# Patient Record
Sex: Female | Born: 1940
Health system: Southern US, Community
[De-identification: ages and names within clinical notes are randomized; demographics above are authoritative.]

## PROBLEM LIST (undated history)

## (undated) DIAGNOSIS — M1990 Unspecified osteoarthritis, unspecified site: Secondary | ICD-10-CM

## (undated) DIAGNOSIS — E785 Hyperlipidemia, unspecified: Secondary | ICD-10-CM

## (undated) DIAGNOSIS — Q179 Congenital malformation of ear, unspecified: Secondary | ICD-10-CM

## (undated) DIAGNOSIS — I10 Essential (primary) hypertension: Secondary | ICD-10-CM

## (undated) DIAGNOSIS — F0390 Unspecified dementia without behavioral disturbance: Secondary | ICD-10-CM

## (undated) HISTORY — DX: Hyperlipidemia, unspecified: E78.5

## (undated) HISTORY — PX: CYSTOSCOPY WITH STENT PLACEMENT: SHX5790

## (undated) HISTORY — PX: CHOLECYSTECTOMY: SHX55

## (undated) HISTORY — PX: APPENDECTOMY: SHX54

---

## 2005-04-20 LAB — HM PAP SMEAR: HM Pap smear: NORMAL

## 2005-05-13 ENCOUNTER — Ambulatory Visit: Payer: Self-pay | Admitting: Family Medicine

## 2005-05-19 ENCOUNTER — Ambulatory Visit: Payer: Self-pay | Admitting: Family Medicine

## 2005-12-29 ENCOUNTER — Ambulatory Visit: Payer: Self-pay | Admitting: General Surgery

## 2006-11-26 ENCOUNTER — Ambulatory Visit: Payer: Self-pay | Admitting: Family Medicine

## 2007-12-23 ENCOUNTER — Ambulatory Visit: Payer: Self-pay | Admitting: Family Medicine

## 2008-12-27 ENCOUNTER — Ambulatory Visit: Payer: Self-pay | Admitting: Family Medicine

## 2010-08-19 ENCOUNTER — Ambulatory Visit: Payer: Self-pay | Admitting: Family Medicine

## 2010-09-09 ENCOUNTER — Ambulatory Visit: Payer: Self-pay | Admitting: Family Medicine

## 2011-03-11 ENCOUNTER — Ambulatory Visit: Payer: Self-pay | Admitting: Family Medicine

## 2012-05-05 ENCOUNTER — Ambulatory Visit: Payer: Self-pay | Admitting: Family Medicine

## 2012-07-02 ENCOUNTER — Ambulatory Visit: Payer: Self-pay | Admitting: Family Medicine

## 2013-07-04 ENCOUNTER — Ambulatory Visit: Payer: Self-pay | Admitting: Family Medicine

## 2013-07-11 LAB — HM DEXA SCAN

## 2013-07-13 ENCOUNTER — Ambulatory Visit: Payer: Self-pay | Admitting: Family Medicine

## 2014-02-23 ENCOUNTER — Ambulatory Visit: Payer: Self-pay | Admitting: Family Medicine

## 2014-08-04 LAB — LIPID PANEL
CHOLESTEROL: 163 mg/dL (ref 0–200)
HDL: 59 mg/dL (ref 35–70)
LDL CALC: 81 mg/dL
LDl/HDL Ratio: 1.4
Triglycerides: 116 mg/dL (ref 40–160)

## 2014-08-04 LAB — HEPATIC FUNCTION PANEL
ALK PHOS: 42 U/L (ref 25–125)
ALT: 13 U/L (ref 7–35)
AST: 12 U/L — AB (ref 13–35)
BILIRUBIN, TOTAL: 0.2 mg/dL

## 2014-08-04 LAB — CBC AND DIFFERENTIAL
HCT: 38 % (ref 36–46)
Hemoglobin: 12.8 g/dL (ref 12.0–16.0)
Neutrophils Absolute: 63 /uL
PLATELETS: 194 10*3/uL (ref 150–399)
WBC: 7.7 10*3/mL

## 2014-08-04 LAB — BASIC METABOLIC PANEL
BUN: 36 mg/dL — AB (ref 4–21)
Creatinine: 1.6 mg/dL — AB (ref <=1.1)
Glucose: 123 mg/dL
POTASSIUM: 4.2 mmol/L (ref 3.4–5.3)
Sodium: 140 mmol/L (ref 137–147)

## 2014-08-04 LAB — TSH: TSH: 1.7 u[IU]/mL (ref <=5.90)

## 2014-08-14 ENCOUNTER — Ambulatory Visit: Payer: Self-pay | Admitting: Family Medicine

## 2014-10-04 ENCOUNTER — Ambulatory Visit: Payer: Self-pay | Admitting: Nephrology

## 2014-11-07 DIAGNOSIS — R634 Abnormal weight loss: Secondary | ICD-10-CM | POA: Insufficient documentation

## 2014-11-07 DIAGNOSIS — Z800 Family history of malignant neoplasm of digestive organs: Secondary | ICD-10-CM | POA: Diagnosis not present

## 2014-11-07 DIAGNOSIS — R1030 Lower abdominal pain, unspecified: Secondary | ICD-10-CM | POA: Diagnosis not present

## 2014-11-16 DIAGNOSIS — I10 Essential (primary) hypertension: Secondary | ICD-10-CM | POA: Diagnosis not present

## 2014-11-16 DIAGNOSIS — N183 Chronic kidney disease, stage 3 (moderate): Secondary | ICD-10-CM | POA: Diagnosis not present

## 2014-11-16 DIAGNOSIS — M1990 Unspecified osteoarthritis, unspecified site: Secondary | ICD-10-CM | POA: Diagnosis not present

## 2014-11-16 DIAGNOSIS — I714 Abdominal aortic aneurysm, without rupture: Secondary | ICD-10-CM | POA: Diagnosis not present

## 2014-11-16 DIAGNOSIS — I701 Atherosclerosis of renal artery: Secondary | ICD-10-CM | POA: Diagnosis not present

## 2014-11-21 ENCOUNTER — Ambulatory Visit: Payer: Self-pay | Admitting: Vascular Surgery

## 2014-11-21 DIAGNOSIS — F17200 Nicotine dependence, unspecified, uncomplicated: Secondary | ICD-10-CM | POA: Diagnosis not present

## 2014-11-21 DIAGNOSIS — I129 Hypertensive chronic kidney disease with stage 1 through stage 4 chronic kidney disease, or unspecified chronic kidney disease: Secondary | ICD-10-CM | POA: Diagnosis not present

## 2014-11-21 DIAGNOSIS — N289 Disorder of kidney and ureter, unspecified: Secondary | ICD-10-CM | POA: Diagnosis not present

## 2014-11-21 DIAGNOSIS — I701 Atherosclerosis of renal artery: Secondary | ICD-10-CM | POA: Diagnosis not present

## 2014-11-21 DIAGNOSIS — N183 Chronic kidney disease, stage 3 (moderate): Secondary | ICD-10-CM | POA: Diagnosis not present

## 2014-11-21 DIAGNOSIS — E780 Pure hypercholesterolemia: Secondary | ICD-10-CM | POA: Diagnosis not present

## 2014-11-21 DIAGNOSIS — I10 Essential (primary) hypertension: Secondary | ICD-10-CM | POA: Diagnosis not present

## 2014-11-21 LAB — BASIC METABOLIC PANEL
ANION GAP: 7 (ref 7–16)
BUN: 29 mg/dL — AB (ref 7–18)
CREATININE: 1.48 mg/dL — AB (ref 0.60–1.30)
Calcium, Total: 8.9 mg/dL (ref 8.5–10.1)
Chloride: 110 mmol/L — ABNORMAL HIGH (ref 98–107)
Co2: 24 mmol/L (ref 21–32)
EGFR (African American): 44 — ABNORMAL LOW
EGFR (Non-African Amer.): 37 — ABNORMAL LOW
Glucose: 91 mg/dL (ref 65–99)
Osmolality: 287 (ref 275–301)
POTASSIUM: 3.9 mmol/L (ref 3.5–5.1)
SODIUM: 141 mmol/L (ref 136–145)

## 2014-11-27 DIAGNOSIS — R399 Unspecified symptoms and signs involving the genitourinary system: Secondary | ICD-10-CM | POA: Diagnosis not present

## 2014-11-27 DIAGNOSIS — Z23 Encounter for immunization: Secondary | ICD-10-CM | POA: Diagnosis not present

## 2014-11-27 DIAGNOSIS — F17200 Nicotine dependence, unspecified, uncomplicated: Secondary | ICD-10-CM | POA: Diagnosis not present

## 2014-11-27 DIAGNOSIS — N390 Urinary tract infection, site not specified: Secondary | ICD-10-CM | POA: Diagnosis not present

## 2014-11-27 DIAGNOSIS — I10 Essential (primary) hypertension: Secondary | ICD-10-CM | POA: Diagnosis not present

## 2014-12-01 DIAGNOSIS — N183 Chronic kidney disease, stage 3 (moderate): Secondary | ICD-10-CM | POA: Diagnosis not present

## 2014-12-01 DIAGNOSIS — I10 Essential (primary) hypertension: Secondary | ICD-10-CM | POA: Diagnosis not present

## 2014-12-25 DIAGNOSIS — I701 Atherosclerosis of renal artery: Secondary | ICD-10-CM | POA: Diagnosis not present

## 2014-12-25 DIAGNOSIS — I150 Renovascular hypertension: Secondary | ICD-10-CM | POA: Diagnosis not present

## 2014-12-25 DIAGNOSIS — I714 Abdominal aortic aneurysm, without rupture: Secondary | ICD-10-CM | POA: Diagnosis not present

## 2014-12-25 DIAGNOSIS — E785 Hyperlipidemia, unspecified: Secondary | ICD-10-CM | POA: Diagnosis not present

## 2015-01-01 DIAGNOSIS — F329 Major depressive disorder, single episode, unspecified: Secondary | ICD-10-CM | POA: Diagnosis not present

## 2015-01-01 DIAGNOSIS — M810 Age-related osteoporosis without current pathological fracture: Secondary | ICD-10-CM | POA: Diagnosis not present

## 2015-01-01 DIAGNOSIS — F411 Generalized anxiety disorder: Secondary | ICD-10-CM | POA: Diagnosis not present

## 2015-01-01 DIAGNOSIS — E780 Pure hypercholesterolemia: Secondary | ICD-10-CM | POA: Diagnosis not present

## 2015-01-01 DIAGNOSIS — I10 Essential (primary) hypertension: Secondary | ICD-10-CM | POA: Diagnosis not present

## 2015-02-02 DIAGNOSIS — I10 Essential (primary) hypertension: Secondary | ICD-10-CM | POA: Diagnosis not present

## 2015-02-02 DIAGNOSIS — N183 Chronic kidney disease, stage 3 (moderate): Secondary | ICD-10-CM | POA: Diagnosis not present

## 2015-02-25 NOTE — Op Note (Signed)
PATIENT NAME:  Sarah [name], Sarah [name] MR#:  [id number] DATE OF BIRTH:  1941/05/25  DATE OF PROCEDURE:  11/21/2014  PREOPERATIVE DIAGNOSES: 1.  Renal insufficiency.  2.  Hypertension.  3.  Renal artery stenosis.   POSTOPERATIVE DIAGNOSES:   1.  Renal insufficiency.  2.  Hypertension.  3.  Renal artery stenosis.   PROCEDURES PERFORMED:   1.  Abdominal aortogram.  2.  Right selective renal artery angiography.  3.  Percutaneous transluminal angioplasty and stent placement, right renal artery using a 4 x 15 Herculink stent post dilated to 5 mm.   SURGEON:  Katha Cabal, MD   SEDATION: Versed plus fentanyl. Continuous ECG, pulse oximetry and cardiopulmonary monitoring is performed throughout the entire procedure by the interventional radiology nurse, total sedation time is in 1 hour.   ACCESS: A 6 French sheath, right common femoral artery.   FLUOROSCOPY TIME: Was 10.5 minutes.   CONTRAST USED:  Isovue 45 mL.   INDICATIONS: Sarah [name] is a 73 year old woman who has experienced an episode of acute renal insufficiency. She has subsequently recovered the majority of her renal function, but has very mild to modest impairment. She also has chronic hypertension. Work-up of her episode demonstrated right renal artery stenosis. Risks and benefits have been reviewed for renal artery angiography and intervention. All questions answered. The patient has agreed to proceed.   DESCRIPTION OF PROCEDURE: The patient is taken to special procedures and placed in the supine position and after adequate sedation is achieved, both groins are prepped and draped in sterile fashion. Appropriate timeout is called.   Then 1% lidocaine is infiltrated in the soft tissue. Ultrasound is placed in a sterile sleeve. Common femoral artery is identified. It is echolucent and pulsatile indicating patency. Image is recorded for the permanent record. Under real-time visualization, access is obtained with a micropuncture needle,  microwire followed by micro sheath, J-wire followed by a 5 French sheath and 5 French pigtail catheter.   The pigtail catheter is positioned at the level of T12, and AP projection of the aorta is obtained. Bilateral renal arteries are noted, appears to be moderate to perhaps severe stenosis of the left renal artery; right renal artery is poorly visualized at this angle, and subsequently, a VS1 catheter is utilized to select the origin, which is actually somewhat dilated. Once this has been engaged a steep LAO projection is obtained, which splays out the renal artery quite nicely demonstrating there is a string sign approximately 1.5 to 1 cm distal to the origin. Beyond that stenosis the renal artery appears quite normal.   Subsequently the Glidewire and VS1 catheter are used to select the renal artery. The VS1 catheter is seated within the renal artery and subsequently a more formal renal artery angiography is obtained with several mL of contrast. A Magic torque wire is then inserted and the VS1 catheter is removed. Subsequently, a Thiells guiding sheath is advanced up and over the wire. A straight glide catheter is then advanced over the wire and the Magic torque wire exchanged for an 0.014 Spartacore. A 4 x 15 Herculink stent is then selected and advanced over the wire, across the lesion, 2 small puffs of contrast were used to position the stent and then stent was deployed without difficulty.   Follow-up imaging demonstrated excellent positioning of the stent, but somewhat under dilated  and therefore a 5 x 2 balloon was advanced over the wire, across the stent and a second inflation was made to 8 atmospheres  for approximately 30 seconds.   Follow-up imaging now demonstrates again the stent is in very good position; it was now more appropriately expanded. There is rapid flow of contrast filling the right kidney now. Nephrogram appears normal.   Guiding catheter wire is removed. Magic torque wire is advanced  through the sheath. Oblique view of the right groin is obtained. A StarClose device is deployed. There are no immediate complications.   INTERPRETATION: Initial views of the abdominal aorta demonstrate aneurysmal dilatation; there are diffuse calcifications and atherosclerotic changes but no hemodynamically significant stenoses. Left renal artery demonstrates approximately 50% to 60% stenosis, may be more based on the appearance, very calcified. The right renal artery demonstrates moderate aneurysmal dilatation, if you will, at the origin; however, within several mm, there is a string sign and subsequently normal renal artery distal to this.   After selecting the renal artery, stent is advanced across the lesion dilated. Follow-up imaging suggested that it is undersized and therefore it is post dilated to 5 mm. Follow-up imaging now demonstrates a well-positioned stent with good apposition to the wall.   SUMMARY: Successful angioplasty and stent placement, right renal artery.    ____________________________ Katha Cabal, MD ggs:nt D: 11/21/2014 17:01:28 ET T: 11/21/2014 17:43:39 ET JOB#: [id number]  cc: Katha Cabal, MD, <Dictator> Murlean Iba, MD Richard L. Rosanna Randy, [place] MD ELECTRONICALLY SIGNED 11/29/2014 17:43

## 2015-02-26 DIAGNOSIS — R413 Other amnesia: Secondary | ICD-10-CM | POA: Diagnosis not present

## 2015-02-26 DIAGNOSIS — E559 Vitamin D deficiency, unspecified: Secondary | ICD-10-CM | POA: Diagnosis not present

## 2015-02-26 DIAGNOSIS — M1990 Unspecified osteoarthritis, unspecified site: Secondary | ICD-10-CM | POA: Diagnosis not present

## 2015-02-26 DIAGNOSIS — F329 Major depressive disorder, single episode, unspecified: Secondary | ICD-10-CM | POA: Diagnosis not present

## 2015-02-26 DIAGNOSIS — F411 Generalized anxiety disorder: Secondary | ICD-10-CM | POA: Diagnosis not present

## 2015-03-05 DIAGNOSIS — E559 Vitamin D deficiency, unspecified: Secondary | ICD-10-CM | POA: Insufficient documentation

## 2015-03-05 DIAGNOSIS — M1990 Unspecified osteoarthritis, unspecified site: Secondary | ICD-10-CM | POA: Insufficient documentation

## 2015-03-05 DIAGNOSIS — F411 Generalized anxiety disorder: Secondary | ICD-10-CM | POA: Insufficient documentation

## 2015-03-05 DIAGNOSIS — E785 Hyperlipidemia, unspecified: Secondary | ICD-10-CM | POA: Insufficient documentation

## 2015-03-05 DIAGNOSIS — R0989 Other specified symptoms and signs involving the circulatory and respiratory systems: Secondary | ICD-10-CM | POA: Insufficient documentation

## 2015-03-05 DIAGNOSIS — F17200 Nicotine dependence, unspecified, uncomplicated: Secondary | ICD-10-CM | POA: Insufficient documentation

## 2015-03-05 DIAGNOSIS — J309 Allergic rhinitis, unspecified: Secondary | ICD-10-CM | POA: Insufficient documentation

## 2015-03-05 DIAGNOSIS — M419 Scoliosis, unspecified: Secondary | ICD-10-CM | POA: Insufficient documentation

## 2015-03-05 DIAGNOSIS — F329 Major depressive disorder, single episode, unspecified: Secondary | ICD-10-CM | POA: Insufficient documentation

## 2015-03-05 DIAGNOSIS — R413 Other amnesia: Secondary | ICD-10-CM | POA: Insufficient documentation

## 2015-03-05 DIAGNOSIS — M810 Age-related osteoporosis without current pathological fracture: Secondary | ICD-10-CM | POA: Insufficient documentation

## 2015-03-05 DIAGNOSIS — I150 Renovascular hypertension: Secondary | ICD-10-CM | POA: Insufficient documentation

## 2015-03-05 DIAGNOSIS — K219 Gastro-esophageal reflux disease without esophagitis: Secondary | ICD-10-CM | POA: Insufficient documentation

## 2015-03-05 DIAGNOSIS — R634 Abnormal weight loss: Secondary | ICD-10-CM | POA: Insufficient documentation

## 2015-04-12 ENCOUNTER — Other Ambulatory Visit: Payer: Self-pay | Admitting: Family Medicine

## 2015-05-02 ENCOUNTER — Encounter: Payer: Self-pay | Admitting: Family Medicine

## 2015-05-02 ENCOUNTER — Ambulatory Visit (INDEPENDENT_AMBULATORY_CARE_PROVIDER_SITE_OTHER): Payer: Medicare Other | Admitting: Family Medicine

## 2015-05-02 VITALS — BP 160/78 | HR 80 | Temp 97.7°F | Resp 16 | Ht 58.25 in | Wt 88.4 lb

## 2015-05-02 DIAGNOSIS — R634 Abnormal weight loss: Secondary | ICD-10-CM | POA: Diagnosis not present

## 2015-05-02 DIAGNOSIS — F329 Major depressive disorder, single episode, unspecified: Secondary | ICD-10-CM

## 2015-05-02 DIAGNOSIS — F419 Anxiety disorder, unspecified: Secondary | ICD-10-CM

## 2015-05-02 DIAGNOSIS — F32A Depression, unspecified: Secondary | ICD-10-CM

## 2015-05-02 MED ORDER — MIRTAZAPINE 30 MG PO TABS: 30.0000 mg | ORAL_TABLET | Freq: Every day | ORAL | Status: DC

## 2015-05-02 MED ORDER — LORAZEPAM 1 MG PO TABS: ORAL_TABLET | ORAL | Status: DC

## 2015-05-02 NOTE — Progress Notes (Signed)
Patient ID: Sarah [name], female   DOB: 1941-03-20, 74 y.o.   MRN: [id number].       Patient: Sarah [name] Female    DOB: 03-18-1941   74 y.o.   MRN: [id number] Visit Date: 05/02/2015  Today's Provider: Wilhemena Durie, MD   Chief Complaint  Patient presents with  . Depression    follow-up MDD  . Weight Loss    follow-up Unintensional weight loss   Subjective:     Office Visit  02/26/15 MDD try wellbutrin, did not tolerate citalopram. May need to add Trazodone  Unintentional weight loss. GAD - advised to try cut down on use of Lorazepam.     HPI She does not feel any better on Wellbutrin and actually feels worse trying to cut down on lorazepam. He is probably been on this for 30 years. She feels as though she eats okay. Appetite is fine per patient.   Previous Medications   ALENDRONATE (FOSAMAX) 10 MG TABLET    Take by mouth.   AMLODIPINE (NORVASC) 10 MG TABLET    Take by mouth.   BUPROPION (WELLBUTRIN XL) 150 MG 24 HR TABLET    Take by mouth.   CALCIUM CARBONATE-VITAMIN D PO    Take by mouth.   CHOLECALCIFEROL (VITAMIN D3) 1000 UNITS CAPS    Take by mouth.   CHOLECALCIFEROL 1000 UNITS TABLET    Take by mouth.   CLOPIDOGREL (PLAVIX) 75 MG TABLET    Take by mouth.   CYANOCOBALAMIN 100 MCG TABLET    Take by mouth.   LISINOPRIL (PRINIVIL,ZESTRIL) 40 MG TABLET    Take by mouth.   LORAZEPAM (ATIVAN) 1 MG TABLET    Take by mouth.   LOVASTATIN (MEVACOR) 40 MG TABLET    Take by mouth.   MECLIZINE (ANTIVERT) 25 MG TABLET    Take by mouth.   MECLIZINE (ANTIVERT) 25 MG TABLET    Take by mouth.   MELOXICAM (MOBIC) 7.5 MG TABLET    TAKE ONE TABLET BY MOUTH ONCE DAILY AS NEEDED   NON FORMULARY    Take by mouth.   OMEPRAZOLE (PRILOSEC) 20 MG CAPSULE    Take by mouth.   PANTOPRAZOLE (PROTONIX) 40 MG TABLET    Take by mouth.   VITAMIN E 100 UNIT CAPSULE    Take by mouth.   VITAMIN E 100 UNITS TABS    Take by mouth.    Review of Systems  Constitutional: Negative.     HENT: Negative.   Eyes: Positive for visual disturbance.       To see close"  Respiratory: Positive for cough.   Cardiovascular: Negative.   Gastrointestinal: Negative.   Endocrine: Positive for heat intolerance.  Genitourinary: Negative.   Musculoskeletal:       Left shoulder, arthritis  Skin: Negative.   Allergic/Immunologic: Negative.   Neurological: Positive for dizziness.  Hematological: Bruises/bleeds easily.  Psychiatric/Behavioral: The patient is nervous/anxious.     History  Substance Use Topics  . Smoking status: Current Every Day Smoker -- 0.50 packs/day    Types: Cigarettes  . Smokeless tobacco: Not on file  . Alcohol Use: No   Objective:   BP 160/78 mmHg  Pulse 80  Temp(Src) 97.7 F (36.5 C) (Oral)  Resp 16  Ht 4' 10.25" (1.48 m)  Wt 88 lb 6.4 oz (40.098 kg)  BMI 18.31 kg/m2  Physical Exam  Constitutional: She is oriented to person, place, and time. She appears well-developed.  Cachectic appearing Latino female  in no acute distress.  HENT:  Head: Normocephalic and atraumatic.  Right Ear: External ear normal.  Left Ear: External ear normal.  Nose: Nose normal.  Mouth/Throat: Oropharynx is clear and moist.  Eyes: Conjunctivae and EOM are normal. Pupils are equal, round, and reactive to light.  Neck: Normal range of motion. Neck supple.  Cardiovascular: Normal rate, regular rhythm, normal heart sounds and intact distal pulses.   Pulmonary/Chest: Effort normal and breath sounds normal.  Abdominal: Soft. Bowel sounds are normal.  Neurological: She is alert and oriented to person, place, and time. She has normal reflexes.  Skin: Skin is warm and dry.  Psychiatric: She has a normal mood and affect. Her behavior is normal. Judgment normal.        Assessment & Plan:     Follow up:   1. Depression Refill provided. - LORazepam (ATIVAN) 1 MG tablet; Take 1/2 tablet twice daily  Dispense: 30 tablet; Refill: 5 Switch to mirtazapine 30 mg daily area stop  Wellbutrin. 2. Weight loss, unintentional Start Mirtazapine. 3 lbs weight loss since may.  - mirtazapine (REMERON) 30 MG tablet; Take 1 tablet (30 mg total) by mouth at bedtime.  Dispense: 30 tablet; Refill: 5  3. Acute anxiety Stable. - LORazepam (ATIVAN) 1 MG tablet; Take 1/2 tablet twice daily  Dispense: 30 tablet; Refill: 5 Obtain MMSE on next visit as I have difficulty signing if she has chronic anxiety or some memory loss. 4. Tobacco abuse Patient is strongly encouraged to discontinue this.      I have done the exam and reviewed the above chart and it is accurate to the best of my knowledge.   Richard Cranford Mon, MD  [facility]

## 2015-05-29 ENCOUNTER — Encounter: Payer: Self-pay | Admitting: Family Medicine

## 2015-05-29 ENCOUNTER — Ambulatory Visit (INDEPENDENT_AMBULATORY_CARE_PROVIDER_SITE_OTHER): Payer: Medicare Other | Admitting: Family Medicine

## 2015-05-29 VITALS — BP 110/62 | HR 62 | Temp 97.5°F | Resp 16 | Wt 87.0 lb

## 2015-05-29 DIAGNOSIS — R413 Other amnesia: Secondary | ICD-10-CM

## 2015-05-29 DIAGNOSIS — F0390 Unspecified dementia without behavioral disturbance: Secondary | ICD-10-CM | POA: Diagnosis not present

## 2015-05-29 DIAGNOSIS — F329 Major depressive disorder, single episode, unspecified: Secondary | ICD-10-CM | POA: Insufficient documentation

## 2015-05-29 DIAGNOSIS — F411 Generalized anxiety disorder: Secondary | ICD-10-CM | POA: Diagnosis not present

## 2015-05-29 DIAGNOSIS — E785 Hyperlipidemia, unspecified: Secondary | ICD-10-CM

## 2015-05-29 DIAGNOSIS — F32A Depression, unspecified: Secondary | ICD-10-CM

## 2015-05-29 DIAGNOSIS — K219 Gastro-esophageal reflux disease without esophagitis: Secondary | ICD-10-CM

## 2015-05-29 DIAGNOSIS — Z720 Tobacco use: Secondary | ICD-10-CM | POA: Diagnosis not present

## 2015-05-29 DIAGNOSIS — F17200 Nicotine dependence, unspecified, uncomplicated: Secondary | ICD-10-CM

## 2015-05-29 DIAGNOSIS — I150 Renovascular hypertension: Secondary | ICD-10-CM | POA: Diagnosis not present

## 2015-05-29 MED ORDER — DONEPEZIL HCL 5 MG PO TABS: 5.0000 mg | ORAL_TABLET | Freq: Every day | ORAL | Status: DC

## 2015-05-29 NOTE — Progress Notes (Signed)
Patient ID: Sarah [name], female   DOB: 1941/08/23, 74 y.o.   MRN: [id number]    Subjective:  HPI Pt is here to follow up for depression and memory loss. LOV d/c Wellbutrin and started Mirtazapine. [place] son reports that her memory is still about the same even taking the Mirtazapine. She is taking it every night and not having any side effects that she knows of. She reports that she is sleeping better at night.   Cognitive Testing - 6-CIT  Correct? Score   What year is it? yes 0 0 or 4  What month is it? yes 0 0 or 3  Memorize:    Sarah [name],  40,  [place],      What time is it? (within 1 hour) yes 0 0 or 3  Count backwards from 20 no 2 0, 2, or 4  Name the months of the year no 2 0, 2, or 4  Repeat name & address above no 10 0, 2, 4, 6, 8, or 10       TOTAL SCORE  14/28   Interpretation:  Abnormal- cognitive impairment  Normal (0-7) Abnormal (8-28)      Prior to Admission medications   Medication Sig Start Date End Date Taking? Authorizing Provider  alendronate (FOSAMAX) 10 MG tablet Take by mouth. 01/01/15  Yes Historical Provider, MD  amLODipine (NORVASC) 10 MG tablet Take by mouth. 08/01/14  Yes Historical Provider, MD  CALCIUM CARBONATE-VITAMIN D PO Take by mouth.   Yes Historical Provider, MD  Cholecalciferol (VITAMIN D3) 1000 UNITS CAPS Take by mouth.   Yes Historical Provider, MD  clopidogrel (PLAVIX) 75 MG tablet Take by mouth.   Yes Historical Provider, MD  cyanocobalamin 100 MCG tablet Take by mouth.   Yes Historical Provider, MD  lisinopril (PRINIVIL,ZESTRIL) 40 MG tablet Take by mouth. 08/01/14  Yes Historical Provider, MD  LORazepam (ATIVAN) 1 MG tablet Take 1/2 tablet twice daily 05/02/15  Yes Khris Jansson Maceo Pro., MD  lovastatin (MEVACOR) 40 MG tablet Take by mouth. 08/01/14  Yes Historical Provider, MD  meclizine (ANTIVERT) 25 MG tablet Take by mouth. 03/05/14  Yes Historical Provider, MD  meloxicam (MOBIC) 7.5 MG tablet TAKE ONE TABLET BY MOUTH ONCE  DAILY AS NEEDED 04/12/15  Yes Venus Gilles Maceo Pro., MD  mirtazapine (REMERON) 30 MG tablet Take 1 tablet (30 mg total) by mouth at bedtime. 05/02/15  Yes Rayshard Schirtzinger Maceo Pro., MD  NON FORMULARY Take by mouth.   Yes Historical Provider, MD  omeprazole (PRILOSEC) 20 MG capsule Take by mouth.   Yes Historical Provider, MD  pantoprazole (PROTONIX) 40 MG tablet Take by mouth. 01/01/15  Yes Historical Provider, MD  vitamin E 100 UNIT capsule Take by mouth.   Yes Historical Provider, MD  Vitamin E 100 UNITS TABS Take by mouth.    Historical Provider, MD    Patient Active Problem List   Diagnosis Date Noted  . Depression 05/29/2015  . Allergic rhinitis 03/05/2015  . Weak pulse 03/05/2015  . Smokes tobacco daily 03/05/2015  . Anxiety, generalized 03/05/2015  . Acid reflux 03/05/2015  . HLD (hyperlipidemia) 03/05/2015  . Affective disorder, major 03/05/2015  . Bad memory 03/05/2015  . Arthritis, degenerative 03/05/2015  . OP (osteoporosis) 03/05/2015  . Hypertension, renovascular 03/05/2015  . Scoliosis 03/05/2015  . Avitaminosis D 03/05/2015  . Weight loss 03/05/2015  . Family history of colon cancer 11/07/2014    History reviewed. No pertinent past medical history.  History   Social History  . Marital Status: Widowed    Spouse Name: N/A  . Number of Children: N/A  . Years of Education: N/A   Occupational History  . Not on file.   Social History Main Topics  . Smoking status: Current Every Day Smoker -- 0.50 packs/day    Types: Cigarettes  . Smokeless tobacco: Not on file  . Alcohol Use: No  . Drug Use: No  . Sexual Activity: Not on file   Other Topics Concern  . Not on file   Social History Narrative    Allergies  Allergen Reactions  . Penicillins Hives    Review of Systems  Constitutional: Negative.   HENT: Negative.   Eyes: Negative.   Respiratory: Negative.   Cardiovascular: Negative.   Gastrointestinal: Negative.   Genitourinary: Negative.     Musculoskeletal: Negative.   Skin: Negative.   Neurological:       Memory loss  Endo/Heme/Allergies: Negative.   Psychiatric/Behavioral: Positive for depression. The patient is nervous/anxious.     Immunization History  Administered Date(s) Administered  . Pneumococcal Conjugate-13 08/01/2014  . Pneumococcal Polysaccharide-23 03/01/2013  . Td 02/10/1996   Objective:  BP 110/62 mmHg  Pulse 62  Temp(Src) 97.5 F (36.4 C) (Oral)  Resp 16  Wt 87 lb (39.463 kg)  Physical Exam  Constitutional: She is oriented to person, place, and time and well-developed, well-nourished, and in no distress.  HENT:  Head: Normocephalic and atraumatic.  Right Ear: External ear normal.  Left Ear: External ear normal.  Nose: Nose normal.  Eyes: Conjunctivae and EOM are normal. Pupils are equal, round, and reactive to light.  Neck: Normal range of motion. Neck supple.  Cardiovascular: Normal rate, regular rhythm, normal heart sounds and intact distal pulses.   Pulmonary/Chest: Effort normal and breath sounds normal.  Neurological: She is alert and oriented to person, place, and time. She has normal reflexes. Gait normal. GCS score is 15.  Skin: Skin is warm and dry.  Psychiatric: Mood, affect and judgment normal.    Lab Results  Component Value Date   WBC 7.7 08/04/2014   HGB 12.8 08/04/2014   HCT 38 08/04/2014   PLT 194 08/04/2014   GLUCOSE 91 11/21/2014   CHOL 163 08/04/2014   TRIG 116 08/04/2014   HDL 59 08/04/2014   LDLCALC 81 08/04/2014   TSH 1.70 08/04/2014    CMP     Component Value Date/Time   NA 141 11/21/2014 1234   NA 140 08/04/2014   K 3.9 11/21/2014 1234   K 4.2 08/04/2014   CL 110* 11/21/2014 1234   CO2 24 11/21/2014 1234   GLUCOSE 91 11/21/2014 1234   BUN 29* 11/21/2014 1234   BUN 36* 08/04/2014   CREATININE 1.48* 11/21/2014 1234   CREATININE 1.6* 08/04/2014   CALCIUM 8.9 11/21/2014 1234   AST 12* 08/04/2014   ALT 13 08/04/2014   ALKPHOS 42 08/04/2014     Assessment and Plan :  1. Anxiety, generalized  - TSH  2. Depression   3. Dementia, without behavioral disturbance Mild cognitive impairment/early Alzheimer's disease. Sarah [name] is with patient today and he is a Equities trader in college. I tried to give him full understanding of natural progression of this disease. I think she is still safe for the time being but she needs to be monitored.  On the next visit may require patient to go to do to have driving evaluated. He only drives about a quarter mile now to go  to [place]. I said no driving beyond that.  - donepezil (ARICEPT) 5 MG tablet; Take 1 tablet (5 mg total) by mouth at bedtime.  Dispense: 30 tablet; Refill: 12 - Vitamin B12  4. Hypertension, renovascular  - CBC with Differential/Platelet - TSH  5. Gastroesophageal reflux disease, esophagitis presence not specified   6. Smokes tobacco daily strongly advised to quit smoking.  7. HLD (hyperlipidemia)  - COMPLETE METABOLIC PANEL WITH GFR - Lipid Panel With LDL/HDL Ratio  8. Memory loss  - Vitamin B12   Patient was seen and examined by Dr. Miguel Aschoff, and noted scribed by Webb Laws, [place] MD [facility] 05/29/2015 2:07 PM

## 2015-05-30 DIAGNOSIS — F411 Generalized anxiety disorder: Secondary | ICD-10-CM | POA: Diagnosis not present

## 2015-05-30 DIAGNOSIS — F0390 Unspecified dementia without behavioral disturbance: Secondary | ICD-10-CM | POA: Diagnosis not present

## 2015-05-30 DIAGNOSIS — E785 Hyperlipidemia, unspecified: Secondary | ICD-10-CM | POA: Diagnosis not present

## 2015-05-30 DIAGNOSIS — I150 Renovascular hypertension: Secondary | ICD-10-CM | POA: Diagnosis not present

## 2015-05-30 DIAGNOSIS — R413 Other amnesia: Secondary | ICD-10-CM | POA: Diagnosis not present

## 2015-05-31 LAB — CBC WITH DIFFERENTIAL/PLATELET
BASOS ABS: 0.1 10*3/uL (ref 0.0–0.2)
Basos: 1 %
EOS (ABSOLUTE): 0.4 10*3/uL (ref 0.0–0.4)
Eos: 3 %
HEMATOCRIT: 39.3 % (ref 34.0–46.6)
HEMOGLOBIN: 12.4 g/dL (ref 11.1–15.9)
Immature Grans (Abs): 0 10*3/uL (ref 0.0–0.1)
Immature Granulocytes: 0 %
Lymphocytes Absolute: 1.8 10*3/uL (ref 0.7–3.1)
Lymphs: 14 %
MCH: 29.8 pg (ref 26.6–33.0)
MCHC: 31.6 g/dL (ref 31.5–35.7)
MCV: 95 fL (ref 79–97)
Monocytes Absolute: 1 10*3/uL — ABNORMAL HIGH (ref 0.1–0.9)
Monocytes: 7 %
Neutrophils Absolute: 10.2 10*3/uL — ABNORMAL HIGH (ref 1.4–7.0)
Neutrophils: 75 %
PLATELETS: 221 10*3/uL (ref 150–379)
RBC: 4.16 x10E6/uL (ref 3.77–5.28)
RDW: 14.1 % (ref 12.3–15.4)
WBC: 13.4 10*3/uL — AB (ref 3.4–10.8)

## 2015-05-31 LAB — COMPREHENSIVE METABOLIC PANEL
A/G RATIO: 1.5 (ref 1.1–2.5)
ALT: 6 IU/L (ref 0–32)
AST: 8 IU/L (ref 0–40)
Albumin: 3.9 g/dL (ref 3.5–4.8)
Alkaline Phosphatase: 55 IU/L (ref 39–117)
BUN/Creatinine Ratio: 20 (ref 11–26)
BUN: 40 mg/dL — AB (ref 8–27)
Bilirubin Total: 0.2 mg/dL (ref 0.0–1.2)
CHLORIDE: 108 mmol/L (ref 97–108)
CO2: 16 mmol/L — AB (ref 18–29)
Calcium: 9 mg/dL (ref 8.7–10.3)
Creatinine, Ser: 2.05 mg/dL — ABNORMAL HIGH (ref 0.57–1.00)
GFR calc Af Amer: 27 mL/min/{1.73_m2} — ABNORMAL LOW (ref >59)
GFR, EST NON AFRICAN AMERICAN: 23 mL/min/{1.73_m2} — AB (ref >59)
Globulin, Total: 2.6 g/dL (ref 1.5–4.5)
Glucose: 98 mg/dL (ref 65–99)
POTASSIUM: 4.2 mmol/L (ref 3.5–5.2)
Sodium: 144 mmol/L (ref 134–144)
Total Protein: 6.5 g/dL (ref 6.0–8.5)

## 2015-05-31 LAB — LIPID PANEL WITH LDL/HDL RATIO
CHOLESTEROL TOTAL: 165 mg/dL (ref 100–199)
HDL: 51 mg/dL (ref >39)
LDL Calculated: 91 mg/dL (ref 0–99)
LDl/HDL Ratio: 1.8 ratio units (ref 0.0–3.2)
TRIGLYCERIDES: 115 mg/dL (ref 0–149)
VLDL Cholesterol Cal: 23 mg/dL (ref 5–40)

## 2015-05-31 LAB — TSH: TSH: 1.24 u[IU]/mL (ref 0.450–4.500)

## 2015-05-31 LAB — VITAMIN B12: Vitamin B-12: >2000 pg/mL — ABNORMAL HIGH (ref 211–946)

## 2015-06-25 DIAGNOSIS — N179 Acute kidney failure, unspecified: Secondary | ICD-10-CM | POA: Diagnosis not present

## 2015-06-25 DIAGNOSIS — I10 Essential (primary) hypertension: Secondary | ICD-10-CM | POA: Diagnosis not present

## 2015-06-25 DIAGNOSIS — I701 Atherosclerosis of renal artery: Secondary | ICD-10-CM | POA: Diagnosis not present

## 2015-06-25 DIAGNOSIS — N183 Chronic kidney disease, stage 3 (moderate): Secondary | ICD-10-CM | POA: Diagnosis not present

## 2015-06-28 DIAGNOSIS — I150 Renovascular hypertension: Secondary | ICD-10-CM | POA: Diagnosis not present

## 2015-06-28 DIAGNOSIS — I701 Atherosclerosis of renal artery: Secondary | ICD-10-CM | POA: Diagnosis not present

## 2015-06-28 DIAGNOSIS — I714 Abdominal aortic aneurysm, without rupture: Secondary | ICD-10-CM | POA: Diagnosis not present

## 2015-06-28 DIAGNOSIS — I10 Essential (primary) hypertension: Secondary | ICD-10-CM | POA: Diagnosis not present

## 2015-07-30 ENCOUNTER — Ambulatory Visit (INDEPENDENT_AMBULATORY_CARE_PROVIDER_SITE_OTHER): Payer: Medicare Other | Admitting: Family Medicine

## 2015-07-30 VITALS — BP 168/66 | HR 72 | Temp 97.8°F | Resp 14 | Wt 88.0 lb

## 2015-07-30 DIAGNOSIS — F0390 Unspecified dementia without behavioral disturbance: Secondary | ICD-10-CM | POA: Diagnosis not present

## 2015-07-30 DIAGNOSIS — F32A Depression, unspecified: Secondary | ICD-10-CM

## 2015-07-30 DIAGNOSIS — Z720 Tobacco use: Secondary | ICD-10-CM | POA: Diagnosis not present

## 2015-07-30 DIAGNOSIS — F17200 Nicotine dependence, unspecified, uncomplicated: Secondary | ICD-10-CM

## 2015-07-30 DIAGNOSIS — F329 Major depressive disorder, single episode, unspecified: Secondary | ICD-10-CM

## 2015-07-30 DIAGNOSIS — F411 Generalized anxiety disorder: Secondary | ICD-10-CM | POA: Diagnosis not present

## 2015-07-30 NOTE — Progress Notes (Signed)
Patient ID: Sarah [name], female   DOB: [birth year], 74 y.o.   MRN: [id number]    Subjective:  HPI  MCI follow up: Patient was last seen on August 8th and 6CIT was done which was 14/28 and today score is 12/28. She was put on Aricept. She states her memory is a little better. She seem to tolerate medication ok. Her weight is table at 88 lbs. She gained 1 lb since last visit.  Cognitive Testing - 6-CIT  Correct? Score   What year is it? yes 0 0 or 4  What month is it? yes 0 0 or 3  Memorize:    [name],  40,  [place],      What time is it? (within 1 hour) yes 0 0 or 3  Count backwards from 20 yes 0 0, 2, or 4  Name the months of the year no 2 0, 2, or 4  Repeat name & address above no 10 0, 2, 4, 6, 8, or 10       TOTAL SCORE  12/28   Interpretation:  Abnormal- 12/28  Normal (0-7) Abnormal (8-28)    Prior to Admission medications   Medication Sig Start Date End Date Taking? Authorizing Provider  amLODipine (NORVASC) 10 MG tablet Take by mouth. 08/01/14  Yes Historical Provider, MD  CALCIUM CARBONATE-VITAMIN D PO Take by mouth.   Yes Historical Provider, MD  Cholecalciferol (VITAMIN D3) 1000 UNITS CAPS Take by mouth.   Yes Historical Provider, MD  cyanocobalamin 100 MCG tablet Take by mouth.   Yes Historical Provider, MD  donepezil (ARICEPT) 5 MG tablet Take 1 tablet (5 mg total) by mouth at bedtime. 05/29/15  Yes Neils Siracusa Maceo Pro., MD  lisinopril (PRINIVIL,ZESTRIL) 40 MG tablet Take by mouth. 08/01/14  Yes Historical Provider, MD  LORazepam (ATIVAN) 1 MG tablet Take 1/2 tablet twice daily 05/02/15  Yes Mabeline Varas Maceo Pro., MD  lovastatin (MEVACOR) 40 MG tablet Take by mouth. 08/01/14  Yes Historical Provider, MD  meloxicam (MOBIC) 7.5 MG tablet TAKE ONE TABLET BY MOUTH ONCE DAILY AS NEEDED 04/12/15  Yes Orion Vandervort Maceo Pro., MD  mirtazapine (REMERON) 30 MG tablet Take 1 tablet (30 mg total) by mouth at bedtime. 05/02/15  Yes Kymia Simi Maceo Pro., MD    pantoprazole (PROTONIX) 40 MG tablet Take by mouth. 01/01/15  Yes Historical Provider, MD  Vitamin E 100 UNITS TABS Take by mouth.   Yes Historical Provider, MD    Patient Active Problem List   Diagnosis Date Noted  . Depression 05/29/2015  . Dementia 05/29/2015  . Allergic rhinitis 03/05/2015  . Weak pulse 03/05/2015  . Smokes tobacco daily 03/05/2015  . Anxiety, generalized 03/05/2015  . Acid reflux 03/05/2015  . HLD (hyperlipidemia) 03/05/2015  . Affective disorder, major ([place]) 03/05/2015  . Bad memory 03/05/2015  . Arthritis, degenerative 03/05/2015  . OP (osteoporosis) 03/05/2015  . Hypertension, renovascular 03/05/2015  . Scoliosis 03/05/2015  . Avitaminosis D 03/05/2015  . Weight loss 03/05/2015  . Family history of colon cancer 11/07/2014    No past medical history on file.  Social History   Social History  . Marital Status: Widowed    Spouse Name: N/A  . Number of Children: N/A  . Years of Education: N/A   Occupational History  . Not on file.   Social History Main Topics  . Smoking status: Current Every Day Smoker -- 0.50 packs/day    Types: Cigarettes  .  Smokeless tobacco: Not on file  . Alcohol Use: No  . Drug Use: No  . Sexual Activity: Not on file   Other Topics Concern  . Not on file   Social History Narrative    Allergies  Allergen Reactions  . Penicillins Hives    Review of Systems  Constitutional: Negative.   HENT: Negative.   Eyes: Negative.   Respiratory: Negative.   Cardiovascular: Negative.   Gastrointestinal: Negative.   Musculoskeletal: Positive for joint pain.  Skin: Negative.   Neurological: Negative.   Endo/Heme/Allergies: Negative.   Psychiatric/Behavioral: Positive for depression. The patient is nervous/anxious and has insomnia.     Immunization History  Administered Date(s) Administered  . Pneumococcal Conjugate-13 08/01/2014  . Pneumococcal Polysaccharide-23 03/01/2013  . Td 02/10/1996   Objective:  BP 168/66  mmHg  Pulse 72  Temp(Src) 97.8 F (36.6 C)  Resp 14  Wt 88 lb (39.917 kg)  Physical Exam  Constitutional: She is oriented to person, place, and time and well-developed, well-nourished, and in no distress.  HENT:  Head: Normocephalic and atraumatic.  Right Ear: External ear normal.  Left Ear: External ear normal.  Nose: Nose normal.  Eyes: Conjunctivae are normal.  Neck: Neck supple.  Cardiovascular: Normal rate, regular rhythm and normal heart sounds.   Pulmonary/Chest: Effort normal and breath sounds normal.  Abdominal: Soft.  Neurological: She is alert and oriented to person, place, and time.  Skin: Skin is warm and dry.  Psychiatric: Mood, memory, affect and judgment normal.    Lab Results  Component Value Date   WBC 13.4* 05/30/2015   HGB 12.8 08/04/2014   HCT 39.3 05/30/2015   PLT 194 08/04/2014   GLUCOSE 98 05/30/2015   CHOL 165 05/30/2015   TRIG 115 05/30/2015   HDL 51 05/30/2015   LDLCALC 91 05/30/2015   TSH 1.240 05/30/2015    CMP     Component Value Date/Time   NA 144 05/30/2015 1042   NA 141 11/21/2014 1234   K 4.2 05/30/2015 1042   K 3.9 11/21/2014 1234   CL 108 05/30/2015 1042   CL 110* 11/21/2014 1234   CO2 16* 05/30/2015 1042   CO2 24 11/21/2014 1234   GLUCOSE 98 05/30/2015 1042   GLUCOSE 91 11/21/2014 1234   BUN 40* 05/30/2015 1042   BUN 29* 11/21/2014 1234   CREATININE 2.05* 05/30/2015 1042   CREATININE 1.48* 11/21/2014 1234   CREATININE 1.6* 08/04/2014   CALCIUM 9.0 05/30/2015 1042   CALCIUM 8.9 11/21/2014 1234   PROT 6.5 05/30/2015 1042   AST 8 05/30/2015 1042   ALT 6 05/30/2015 1042   ALKPHOS 55 05/30/2015 1042   BILITOT 0.2 05/30/2015 1042   GFRNONAA 23* 05/30/2015 1042   GFRNONAA 37* 11/21/2014 1234   GFRAA 27* 05/30/2015 1042   GFRAA 44* 11/21/2014 1234    Assessment and Plan :  1. Anxiety, generalized Stable.  2. Depression   3. Dementia, without behavioral disturbance Stable.  4. Smokes tobacco daily  5.  Malnutrition/cachexia of chronic disease/weight loss. Stable and improved I have done the exam and reviewed the above chart and it is accurate to the best of my knowledge.  Miguel Aschoff MD [facility] 07/30/2015 11:49 AM

## 2015-08-03 ENCOUNTER — Encounter: Payer: Self-pay | Admitting: Family Medicine

## 2015-08-06 ENCOUNTER — Ambulatory Visit: Payer: Medicare Other | Admitting: Family Medicine

## 2015-09-24 DIAGNOSIS — N183 Chronic kidney disease, stage 3 (moderate): Secondary | ICD-10-CM | POA: Diagnosis not present

## 2015-09-24 DIAGNOSIS — I10 Essential (primary) hypertension: Secondary | ICD-10-CM | POA: Diagnosis not present

## 2015-09-24 DIAGNOSIS — I701 Atherosclerosis of renal artery: Secondary | ICD-10-CM | POA: Diagnosis not present

## 2015-10-02 ENCOUNTER — Ambulatory Visit: Payer: Medicare Other | Admitting: Family Medicine

## 2015-10-09 ENCOUNTER — Ambulatory Visit (INDEPENDENT_AMBULATORY_CARE_PROVIDER_SITE_OTHER): Payer: Medicare Other | Admitting: Family Medicine

## 2015-10-09 VITALS — BP 160/52 | HR 80 | Temp 97.7°F | Resp 16 | Wt 90.0 lb

## 2015-10-09 DIAGNOSIS — F0390 Unspecified dementia without behavioral disturbance: Secondary | ICD-10-CM | POA: Diagnosis not present

## 2015-10-09 DIAGNOSIS — E785 Hyperlipidemia, unspecified: Secondary | ICD-10-CM

## 2015-10-09 DIAGNOSIS — R634 Abnormal weight loss: Secondary | ICD-10-CM

## 2015-10-09 DIAGNOSIS — I10 Essential (primary) hypertension: Secondary | ICD-10-CM

## 2015-10-09 DIAGNOSIS — F329 Major depressive disorder, single episode, unspecified: Secondary | ICD-10-CM | POA: Diagnosis not present

## 2015-10-09 DIAGNOSIS — F419 Anxiety disorder, unspecified: Secondary | ICD-10-CM

## 2015-10-09 DIAGNOSIS — F32A Depression, unspecified: Secondary | ICD-10-CM

## 2015-10-09 MED ORDER — AMLODIPINE BESYLATE 10 MG PO TABS: 10.0000 mg | ORAL_TABLET | Freq: Every day | ORAL | Status: DC

## 2015-10-09 MED ORDER — LISINOPRIL 40 MG PO TABS: 40.0000 mg | ORAL_TABLET | Freq: Every day | ORAL | Status: DC

## 2015-10-09 MED ORDER — LOVASTATIN 40 MG PO TABS: 40.0000 mg | ORAL_TABLET | Freq: Every day | ORAL | Status: DC

## 2015-10-09 MED ORDER — LORAZEPAM 1 MG PO TABS
ORAL_TABLET | ORAL | Status: DC
Start: 2015-10-09 — End: 2016-05-16

## 2015-10-09 NOTE — Progress Notes (Signed)
Patient ID: Sarah [name], female   DOB: [birth year], 74 y.o.   MRN: [id number]   Daisey Caloca  MRN: [id number] DOB: 1941/01/09  Subjective:  HPI   1. Depression Patient is a 74 year old female who presents for follow up of her depression.  The patient states she feels ok, she did not really answer my questions appropriately. She doesn't remember if she has taken anything for depression.  When she is asked questions about anything she will start drifting off onto another topic and will not actually answer the questions asked.  The patient did say that she had been through a lot with her husband and she thinks that she is doing some better now.    2. Weight loss, unintentional The patient is also here to have her weight checked.   The patient states she has been eating but doesn't know why she keeps losing weight.  She was last seen in October and weight was 88, today it is 90.   3. Dementia, without behavioral disturbance The patient is also here to follow up on dementia.  She states she has not been on the Aricept and she did not have it with her other medications.  She states that she was taken off of it on by the doctor.      Patient Active Problem List   Diagnosis Date Noted  . Depression 05/29/2015  . Dementia 05/29/2015  . Allergic rhinitis 03/05/2015  . Weak pulse 03/05/2015  . Smokes tobacco daily 03/05/2015  . Anxiety, generalized 03/05/2015  . Acid reflux 03/05/2015  . HLD (hyperlipidemia) 03/05/2015  . Affective disorder, major ([place]) 03/05/2015  . Bad memory 03/05/2015  . Arthritis, degenerative 03/05/2015  . OP (osteoporosis) 03/05/2015  . Hypertension, renovascular 03/05/2015  . Scoliosis 03/05/2015  . Avitaminosis D 03/05/2015  . Weight loss 03/05/2015  . Family history of colon cancer 11/07/2014    No past medical history on file.  Social History   Social History  . Marital Status: Widowed    Spouse Name: N/A  . Number of Children: N/A  .  Years of Education: N/A   Occupational History  . Not on file.   Social History Main Topics  . Smoking status: Current Every Day Smoker -- 0.50 packs/day    Types: Cigarettes  . Smokeless tobacco: Not on file  . Alcohol Use: No  . Drug Use: No  . Sexual Activity: Not on file   Other Topics Concern  . Not on file   Social History Narrative    Outpatient Prescriptions Prior to Visit  Medication Sig Dispense Refill  . amLODipine (NORVASC) 10 MG tablet Take by mouth.    Marland Kitchen CALCIUM CARBONATE-VITAMIN D PO Take by mouth.    . Cholecalciferol (VITAMIN D3) 1000 UNITS CAPS Take by mouth.    . cyanocobalamin 100 MCG tablet Take by mouth.    Marland Kitchen lisinopril (PRINIVIL,ZESTRIL) 40 MG tablet Take by mouth.    Marland Kitchen LORazepam (ATIVAN) 1 MG tablet Take 1/2 tablet twice daily 30 tablet 5  . lovastatin (MEVACOR) 40 MG tablet Take by mouth.    . mirtazapine (REMERON) 30 MG tablet Take 1 tablet (30 mg total) by mouth at bedtime. 30 tablet 5  . Vitamin E 100 UNITS TABS Take by mouth.    . donepezil (ARICEPT) 5 MG tablet Take 1 tablet (5 mg total) by mouth at bedtime. 30 tablet 12  . meloxicam (MOBIC) 7.5 MG tablet TAKE ONE TABLET BY MOUTH ONCE DAILY  AS NEEDED 90 tablet 3  . pantoprazole (PROTONIX) 40 MG tablet Take by mouth.     No facility-administered medications prior to visit.   Outpatient Encounter Prescriptions as of 10/09/2015  Medication Sig Note  . amLODipine (NORVASC) 10 MG tablet Take by mouth. 03/05/2015: Received from: [facility]  . CALCIUM CARBONATE-VITAMIN D PO Take by mouth. 03/05/2015: Received from: [facility]  . Cholecalciferol (VITAMIN D3) 1000 UNITS CAPS Take by mouth. 05/02/2015: Received from: [facility]  . cyanocobalamin 100 MCG tablet Take by mouth. 03/05/2015: Received from: [facility]  . lisinopril (PRINIVIL,ZESTRIL) 40 MG tablet Take by mouth. 03/05/2015: Received from: [facility]  .  LORazepam (ATIVAN) 1 MG tablet Take 1/2 tablet twice daily   . lovastatin (MEVACOR) 40 MG tablet Take by mouth. 03/05/2015: Received from: [facility]  . mirtazapine (REMERON) 30 MG tablet Take 1 tablet (30 mg total) by mouth at bedtime.   . Vitamin E 100 UNITS TABS Take by mouth. 03/05/2015: Received from: [facility]  . [DISCONTINUED] donepezil (ARICEPT) 5 MG tablet Take 1 tablet (5 mg total) by mouth at bedtime.   . [DISCONTINUED] meloxicam (MOBIC) 7.5 MG tablet TAKE ONE TABLET BY MOUTH ONCE DAILY AS NEEDED   . [DISCONTINUED] pantoprazole (PROTONIX) 40 MG tablet Take by mouth. 03/05/2015: replace Omeprazole Received from: [facility]   No facility-administered encounter medications on file as of 10/09/2015.   Allergies  Allergen Reactions  . Penicillins Hives    Review of Systems  Constitutional: Negative for fever, chills and malaise/fatigue.  Eyes: Negative.   Respiratory: Negative for cough, shortness of breath and wheezing.   Cardiovascular: Negative for chest pain, palpitations and orthopnea.  Gastrointestinal: Negative.   Neurological: Negative for weakness and headaches.  Endo/Heme/Allergies: Negative.   Psychiatric/Behavioral: Negative.    Objective:  BP 160/52 mmHg  Pulse 80  Temp(Src) 97.7 F (36.5 C) (Oral)  Resp 16  Wt 90 lb (40.824 kg)  Physical Exam  Constitutional: She is well-developed, well-nourished, and in no distress.  HENT:  Head: Normocephalic and atraumatic.  Right Ear: External ear normal.  Left Ear: External ear normal.  Nose: Nose normal.  Eyes: Conjunctivae are normal.  Neck: Neck supple.  Cardiovascular: Normal rate, regular rhythm and normal heart sounds.   Pulmonary/Chest: Effort normal and breath sounds normal.  Abdominal: Soft.  Neurological: She is alert.  Skin: Skin is warm and dry.  Psychiatric: Mood and affect normal.    Assessment and Plan :  1. Depression  - LORazepam (ATIVAN)  1 MG tablet; Take 1/2 tablet twice daily  Dispense: 30 tablet; Refill: 5  2. Weight loss, unintentional   3. Dementia, without behavioral disturbance MMSE worse at 17/30 today.  Pt advised not to drive any more. May need to contact DMV if she does not comply.  4. Acute anxiety  - LORazepam (ATIVAN) 1 MG tablet; Take 1/2 tablet twice daily  Dispense: 30 tablet; Refill: 5  5. Hyperlipemia  - lovastatin (MEVACOR) 40 MG tablet; Take 1 tablet (40 mg total) by mouth at bedtime.  Dispense: 90 tablet; Refill: 3  6. Essential hypertension  - lisinopril (PRINIVIL,ZESTRIL) 40 MG tablet; Take 1 tablet (40 mg total) by mouth daily.  Dispense: 90 tablet; Refill: 3 - amLODipine (NORVASC) 10 MG tablet; Take 1 tablet (10 mg total) by mouth daily.  Dispense: 90 tablet; Refill: 3 I have done the exam and reviewed the above chart and it is accurate to  the best of my knowledge.    Miguel Aschoff MD [facility] 10/09/2015 3:31 PM

## 2015-12-06 ENCOUNTER — Encounter: Payer: Self-pay | Admitting: Family Medicine

## 2016-01-22 DIAGNOSIS — I10 Essential (primary) hypertension: Secondary | ICD-10-CM | POA: Diagnosis not present

## 2016-01-22 DIAGNOSIS — I701 Atherosclerosis of renal artery: Secondary | ICD-10-CM | POA: Diagnosis not present

## 2016-01-22 DIAGNOSIS — N183 Chronic kidney disease, stage 3 (moderate): Secondary | ICD-10-CM | POA: Diagnosis not present

## 2016-01-24 DIAGNOSIS — I10 Essential (primary) hypertension: Secondary | ICD-10-CM | POA: Diagnosis not present

## 2016-01-24 DIAGNOSIS — I150 Renovascular hypertension: Secondary | ICD-10-CM | POA: Diagnosis not present

## 2016-01-24 DIAGNOSIS — I701 Atherosclerosis of renal artery: Secondary | ICD-10-CM | POA: Diagnosis not present

## 2016-01-28 ENCOUNTER — Ambulatory Visit: Payer: Medicare Other | Admitting: Family Medicine

## 2016-01-29 ENCOUNTER — Encounter: Payer: Self-pay | Admitting: Family Medicine

## 2016-01-29 ENCOUNTER — Ambulatory Visit (INDEPENDENT_AMBULATORY_CARE_PROVIDER_SITE_OTHER): Payer: Medicare Other | Admitting: Family Medicine

## 2016-01-29 VITALS — BP 148/56 | HR 64 | Temp 97.6°F | Resp 14 | Wt 90.0 lb

## 2016-01-29 DIAGNOSIS — F32A Depression, unspecified: Secondary | ICD-10-CM

## 2016-01-29 DIAGNOSIS — R634 Abnormal weight loss: Secondary | ICD-10-CM | POA: Diagnosis not present

## 2016-01-29 DIAGNOSIS — F0390 Unspecified dementia without behavioral disturbance: Secondary | ICD-10-CM | POA: Diagnosis not present

## 2016-01-29 DIAGNOSIS — Z720 Tobacco use: Secondary | ICD-10-CM

## 2016-01-29 DIAGNOSIS — T148 Other injury of unspecified body region: Secondary | ICD-10-CM

## 2016-01-29 DIAGNOSIS — F329 Major depressive disorder, single episode, unspecified: Secondary | ICD-10-CM

## 2016-01-29 DIAGNOSIS — F419 Anxiety disorder, unspecified: Secondary | ICD-10-CM

## 2016-01-29 DIAGNOSIS — F17200 Nicotine dependence, unspecified, uncomplicated: Secondary | ICD-10-CM

## 2016-01-29 DIAGNOSIS — T148XXA Other injury of unspecified body region, initial encounter: Secondary | ICD-10-CM

## 2016-01-29 NOTE — Progress Notes (Signed)
Patient ID: Sarah [name], female   DOB: 03-17-1941, 75 y.o.   MRN: [id number]    Subjective:  HPI  Patient is here for follow up.  Depression: Patient states she has some days she feels depressed and anxious but feels like she is ok.  Weight loss unintentionally: Patient has not lost any weight since last visit.  Had labs checked on her last visit in December and kidney function worsened per levels and she was suppose to discuss this with kidney doctor-she states they were suppose to send [place] office notes and i don't see anything in our records from them and patient does not remember his name.  Prior to Admission medications   Medication Sig Start Date End Date Taking? Authorizing Provider  amLODipine (NORVASC) 10 MG tablet Take 1 tablet (10 mg total) by mouth daily. 10/09/15  Yes Richard Maceo Pro., MD  CALCIUM CARBONATE-VITAMIN D PO Take by mouth.   Yes Historical Provider, MD  Cholecalciferol (VITAMIN D3) 1000 UNITS CAPS Take by mouth.   Yes Historical Provider, MD  cyanocobalamin 100 MCG tablet Take by mouth.   Yes Historical Provider, MD  lisinopril (PRINIVIL,ZESTRIL) 40 MG tablet Take 1 tablet (40 mg total) by mouth daily. 10/09/15  Yes Richard Maceo Pro., MD  LORazepam (ATIVAN) 1 MG tablet Take 1/2 tablet twice daily 10/09/15  Yes Richard Maceo Pro., MD  lovastatin (MEVACOR) 40 MG tablet Take 1 tablet (40 mg total) by mouth at bedtime. 10/09/15  Yes Richard Maceo Pro., MD  mirtazapine (REMERON) 30 MG tablet Take 1 tablet (30 mg total) by mouth at bedtime. 05/02/15  Yes Richard Maceo Pro., MD  Vitamin E 100 UNITS TABS Take by mouth.   Yes Historical Provider, MD    Patient Active Problem List   Diagnosis Date Noted  . Depression 05/29/2015  . Dementia 05/29/2015  . Allergic rhinitis 03/05/2015  . Weak pulse 03/05/2015  . Smokes tobacco daily 03/05/2015  . Anxiety, generalized 03/05/2015  . Acid reflux 03/05/2015  . HLD (hyperlipidemia) 03/05/2015  .  Affective disorder, major ([place]) 03/05/2015  . Bad memory 03/05/2015  . Arthritis, degenerative 03/05/2015  . OP (osteoporosis) 03/05/2015  . Hypertension, renovascular 03/05/2015  . Scoliosis 03/05/2015  . Avitaminosis D 03/05/2015  . Weight loss 03/05/2015  . Family history of colon cancer 11/07/2014    No past medical history on file.  Social History   Social History  . Marital Status: Widowed    Spouse Name: N/A  . Number of Children: N/A  . Years of Education: N/A   Occupational History  . Not on file.   Social History Main Topics  . Smoking status: Current Every Day Smoker -- 0.50 packs/day    Types: Cigarettes  . Smokeless tobacco: Never Used  . Alcohol Use: No  . Drug Use: No  . Sexual Activity: Not on file   Other Topics Concern  . Not on file   Social History Narrative    Allergies  Allergen Reactions  . Penicillins Hives    Review of Systems  Constitutional: Negative.   Respiratory: Negative.   Cardiovascular: Negative.   Musculoskeletal: Positive for joint pain (pain in the knees at times). Negative for myalgias, falls and neck pain.  Psychiatric/Behavioral: Positive for depression. The patient is nervous/anxious.     Immunization History  Administered Date(s) Administered  . Pneumococcal Conjugate-13 08/01/2014  . Pneumococcal Polysaccharide-23 03/01/2013  . Td 02/10/1996   Objective:  BP 148/56 mmHg  Pulse  64  Temp(Src) 97.6 F (36.4 C)  Resp 14  Wt 90 lb (40.824 kg)  Physical Exam  Constitutional: She is oriented to person, place, and time and well-developed, well-nourished, and in no distress.  Cachectic female in no acute distress.  HENT:  Head: Normocephalic.  Right Ear: External ear normal.  Left Ear: External ear normal.  Nose: Nose normal.  Eyes: Conjunctivae are normal.  Neck: Neck supple.  Cardiovascular: Normal rate, regular rhythm, normal heart sounds and intact distal pulses.   No murmur heard. Pulmonary/Chest:  Effort normal and breath sounds normal.  Mild expiratory rhonchi  Abdominal: Soft.  Musculoskeletal: She exhibits no edema or tenderness.  Neurological: She is alert and oriented to person, place, and time. Gait normal.  Skin: Skin is warm and dry.  Psychiatric: Mood and affect normal.    Lab Results  Component Value Date   WBC 13.4* 05/30/2015   HGB 12.8 08/04/2014   HCT 39.3 05/30/2015   PLT 221 05/30/2015   GLUCOSE 98 05/30/2015   CHOL 165 05/30/2015   TRIG 115 05/30/2015   HDL 51 05/30/2015   LDLCALC 91 05/30/2015   TSH 1.240 05/30/2015    CMP     Component Value Date/Time   NA 144 05/30/2015 1042   NA 141 11/21/2014 1234   K 4.2 05/30/2015 1042   K 3.9 11/21/2014 1234   CL 108 05/30/2015 1042   CL 110* 11/21/2014 1234   CO2 16* 05/30/2015 1042   CO2 24 11/21/2014 1234   GLUCOSE 98 05/30/2015 1042   GLUCOSE 91 11/21/2014 1234   BUN 40* 05/30/2015 1042   BUN 29* 11/21/2014 1234   CREATININE 2.05* 05/30/2015 1042   CREATININE 1.48* 11/21/2014 1234   CREATININE 1.6* 08/04/2014   CALCIUM 9.0 05/30/2015 1042   CALCIUM 8.9 11/21/2014 1234   PROT 6.5 05/30/2015 1042   ALBUMIN 3.9 05/30/2015 1042   AST 8 05/30/2015 1042   ALT 6 05/30/2015 1042   ALKPHOS 55 05/30/2015 1042   BILITOT 0.2 05/30/2015 1042   GFRNONAA 23* 05/30/2015 1042   GFRNONAA 37* 11/21/2014 1234   GFRAA 27* 05/30/2015 1042   GFRAA 44* 11/21/2014 1234    Assessment and Plan :  1. Depression Stable.  2. Weight loss, unintentional Weight is the same today from December 2016.  3. Dementia, without behavioral disturbance Patient not driving and I made it clear that she should not drive.hopefully family members will come in on future visits. 4. Acute anxiety/chronic Discussed cutting back on use of Lorazepam. More than 50% of this visit spent in discussing/counseling on these issues. 5. Smokes tobacco daily Pt advised to quit and she will try. Spirometry done today stable.  6.  Malnutrition Advised the patient actually eat whatever she wants. 7. Skin abrasion or bug bite possibly Mid back and lower left back area. Advised patient to keep an eye on the spots and we may need to try a cream for this.  I have done the exam and reviewed the above chart and it is accurate to the best of my knowledge.  Miguel Aschoff MD [facility] 01/29/2016 2:02 PM

## 2016-02-07 ENCOUNTER — Ambulatory Visit: Payer: Medicare Other | Admitting: Family Medicine

## 2016-03-08 ENCOUNTER — Other Ambulatory Visit: Payer: Self-pay | Admitting: Family Medicine

## 2016-04-30 ENCOUNTER — Ambulatory Visit (INDEPENDENT_AMBULATORY_CARE_PROVIDER_SITE_OTHER): Payer: Medicare Other | Admitting: Family Medicine

## 2016-04-30 ENCOUNTER — Encounter: Payer: Self-pay | Admitting: Family Medicine

## 2016-04-30 VITALS — BP 144/62 | HR 80 | Temp 98.1°F | Resp 16 | Wt 88.0 lb

## 2016-04-30 DIAGNOSIS — Z720 Tobacco use: Secondary | ICD-10-CM | POA: Diagnosis not present

## 2016-04-30 DIAGNOSIS — I150 Renovascular hypertension: Secondary | ICD-10-CM | POA: Diagnosis not present

## 2016-04-30 DIAGNOSIS — F17200 Nicotine dependence, unspecified, uncomplicated: Secondary | ICD-10-CM

## 2016-04-30 DIAGNOSIS — F0390 Unspecified dementia without behavioral disturbance: Secondary | ICD-10-CM | POA: Diagnosis not present

## 2016-04-30 NOTE — Progress Notes (Signed)
Patient: Enslie Sahota Female    DOB: 15-Sep-1941   75 y.o.   MRN: [id number] Visit Date: 04/30/2016  Today's Provider: Wilhemena Durie, MD   Chief Complaint  Patient presents with  . Hypertension  . Hyperlipidemia   Subjective:    HPI  Hypertension, follow-up:  BP Readings from Last 3 Encounters:  04/30/16 144/62  01/29/16 148/56  10/09/15 160/52    She was last seen for hypertension 3 months ago.  BP at that visit was 148/56. Management since that visit includes no changes. She reports good compliance with treatment. She is not having side effects.  She is not exercising. She is adherent to low salt diet.   Outside blood pressures are checked occasionally. Patient denies chest pain, fatigue, irregular heart beat, palpitations and tachypnea.   Cardiovascular risk factors include smoking/ tobacco exposure.     Weight trend: stable Wt Readings from Last 3 Encounters:  04/30/16 88 lb (39.917 kg)  01/29/16 90 lb (40.824 kg)  10/09/15 90 lb (40.824 kg)    Current diet: well balanced       Allergies  Allergen Reactions  . Penicillins Hives   Current Meds  Medication Sig  . alendronate (FOSAMAX) 10 MG tablet TAKE ONE TABLET BY MOUTH ONCE DAILY  . amLODipine (NORVASC) 10 MG tablet Take 1 tablet (10 mg total) by mouth daily.  Marland Kitchen CALCIUM CARBONATE-VITAMIN D PO Take by mouth.  . Cholecalciferol (VITAMIN D3) 1000 UNITS CAPS Take by mouth.  . cyanocobalamin 100 MCG tablet Take by mouth.  Marland Kitchen lisinopril (PRINIVIL,ZESTRIL) 40 MG tablet Take 1 tablet (40 mg total) by mouth daily.  Marland Kitchen LORazepam (ATIVAN) 1 MG tablet Take 1/2 tablet twice daily  . lovastatin (MEVACOR) 40 MG tablet Take 1 tablet (40 mg total) by mouth at bedtime.  . mirtazapine (REMERON) 30 MG tablet Take 1 tablet (30 mg total) by mouth at bedtime.  . Vitamin E 100 UNITS TABS Take by mouth.    Review of Systems  Constitutional: Negative.   Eyes: Negative.   Respiratory: Negative.     Cardiovascular: Negative.   Gastrointestinal: Negative.   Musculoskeletal: Negative.     Social History  Substance Use Topics  . Smoking status: Current Every Day Smoker -- 0.50 packs/day for 60 years    Types: Cigarettes  . Smokeless tobacco: Never Used  . Alcohol Use: No   Objective:   BP 144/62 mmHg  Pulse 80  Temp(Src) 98.1 F (36.7 C)  Resp 16  Wt 88 lb (39.917 kg)  Physical Exam  Constitutional: She appears well-developed and well-nourished.  HENT:  Head: Normocephalic and atraumatic.  Right Ear: External ear normal.  Left Ear: External ear normal.  Nose: Nose normal.  Eyes: Conjunctivae are normal.  Neck: Neck supple.  Cardiovascular: Normal rate, regular rhythm and normal heart sounds.   Pulmonary/Chest: Effort normal and breath sounds normal.  Abdominal: Soft.  Neurological: She is alert. No cranial nerve deficit. She exhibits normal muscle tone. Coordination normal.  Skin: Skin is warm and dry.  Psychiatric: She has a normal mood and affect. Her behavior is normal. Judgment and thought content normal.        Assessment & Plan:     Smokes tobacco daily - Plan: CT CHEST LUNG CA SCREEN LOW DOSE W/O CM, TSH  Dementia, without behavioral disturbance  Hypertension, renovascular - Plan: CBC with Differential/Platelet, Comprehensive metabolic panel Dementia is progressive., It is now moderate. [name] is with patient. I  have instructed him to take the keys so that she should no longer drive. Weight loss Probably secondary to COPD I have done the exam and reviewed the above chart and it is accurate to the best of my knowledge. More than 50% of this visit is spent in counseling.      Richard Cranford Mon, MD  [facility]

## 2016-05-01 ENCOUNTER — Telehealth: Payer: Self-pay | Admitting: Family Medicine

## 2016-05-01 ENCOUNTER — Telehealth: Payer: Self-pay | Admitting: *Deleted

## 2016-05-01 LAB — CBC WITH DIFFERENTIAL/PLATELET
BASOS ABS: 0.1 10*3/uL (ref 0.0–0.2)
Basos: 1 %
EOS (ABSOLUTE): 0.1 10*3/uL (ref 0.0–0.4)
EOS: 1 %
HEMOGLOBIN: 12.4 g/dL (ref 11.1–15.9)
Hematocrit: 37.5 % (ref 34.0–46.6)
IMMATURE GRANS (ABS): 0 10*3/uL (ref 0.0–0.1)
IMMATURE GRANULOCYTES: 0 %
LYMPHS: 23 %
Lymphocytes Absolute: 2.1 10*3/uL (ref 0.7–3.1)
MCH: 30.9 pg (ref 26.6–33.0)
MCHC: 33.1 g/dL (ref 31.5–35.7)
MCV: 94 fL (ref 79–97)
MONOCYTES: 7 %
Monocytes Absolute: 0.6 10*3/uL (ref 0.1–0.9)
NEUTROS PCT: 68 %
Neutrophils Absolute: 6.3 10*3/uL (ref 1.4–7.0)
Platelets: 226 10*3/uL (ref 150–379)
RBC: 4.01 x10E6/uL (ref 3.77–5.28)
RDW: 13.6 % (ref 12.3–15.4)
WBC: 9.3 10*3/uL (ref 3.4–10.8)

## 2016-05-01 LAB — COMPREHENSIVE METABOLIC PANEL
A/G RATIO: 1.7 (ref 1.2–2.2)
ALBUMIN: 4.2 g/dL (ref 3.5–4.8)
ALK PHOS: 43 IU/L (ref 39–117)
ALT: 7 IU/L (ref 0–32)
AST: 13 IU/L (ref 0–40)
BUN/Creatinine Ratio: 20 (ref 12–28)
BUN: 37 mg/dL — AB (ref 8–27)
CALCIUM: 9.4 mg/dL (ref 8.7–10.3)
CO2: 17 mmol/L — ABNORMAL LOW (ref 18–29)
CREATININE: 1.82 mg/dL — AB (ref 0.57–1.00)
Chloride: 102 mmol/L (ref 96–106)
GFR calc Af Amer: 31 mL/min/{1.73_m2} — ABNORMAL LOW (ref >59)
GFR calc non Af Amer: 27 mL/min/{1.73_m2} — ABNORMAL LOW (ref >59)
GLOBULIN, TOTAL: 2.5 g/dL (ref 1.5–4.5)
Glucose: 114 mg/dL — ABNORMAL HIGH (ref 65–99)
Potassium: 4.7 mmol/L (ref 3.5–5.2)
Sodium: 140 mmol/L (ref 134–144)
TOTAL PROTEIN: 6.7 g/dL (ref 6.0–8.5)

## 2016-05-01 LAB — TSH: TSH: 0.531 u[IU]/mL (ref 0.450–4.500)

## 2016-05-01 NOTE — Telephone Encounter (Signed)
Received referral for initial lung cancer screening scan. Contacted patient and obtained smoking history, and attempted to schedule for shared decision making visit with Ct scan immediately afterward. However, patient request me call in a few weeks as she would like to see when grandson is in town to assist with transportation.

## 2016-05-15 ENCOUNTER — Other Ambulatory Visit: Payer: Self-pay | Admitting: Family Medicine

## 2016-05-15 DIAGNOSIS — R634 Abnormal weight loss: Secondary | ICD-10-CM

## 2016-05-15 DIAGNOSIS — I10 Essential (primary) hypertension: Secondary | ICD-10-CM

## 2016-05-15 DIAGNOSIS — F329 Major depressive disorder, single episode, unspecified: Secondary | ICD-10-CM

## 2016-05-15 DIAGNOSIS — F32A Depression, unspecified: Secondary | ICD-10-CM

## 2016-05-15 DIAGNOSIS — E785 Hyperlipidemia, unspecified: Secondary | ICD-10-CM

## 2016-05-15 DIAGNOSIS — F419 Anxiety disorder, unspecified: Secondary | ICD-10-CM

## 2016-05-15 NOTE — Telephone Encounter (Signed)
Please review. Thanks!  

## 2016-05-15 NOTE — Telephone Encounter (Signed)
Pt stated that she needs refills for all the medications that Dr. Rosanna Randy writes for her sent to [place] for 90 day supplies b/c she is living to go out of the country to visit family and will be gone for 3 months.   alendronate (FOSAMAX) 10 MG tablet amLODipine (NORVASC) 10 MG tablet lisinopril (PRINIVIL,ZESTRIL) 40 MG tablet LORazepam (ATIVAN) 1 MG tablet lovastatin (MEVACOR) 40 MG tablet mirtazapine (REMERON) 30 MG tablet  Please advise. Thank TNP

## 2016-05-16 MED ORDER — AMLODIPINE BESYLATE 10 MG PO TABS: 10.0000 mg | ORAL_TABLET | Freq: Every day | ORAL | Status: DC

## 2016-05-16 MED ORDER — LORAZEPAM 1 MG PO TABS: ORAL_TABLET | ORAL | Status: DC

## 2016-05-16 MED ORDER — ALENDRONATE SODIUM 10 MG PO TABS: 10.0000 mg | ORAL_TABLET | Freq: Every day | ORAL | Status: DC

## 2016-05-16 MED ORDER — LISINOPRIL 40 MG PO TABS: 40.0000 mg | ORAL_TABLET | Freq: Every day | ORAL | Status: DC

## 2016-05-16 MED ORDER — LOVASTATIN 40 MG PO TABS: 40.0000 mg | ORAL_TABLET | Freq: Every day | ORAL | Status: DC

## 2016-05-16 MED ORDER — MIRTAZAPINE 30 MG PO TABS: 30.0000 mg | ORAL_TABLET | Freq: Every day | ORAL | Status: DC

## 2016-05-16 NOTE — Telephone Encounter (Signed)
Done-aa 

## 2016-05-16 NOTE — Telephone Encounter (Signed)
Ok--1 rf on these.

## 2016-05-23 DIAGNOSIS — I10 Essential (primary) hypertension: Secondary | ICD-10-CM | POA: Diagnosis not present

## 2016-05-23 DIAGNOSIS — N183 Chronic kidney disease, stage 3 (moderate): Secondary | ICD-10-CM | POA: Diagnosis not present

## 2016-05-23 DIAGNOSIS — I701 Atherosclerosis of renal artery: Secondary | ICD-10-CM | POA: Diagnosis not present

## 2016-06-10 ENCOUNTER — Telehealth: Payer: Self-pay | Admitting: *Deleted

## 2016-06-10 NOTE — Telephone Encounter (Signed)
Received referral for initial lung cancer screening scan. Contacted patient and obtained smoking history,(current, 30 pack year) as well as answering questions related to screening process. Patient denies signs of lung cancer such as weight loss or hemoptysis. Patient denies comorbidity that would prevent curative treatment if lung cancer were found. Patient is tentatively scheduled for shared decision making visit and CT scan on 06/17/16 at 1:30pm, pending insurance approval from business office.

## 2016-06-11 NOTE — Telephone Encounter (Signed)
error 

## 2016-06-13 ENCOUNTER — Encounter: Payer: Self-pay | Admitting: *Deleted

## 2016-06-13 ENCOUNTER — Other Ambulatory Visit: Payer: Self-pay | Admitting: *Deleted

## 2016-06-13 DIAGNOSIS — Z87891 Personal history of nicotine dependence: Secondary | ICD-10-CM

## 2016-06-17 ENCOUNTER — Inpatient Hospital Stay: Payer: Medicare Other | Attending: Oncology | Admitting: Oncology

## 2016-06-17 ENCOUNTER — Ambulatory Visit
Admission: RE | Admit: 2016-06-17 | Discharge: 2016-06-17 | Disposition: A | Discharge status: Home / Self Care | Payer: Medicare Other | Source: Ambulatory Visit | Attending: Oncology | Admitting: Oncology

## 2016-06-17 ENCOUNTER — Encounter (INDEPENDENT_AMBULATORY_CARE_PROVIDER_SITE_OTHER): Payer: Self-pay

## 2016-06-17 DIAGNOSIS — Z122 Encounter for screening for malignant neoplasm of respiratory organs: Secondary | ICD-10-CM

## 2016-06-17 DIAGNOSIS — Z87891 Personal history of nicotine dependence: Secondary | ICD-10-CM

## 2016-06-17 DIAGNOSIS — I2510 Atherosclerotic heart disease of native coronary artery without angina pectoris: Secondary | ICD-10-CM | POA: Diagnosis not present

## 2016-06-17 DIAGNOSIS — I700 Atherosclerosis of aorta: Secondary | ICD-10-CM | POA: Insufficient documentation

## 2016-06-17 DIAGNOSIS — J432 Centrilobular emphysema: Secondary | ICD-10-CM | POA: Insufficient documentation

## 2016-06-23 ENCOUNTER — Telehealth: Payer: Self-pay | Admitting: *Deleted

## 2016-06-23 DIAGNOSIS — Z87891 Personal history of nicotine dependence: Secondary | ICD-10-CM | POA: Insufficient documentation

## 2016-06-23 NOTE — Progress Notes (Signed)
In accordance with CMS guidelines, patient has met eligibility criteria including age, absence of signs or symptoms of lung cancer.  Social History  Substance Use Topics  . Smoking status: Current Every Day Smoker    Packs/day: 0.75    Years: 40.00    Types: Cigarettes  . Smokeless tobacco: Never Used  . Alcohol use No     A shared decision-making session was conducted prior to the performance of CT scan. This includes one or more decision aids, includes benefits and harms of screening, follow-up diagnostic testing, over-diagnosis, false positive rate, and total radiation exposure.  Counseling on the importance of adherence to annual lung cancer LDCT screening, impact of co-morbidities, and ability or willingness to undergo diagnosis and treatment is imperative for compliance of the program.  Counseling on the importance of continued smoking cessation for former smokers; the importance of smoking cessation for current smokers, and information about tobacco cessation interventions have been given to patient including [place] and [employer] quit [place] programs.  Written order for lung cancer screening with LDCT has been given to the patient and any and all questions have been answered to the best of my abilities.   Yearly follow up will be coordinated by Burgess Estelle, Thoracic Navigator.

## 2016-06-23 NOTE — Telephone Encounter (Signed)
Attempted to notify patient of LDCT lung cancer screening results with recommendation for 3 month follow up imaging. Also will be notified of incidental finding noted below. This recommendation comes from consensus of Thoracic Oncology Conference review and discussion. Patient's PCP has been notified of this plan and this document will be forwarded to him as well.   IMPRESSION: 1. Lung-RADS Category 4A S, suspicious. Two similar-appearing subpleural nodules in the left lower lobe, largest 8.9 mm in volume derived mean diameter. Follow up low-dose chest CT without contrast in 3 months (please use the following order, "CT CHEST LCS NODULE FOLLOW-UP W/O CM") is recommended. Alternatively, PET may be considered when there is a solid component 22m or larger. 2. The "S" modifier above refers to potentially clinically significant non lung cancer related findings. Specifically, left main and 3 vessel coronary atherosclerosis. 3. Additional findings include aortic atherosclerosis and mild-to-moderate centrilobular emphysema with mild diffuse bronchial wall thickening, suggesting COPD.

## 2016-06-23 NOTE — Telephone Encounter (Signed)
Patient and grandson notified of contents of last note. They are in agreement with 3 month followup imaging. They are going out of the country but will return 09/10/16. They will contact me at that time for scheduling of scan.

## 2016-07-02 ENCOUNTER — Other Ambulatory Visit: Payer: Self-pay | Admitting: Family Medicine

## 2016-07-02 DIAGNOSIS — F329 Major depressive disorder, single episode, unspecified: Secondary | ICD-10-CM

## 2016-07-02 DIAGNOSIS — F32A Depression, unspecified: Secondary | ICD-10-CM

## 2016-07-02 DIAGNOSIS — R634 Abnormal weight loss: Secondary | ICD-10-CM

## 2016-07-02 DIAGNOSIS — I10 Essential (primary) hypertension: Secondary | ICD-10-CM

## 2016-07-02 DIAGNOSIS — E785 Hyperlipidemia, unspecified: Secondary | ICD-10-CM

## 2016-07-02 DIAGNOSIS — F419 Anxiety disorder, unspecified: Secondary | ICD-10-CM

## 2016-07-02 NOTE — Telephone Encounter (Signed)
Please review-aa 

## 2016-07-02 NOTE — Telephone Encounter (Signed)
Pt is states she is going to go to [place] 3 months.  Pt is requesting a 90 day supply on her refills so she can take her medication with her.  [place]  BT#[phone]/[id number]  LORazepam (ATIVAN) 1 MG tablet  amLODipine (NORVASC) 10 MG tabl  lovastatin (MEVACOR) 40 MG tablet  mirtazapine (REMERON) 30 MG tablet  alendronate (FOSAMAX) 10 MG tablet  lisinopril (PRINIVIL,ZESTRIL) 40 MG tablet

## 2016-07-03 MED ORDER — LORAZEPAM 1 MG PO TABS: ORAL_TABLET | ORAL | 1 refills | Status: DC

## 2016-07-03 MED ORDER — MIRTAZAPINE 30 MG PO TABS: 30.0000 mg | ORAL_TABLET | Freq: Every day | ORAL | 1 refills | Status: DC

## 2016-07-03 MED ORDER — ALENDRONATE SODIUM 10 MG PO TABS: 10.0000 mg | ORAL_TABLET | Freq: Every day | ORAL | 1 refills | Status: DC

## 2016-07-03 MED ORDER — AMLODIPINE BESYLATE 10 MG PO TABS: 10.0000 mg | ORAL_TABLET | Freq: Every day | ORAL | 1 refills | Status: DC

## 2016-07-03 MED ORDER — LISINOPRIL 40 MG PO TABS: 40.0000 mg | ORAL_TABLET | Freq: Every day | ORAL | 1 refills | Status: DC

## 2016-07-03 MED ORDER — LOVASTATIN 40 MG PO TABS: 40.0000 mg | ORAL_TABLET | Freq: Every day | ORAL | 1 refills | Status: DC

## 2016-07-03 NOTE — Telephone Encounter (Signed)
Okay to do?

## 2016-07-04 ENCOUNTER — Other Ambulatory Visit: Payer: Self-pay

## 2016-07-04 MED ORDER — ALENDRONATE SODIUM 10 MG PO TABS: 10.0000 mg | ORAL_TABLET | ORAL | 1 refills | Status: DC

## 2016-07-08 NOTE — Telephone Encounter (Signed)
This was already done-aa

## 2016-08-20 ENCOUNTER — Telehealth: Payer: Self-pay | Admitting: Family Medicine

## 2016-08-20 NOTE — Telephone Encounter (Signed)
LM for pt to sched AWV w/[facility] in Nov; j.[email or web address]

## 2016-09-02 ENCOUNTER — Telehealth: Payer: Self-pay | Admitting: *Deleted

## 2016-09-02 NOTE — Telephone Encounter (Signed)
Left voicemail for patient notifyng them that it is time to schedule lung cancer screening follow up CT scan. Instructed patient to call back to verify information prior to the scan being scheduled. Noted patient had reported that she would be out of the country until 09/10/16.

## 2016-09-22 ENCOUNTER — Ambulatory Visit: Payer: Medicare Other | Admitting: Family Medicine

## 2016-09-23 ENCOUNTER — Telehealth: Payer: Self-pay | Admitting: *Deleted

## 2016-09-23 NOTE — Telephone Encounter (Signed)
Left voicemail for patient notifyng them that it is time to schedule follow up imaging lung cancer screening CT scan. Instructed patient to call back to verify information prior to the scan being scheduled.

## 2016-09-24 ENCOUNTER — Telehealth: Payer: Self-pay | Admitting: *Deleted

## 2016-09-24 NOTE — Telephone Encounter (Signed)
Notified patient that lung cancer screening CT scan follow up imaging is due. Confirmed that patient is within the age range of 55-77, and asymptomatic, (no signs or symptoms of lung cancer). Patient denies illness that would prevent curative treatment for lung cancer if found. The patient is a current smoker, with a 30 pack year history. The shared decision making visit was done 06/17/16. Patient is agreeable for CT scan being scheduled. Information sent to business office.

## 2016-09-25 ENCOUNTER — Ambulatory Visit (INDEPENDENT_AMBULATORY_CARE_PROVIDER_SITE_OTHER): Payer: Medicare Other | Admitting: Family Medicine

## 2016-09-25 VITALS — BP 130/48 | HR 72 | Temp 97.6°F | Resp 16 | Wt 95.0 lb

## 2016-09-25 DIAGNOSIS — M810 Age-related osteoporosis without current pathological fracture: Secondary | ICD-10-CM

## 2016-09-25 DIAGNOSIS — I10 Essential (primary) hypertension: Secondary | ICD-10-CM | POA: Diagnosis not present

## 2016-09-25 DIAGNOSIS — M1990 Unspecified osteoarthritis, unspecified site: Secondary | ICD-10-CM | POA: Diagnosis not present

## 2016-09-25 DIAGNOSIS — M25562 Pain in left knee: Secondary | ICD-10-CM | POA: Diagnosis not present

## 2016-09-25 DIAGNOSIS — R413 Other amnesia: Secondary | ICD-10-CM

## 2016-09-25 MED ORDER — DONEPEZIL HCL 5 MG PO TABS: 5.0000 mg | ORAL_TABLET | Freq: Every day | ORAL | 3 refills | Status: DC

## 2016-09-25 MED ORDER — ALENDRONATE SODIUM 70 MG PO TABS: 70.0000 mg | ORAL_TABLET | ORAL | 11 refills | Status: DC

## 2016-09-25 NOTE — Progress Notes (Signed)
Sarah [name]  MRN: [id number] DOB: 13-Oct-1941  Subjective:  HPI   Memory-Patient's grandson found her bottle of Donepezil and realized that the patient had not actually been taking the medication since it was prescribed.   Arthritis-Patient was on Meloxicam and wanted to know if she can take this for arthritic pain.  Weight loss-Patient had been being seen for weight loss.  She has gained 7 pounds since last visit. She and her grandson had spent 2 months in [place] with family and they were doing a lot of cooking.  Osteoporosis-Patient has been on Fosamax.  She had been taking 1 pill daily and on the last refill the pharmacy told her it should be once weekly.    Left knee-patient has been having trouble with her knee; pain and "going out".  Would like to discuss the need for evaluation.   Patient Active Problem List   Diagnosis Date Noted  . Personal history of tobacco use, presenting hazards to health 06/23/2016  . Depression 05/29/2015  . Dementia 05/29/2015  . Allergic rhinitis 03/05/2015  . Weak pulse 03/05/2015  . Smokes tobacco daily 03/05/2015  . Anxiety, generalized 03/05/2015  . Acid reflux 03/05/2015  . HLD (hyperlipidemia) 03/05/2015  . Affective disorder, major 03/05/2015  . Bad memory 03/05/2015  . Arthritis, degenerative 03/05/2015  . OP (osteoporosis) 03/05/2015  . Hypertension, renovascular 03/05/2015  . Scoliosis 03/05/2015  . Avitaminosis D 03/05/2015  . Weight loss 03/05/2015  . Family history of colon cancer 11/07/2014    No past medical history on file.  Social History   Social History  . Marital status: Widowed    Spouse name: N/A  . Number of children: N/A  . Years of education: N/A   Occupational History  . Not on file.   Social History Main Topics  . Smoking status: Current Every Day Smoker    Packs/day: 0.75    Years: 40.00    Types: Cigarettes  . Smokeless tobacco: Never Used  . Alcohol use No  . Drug use: No  .  Sexual activity: Not on file   Other Topics Concern  . Not on file   Social History Narrative  . No narrative on file    Outpatient Encounter Prescriptions as of 09/25/2016  Medication Sig Note  . alendronate (FOSAMAX) 10 MG tablet Take 1 tablet (10 mg total) by mouth once a week. Take with a full glass of water on an empty stomach.   Marland Kitchen amLODipine (NORVASC) 10 MG tablet Take 1 tablet (10 mg total) by mouth daily.   Marland Kitchen CALCIUM CARBONATE-VITAMIN D PO Take by mouth. 03/05/2015: Received from: [facility]  . Cholecalciferol (VITAMIN D3) 1000 UNITS CAPS Take by mouth. 05/02/2015: Received from: [facility]  . cyanocobalamin 100 MCG tablet Take by mouth. 03/05/2015: Received from: [facility]  . lisinopril (PRINIVIL,ZESTRIL) 40 MG tablet Take 1 tablet (40 mg total) by mouth daily.   Marland Kitchen LORazepam (ATIVAN) 1 MG tablet Take 1/2 tablet twice daily   . lovastatin (MEVACOR) 40 MG tablet Take 1 tablet (40 mg total) by mouth at bedtime.   . mirtazapine (REMERON) 30 MG tablet Take 1 tablet (30 mg total) by mouth at bedtime.   . Vitamin E 100 UNITS TABS Take by mouth. 03/05/2015: Received from: [facility]   No facility-administered encounter medications on file as of 09/25/2016.     Allergies  Allergen Reactions  . Penicillins Hives    Review of Systems  Constitutional: Negative for fever, malaise/fatigue and weight loss (Patient has giained 7 pounds since last viswit).  Respiratory: Negative for cough, shortness of breath and wheezing.   Cardiovascular: Negative for chest pain, palpitations, orthopnea, claudication, leg swelling and PND.  Neurological: Negative.  Negative for weakness.  Endo/Heme/Allergies: Negative.   Psychiatric/Behavioral: Negative.     Objective:  BP (!) 130/48 (BP Location: Right Arm, Patient Position: Sitting, Cuff Size: Normal)   Pulse 72   Temp 97.6 F (36.4 C) (Oral)   Resp 16   Wt 95 lb (43.1 kg)    BMI 18.55 kg/m   Physical Exam  Constitutional: She is oriented to person, place, and time and well-developed, well-nourished, and in no distress.  HENT:  Head: Normocephalic and atraumatic.  Right Ear: External ear normal.  Left Ear: External ear normal.  Nose: Nose normal.  Mouth/Throat: Oropharynx is clear and moist.  Eyes: Conjunctivae are normal. No scleral icterus.  Neck: No thyromegaly present.  Cardiovascular: Normal rate, regular rhythm and normal heart sounds.   Pulmonary/Chest: Effort normal and breath sounds normal.  Abdominal: Soft.  Neurological: She is alert and oriented to person, place, and time.  Skin: Skin is warm and dry.  Psychiatric: Mood, memory, affect and judgment normal.    Assessment and Plan :  Alzheimers Disease--presently mild but progressive Restarted Donepezil  at 5 mg daily. 10 mg upset her stomach Grand son just graduated from college and is now living in caring for patient. He is a good young man. Tobacco abuse Patient still encouraged to stop. Hypertension Hypertensive nephropathy Osteoarthritis Spent a good deal of time having the discussed with grandson the issues as patient bases. NSAID helps her arthritis pain but it is not safe with her nephropathy. We'll use Tylenol for now. Try to avoid narcotics. Refer to orthopedics regarding her arthritis at this time. The knee pain is starting to cause her to be less active Osteoporosis Try Fosamax 70 mg weekly now the patient's grandson is here to give her medication safely. Unintentional weight loss Patient actually gained 7 pounds on a recent extended trip with family to [place]. I think her weight loss was due to her dementia living alone, and not eating. We'll monitor this. More than 50% of this visit is spent discussing the issues with patient and her grandson Mild COPD Abnormal screening chest CT due to smoking Follow-up CT scheduled 10/07/2016. Patient may in fact have lung cancer. May need  PET scan and/or oncology referral after low-dose CT scan  I have done the exam and reviewed the chart and it is accurate to the best of my knowledge. Development worker, community has been used and  any errors in dictation or transcription are unintentional. Miguel Aschoff M.D. [facility]

## 2016-09-25 NOTE — Patient Instructions (Signed)
Try Tylenol for pain.

## 2016-10-01 ENCOUNTER — Other Ambulatory Visit: Payer: Self-pay | Admitting: *Deleted

## 2016-10-01 DIAGNOSIS — R9389 Abnormal findings on diagnostic imaging of other specified body structures: Secondary | ICD-10-CM

## 2016-10-07 ENCOUNTER — Ambulatory Visit
Admission: RE | Admit: 2016-10-07 | Discharge: 2016-10-07 | Disposition: A | Discharge status: Home / Self Care | Payer: Medicare Other | Source: Ambulatory Visit | Attending: Oncology | Admitting: Oncology

## 2016-10-07 DIAGNOSIS — R938 Abnormal findings on diagnostic imaging of other specified body structures: Secondary | ICD-10-CM | POA: Diagnosis present

## 2016-10-07 DIAGNOSIS — R918 Other nonspecific abnormal finding of lung field: Secondary | ICD-10-CM | POA: Insufficient documentation

## 2016-10-07 DIAGNOSIS — R9389 Abnormal findings on diagnostic imaging of other specified body structures: Secondary | ICD-10-CM

## 2016-10-09 ENCOUNTER — Telehealth: Payer: Self-pay | Admitting: *Deleted

## 2016-10-09 NOTE — Telephone Encounter (Signed)
Left voicemail in attempt to notify patient of LDCT lung cancer screening results with recommendation for 6 month follow up imaging. Also upon return call, will be notified of incidental finding noted below. This note will be forwarded to patient's PCP via Epic.   IMPRESSION: 7.0 mm branching nodular opacity in the left lower lobe, new, likely infectious/inflammatory given the appearance/configuration. Overall, this is probably best characterized as a Lung Rads 3 lesion.  Two subpleural nodules in the left lower lobe measuring up to 8.8 mm, unchanged. Additional scattered bilateral pulmonary nodules measuring up to 5.4 mm.  Lung-Rads category 3, probably benign findings. Short-term follow-up in 6 months is recommended with repeat low-dose chest CT without contrast (please use the following order, "CT CHEST LCS NODULE FOLLOW-UP W/O CM"). Prenatal such that it within the 4

## 2016-10-13 ENCOUNTER — Ambulatory Visit: Payer: Medicare Other

## 2016-11-07 DIAGNOSIS — N184 Chronic kidney disease, stage 4 (severe): Secondary | ICD-10-CM | POA: Diagnosis not present

## 2016-11-07 DIAGNOSIS — I129 Hypertensive chronic kidney disease with stage 1 through stage 4 chronic kidney disease, or unspecified chronic kidney disease: Secondary | ICD-10-CM | POA: Diagnosis not present

## 2016-11-07 DIAGNOSIS — I10 Essential (primary) hypertension: Secondary | ICD-10-CM | POA: Diagnosis not present

## 2016-11-25 ENCOUNTER — Encounter: Payer: Self-pay | Admitting: Family Medicine

## 2016-11-25 ENCOUNTER — Ambulatory Visit (INDEPENDENT_AMBULATORY_CARE_PROVIDER_SITE_OTHER): Payer: Medicare Other | Admitting: Family Medicine

## 2016-11-25 VITALS — BP 116/56 | HR 64 | Temp 97.6°F | Resp 16 | Wt 95.0 lb

## 2016-11-25 DIAGNOSIS — K219 Gastro-esophageal reflux disease without esophagitis: Secondary | ICD-10-CM

## 2016-11-25 DIAGNOSIS — R197 Diarrhea, unspecified: Secondary | ICD-10-CM

## 2016-11-25 DIAGNOSIS — F0390 Unspecified dementia without behavioral disturbance: Secondary | ICD-10-CM | POA: Diagnosis not present

## 2016-11-25 DIAGNOSIS — F419 Anxiety disorder, unspecified: Secondary | ICD-10-CM

## 2016-11-25 MED ORDER — PANTOPRAZOLE SODIUM 40 MG PO TBEC: 40.0000 mg | DELAYED_RELEASE_TABLET | Freq: Every day | ORAL | 3 refills | Status: DC

## 2016-11-25 MED ORDER — PSYLLIUM 28.3 % PO POWD: ORAL | Status: DC

## 2016-11-25 MED ORDER — LORAZEPAM 1 MG PO TABS: ORAL_TABLET | ORAL | 5 refills | Status: DC

## 2016-11-25 NOTE — Patient Instructions (Signed)
Use OTC Metamucil. Mix with 8 oz of water as directed daily.

## 2016-11-25 NOTE — Progress Notes (Signed)
Patient: Sarah [name] Female    DOB: [birth year]   76 y.o.   MRN: [id number] Visit Date: 11/25/2016  Today's Provider: Wilhemena Durie, MD   Chief Complaint  Patient presents with  . Diarrhea   Subjective:    Diarrhea   The current episode started more than 1 month ago. Episode frequency: grandson reports pt is going 10-12 times per day. The problem has been gradually worsening. Associated symptoms include increased flatus. Pertinent negatives include no abdominal pain, bloating, fever, vomiting or weight loss. She has tried bismuth subsalicylate for the symptoms. The treatment provided mild relief.  Pt denies hematochezia or melena.  Pt needs refills on lorazepam and pantoprazole.    Allergies  Allergen Reactions  . Penicillins Hives     Current Outpatient Prescriptions:  .  alendronate (FOSAMAX) 70 MG tablet, Take 1 tablet (70 mg total) by mouth once a week. Take every Friday. Take with a full glass of water on an empty stomach., Disp: 4 tablet, Rfl: 11 .  amLODipine (NORVASC) 10 MG tablet, Take 1 tablet (10 mg total) by mouth daily., Disp: 90 tablet, Rfl: 1 .  CALCIUM CARBONATE-VITAMIN D PO, Take by mouth., Disp: , Rfl:  .  Cholecalciferol (VITAMIN D3) 1000 UNITS CAPS, Take by mouth., Disp: , Rfl:  .  cyanocobalamin 100 MCG tablet, Take by mouth., Disp: , Rfl:  .  donepezil (ARICEPT) 5 MG tablet, Take 1 tablet (5 mg total) by mouth at bedtime., Disp: 90 tablet, Rfl: 3 .  lisinopril (PRINIVIL,ZESTRIL) 40 MG tablet, Take 1 tablet (40 mg total) by mouth daily., Disp: 90 tablet, Rfl: 1 .  LORazepam (ATIVAN) 1 MG tablet, Take 1/2 tablet twice daily, Disp: 90 tablet, Rfl: 1 .  lovastatin (MEVACOR) 40 MG tablet, Take 1 tablet (40 mg total) by mouth at bedtime., Disp: 90 tablet, Rfl: 1 .  mirtazapine (REMERON) 30 MG tablet, Take 1 tablet (30 mg total) by mouth at bedtime., Disp: 90 tablet, Rfl: 1 .  Vitamin E 100 UNITS TABS, Take by mouth., Disp: , Rfl:   Review of  Systems  Constitutional: Negative for fever, unexpected weight change and weight loss.  HENT: Negative.   Eyes: Positive for photophobia and visual disturbance.  Respiratory: Negative.   Cardiovascular: Negative.   Gastrointestinal: Positive for diarrhea and flatus. Negative for abdominal distention, abdominal pain, bloating, blood in stool, nausea and vomiting.  Endocrine: Negative.   Allergic/Immunologic: Negative.   Hematological: Negative.     Social History  Substance Use Topics  . Smoking status: Current Every Day Smoker    Packs/day: 0.75    Years: 40.00    Types: Cigarettes  . Smokeless tobacco: Never Used  . Alcohol use No   Objective:   BP (!) 116/56 (BP Location: Left Arm, Patient Position: Sitting, Cuff Size: Normal)   Pulse 64   Temp 97.6 F (36.4 C) (Oral)   Resp 16   Wt 95 lb (43.1 kg)   BMI 18.55 kg/m   Physical Exam  Constitutional: She is oriented to person, place, and time. She appears well-developed and well-nourished.  HENT:  Head: Normocephalic and atraumatic.  Right Ear: External ear normal.  Left Ear: External ear normal.  Nose: Nose normal.  Eyes: Conjunctivae are normal.  Neck: Normal range of motion. No thyromegaly present.  Cardiovascular: Normal rate, regular rhythm and normal heart sounds.   Pulmonary/Chest: Effort normal and breath sounds normal. No respiratory distress.  Abdominal: Soft. She exhibits no  distension. There is no tenderness.  Neurological: She is alert and oriented to person, place, and time.  Skin: Skin is warm and dry.  Psychiatric: She has a normal mood and affect. Her behavior is normal.        Assessment & Plan:     1. Diarrhea, unspecified type New problem. Will check labs and start Metamucil qd. Refer to GI for further evaluation. - Ambulatory referral to Gastroenterology - CBC with Differential/Platelet - Comprehensive metabolic panel - TSH - Psyllium (METAMUCIL) 28.3 % POWD; Mix with 8 ounces of water as  directed daily  2. Gastroesophageal reflux disease, esophagitis presence not specified Refills provided. - pantoprazole (PROTONIX) 40 MG tablet; Take 1 tablet (40 mg total) by mouth daily.  Dispense: 30 tablet; Refill: 3  3. Acute anxiety Stable. Refills provided. - LORazepam (ATIVAN) 1 MG tablet; Take 1/2 tablet twice daily  Dispense: 30 tablet; Refill: 5  4. Dementia without behavioral disturbance, unspecified dementia type Stable. FU in March as scheduled.    Patient seen and examined by Miguel Aschoff, MD, and note scribed by Renaldo Fiddler, CMA. I have done the exam and reviewed the above chart and it is accurate to the best of my knowledge. Development worker, community has been used in this note in any air is in the dictation or transcription are unintentional.  Wilhemena Durie, MD  [place]

## 2016-11-26 LAB — CBC WITH DIFFERENTIAL/PLATELET
Basophils Absolute: 0.1 10*3/uL (ref 0.0–0.2)
Basos: 1 %
EOS (ABSOLUTE): 0.4 10*3/uL (ref 0.0–0.4)
EOS: 3 %
HEMATOCRIT: 36.4 % (ref 34.0–46.6)
Hemoglobin: 11.5 g/dL (ref 11.1–15.9)
Immature Grans (Abs): 0 10*3/uL (ref 0.0–0.1)
Immature Granulocytes: 0 %
LYMPHS ABS: 2.4 10*3/uL (ref 0.7–3.1)
Lymphs: 24 %
MCH: 29.6 pg (ref 26.6–33.0)
MCHC: 31.6 g/dL (ref 31.5–35.7)
MCV: 94 fL (ref 79–97)
MONOS ABS: 0.5 10*3/uL (ref 0.1–0.9)
Monocytes: 5 %
Neutrophils Absolute: 7 10*3/uL (ref 1.4–7.0)
Neutrophils: 67 %
Platelets: 244 10*3/uL (ref 150–379)
RBC: 3.89 x10E6/uL (ref 3.77–5.28)
RDW: 14.3 % (ref 12.3–15.4)
WBC: 10.4 10*3/uL (ref 3.4–10.8)

## 2016-11-26 LAB — COMPREHENSIVE METABOLIC PANEL
A/G RATIO: 1.6 (ref 1.2–2.2)
ALBUMIN: 4.1 g/dL (ref 3.5–4.8)
ALT: 9 IU/L (ref 0–32)
AST: 10 IU/L (ref 0–40)
Alkaline Phosphatase: 55 IU/L (ref 39–117)
BUN / CREAT RATIO: 19 (ref 12–28)
BUN: 35 mg/dL — ABNORMAL HIGH (ref 8–27)
Bilirubin Total: 0.2 mg/dL (ref 0.0–1.2)
CALCIUM: 9.2 mg/dL (ref 8.7–10.3)
CO2: 18 mmol/L (ref 18–29)
Chloride: 109 mmol/L — ABNORMAL HIGH (ref 96–106)
Creatinine, Ser: 1.86 mg/dL — ABNORMAL HIGH (ref 0.57–1.00)
GFR calc Af Amer: 30 mL/min/{1.73_m2} — ABNORMAL LOW (ref >59)
GFR, EST NON AFRICAN AMERICAN: 26 mL/min/{1.73_m2} — AB (ref >59)
GLOBULIN, TOTAL: 2.5 g/dL (ref 1.5–4.5)
Glucose: 97 mg/dL (ref 65–99)
POTASSIUM: 5.3 mmol/L — AB (ref 3.5–5.2)
SODIUM: 142 mmol/L (ref 134–144)
Total Protein: 6.6 g/dL (ref 6.0–8.5)

## 2016-11-26 LAB — TSH: TSH: 0.738 u[IU]/mL (ref 0.450–4.500)

## 2016-12-04 ENCOUNTER — Telehealth: Payer: Self-pay | Admitting: Family Medicine

## 2016-12-04 NOTE — Telephone Encounter (Signed)
Called Pt to schedule AWV with [facility] - knb °

## 2016-12-16 ENCOUNTER — Ambulatory Visit (INDEPENDENT_AMBULATORY_CARE_PROVIDER_SITE_OTHER): Payer: Medicare Other

## 2016-12-16 VITALS — BP 132/56 | HR 56 | Temp 97.6°F | Ht 60.0 in | Wt 95.8 lb

## 2016-12-16 DIAGNOSIS — Z0000 Encounter for general adult medical examination without abnormal findings: Secondary | ICD-10-CM | POA: Diagnosis not present

## 2016-12-16 NOTE — Progress Notes (Signed)
Subjective:   Sarah [name] is a 76 y.o. female who presents for Medicare Annual (Subsequent) preventive examination.  Review of Systems:  N/A  Cardiac Risk Factors include: advanced age (>28mn, >>60women);dyslipidemia;hypertension     Objective:     Vitals: BP (!) 132/56 (BP Location: Right Arm)   Pulse (!) 56   Temp 97.6 F (36.4 C) (Oral)   Ht 5' (1.524 m)   Wt 95 lb 12.8 oz (43.5 kg)   BMI 18.71 kg/m   Body mass index is 18.71 kg/m.   Tobacco History  Smoking Status  . Current Every Day Smoker  . Packs/day: 0.50  . Years: 40.00  . Types: Cigarettes  Smokeless Tobacco  . Never Used     Ready to quit: No Counseling given: No   Past Medical History:  Diagnosis Date  . Hyperlipidemia    Past Surgical History:  Procedure Laterality Date  . BREAST BIOPSY    . CESAREAN SECTION    . CHOLECYSTECTOMY     Family History  Problem Relation Age of Onset  . Colon cancer Father   . Pancreatic cancer Sister   . GER disease Son   . Alzheimer's disease Sister    History  Sexual Activity  . Sexual activity: Not on file    Outpatient Encounter Prescriptions as of 12/16/2016  Medication Sig  . acetaminophen (TYLENOL) 500 MG tablet Take 500 mg by mouth every 6 (six) hours as needed.  .Marland Kitchenalendronate (FOSAMAX) 70 MG tablet Take 1 tablet (70 mg total) by mouth once a week. Take every Friday. Take with a full glass of water on an empty stomach.  .Marland KitchenamLODipine (NORVASC) 10 MG tablet Take 1 tablet (10 mg total) by mouth daily.  .Marland Kitchenaspirin EC 81 MG tablet Take 81 mg by mouth daily.  .Marland KitchenCALCIUM CARBONATE-VITAMIN D PO Take by mouth.  . Cholecalciferol (VITAMIN D3) 1000 UNITS CAPS Take by mouth.  . cyanocobalamin 100 MCG tablet Take by mouth.  . donepezil (ARICEPT) 5 MG tablet Take 1 tablet (5 mg total) by mouth at bedtime.  .Marland Kitchenlisinopril (PRINIVIL,ZESTRIL) 40 MG tablet Take 1 tablet (40 mg total) by mouth daily.  .Marland KitchenLORazepam (ATIVAN) 1 MG tablet Take 1/2 tablet twice  daily  . lovastatin (MEVACOR) 40 MG tablet Take 1 tablet (40 mg total) by mouth at bedtime.  . mirtazapine (REMERON) 30 MG tablet Take 1 tablet (30 mg total) by mouth at bedtime.  . pantoprazole (PROTONIX) 40 MG tablet Take 1 tablet (40 mg total) by mouth daily.  . Psyllium (METAMUCIL) 28.3 % POWD Mix with 8 ounces of water as directed daily (Patient taking differently: Mix with 8 ounces of water as directed daily)  . Vitamin E 100 UNITS TABS Take by mouth.   No facility-administered encounter medications on file as of 12/16/2016.     Activities of Daily Living In your present state of health, do you have any difficulty performing the following activities: 12/16/2016  Hearing? N  Vision? Y  Difficulty concentrating or making decisions? N  Walking or climbing stairs? Y  Dressing or bathing? N  Doing errands, shopping? Y  Preparing Food and eating ? N  Using the Toilet? N  In the past six months, have you accidently leaked urine? N  Do you have problems with loss of bowel control? N  Managing your Medications? N  Managing your Finances? N  Housekeeping or managing your Housekeeping? N  Some recent data might be hidden  Patient Care Team: Jerrol Banana., MD as PCP - General (Family Medicine)    Assessment:     Exercise Activities and Dietary recommendations Current Exercise Habits: The patient does not participate in regular exercise at present, Exercise limited by: None identified  Goals    None     Fall Risk Fall Risk  12/16/2016 05/02/2015  Falls in the past year? No No   Depression Screen PHQ 2/9 Scores 12/16/2016 05/02/2015  PHQ - 2 Score 0 1  Exception Documentation - Medical reason     Cognitive Function        Immunization History  Administered Date(s) Administered  . Pneumococcal Conjugate-13 08/01/2014  . Pneumococcal Polysaccharide-23 03/01/2013  . Td 02/10/1996   Screening Tests Health Maintenance  Topic Date Due  . INFLUENZA VACCINE   05/27/2016  . TETANUS/TDAP  03/02/2023  . DEXA SCAN  Completed  . PNA vac Low Risk Adult  Completed      Plan:  I have personally reviewed and addressed the Medicare Annual Wellness questionnaire and have noted the following in the patient's chart:  A. Medical and social history B. Use of alcohol, tobacco or illicit drugs  C. Current medications and supplements D. Functional ability and status E.  Nutritional status F.  Physical activity G. Advance directives H. List of other physicians I.  Hospitalizations, surgeries, and ER visits in previous 12 months J.  [place] such as hearing and vision if needed, cognitive and depression L. Referrals and appointments - none  In addition, I have reviewed and discussed with patient certain preventive protocols, quality metrics, and best practice recommendations. A written personalized care plan for preventive services as well as general preventive health recommendations were provided to patient.  See attached scanned questionnaire for additional information.   Signed,  Fabio Neighbors, LPN Nurse Health Advisor   [place] Recommendations: None. I have reviewed the health advisors note, was  available for consultation and I agree with documentation and plan. Miguel Aschoff MD [facility]

## 2016-12-16 NOTE — Patient Instructions (Signed)
Health Maintenance, Female Introduction Adopting a healthy lifestyle and getting preventive care can go a long way to promote health and wellness. Talk with your health care provider about what schedule of regular examinations is right for you. This is a good chance for you to check in with your provider about disease prevention and staying healthy. In between checkups, there are plenty of things you can do on your own. Experts have done a lot of research about which lifestyle changes and preventive measures are most likely to keep you healthy. Ask your health care provider for more information. Weight and diet Eat a healthy diet  Be sure to include plenty of vegetables, fruits, low-fat dairy products, and lean protein.  Do not eat a lot of foods high in solid fats, added sugars, or salt.  Get regular exercise. This is one of the most important things you can do for your health.  Most adults should exercise for at least 150 minutes each week. The exercise should increase your heart rate and make you sweat (moderate-intensity exercise).  Most adults should also do strengthening exercises at least twice a week. This is in addition to the moderate-intensity exercise. Maintain a healthy weight  Body mass index (BMI) is a measurement that can be used to identify possible weight problems. It estimates body fat based on height and weight. Your health care provider can help determine your BMI and help you achieve or maintain a healthy weight.  For females 4 years of age and older:  A BMI below 18.5 is considered underweight.  A BMI of 18.5 to 24.9 is normal.  A BMI of 25 to 29.9 is considered overweight.  A BMI of 30 and above is considered obese. Watch levels of cholesterol and blood lipids  You should start having your blood tested for lipids and cholesterol at 78 years of age, then have this test every 5 years.  You may need to have your cholesterol levels checked more often  if:  Your lipid or cholesterol levels are high.  You are older than 78 years of age.  You are at high risk for heart disease. Cancer screening Lung Cancer  Lung cancer screening is recommended for adults 12-31 years old who are at high risk for lung cancer because of a history of smoking.  A yearly low-dose CT scan of the lungs is recommended for people who:  Currently smoke.  Have quit within the past 15 years.  Have at least a 30-pack-year history of smoking. A pack year is smoking an average of one pack of cigarettes a day for 1 year.  Yearly screening should continue until it has been 15 years since you quit.  Yearly screening should stop if you develop a health problem that would prevent you from having lung cancer treatment. Breast Cancer  Practice breast self-awareness. This means understanding how your breasts normally appear and feel.  It also means doing regular breast self-exams. Let your health care provider know about any changes, no matter how small.  If you are in your 20s or 30s, you should have a clinical breast exam (CBE) by a health care provider every 1-3 years as part of a regular health exam.  If you are 74 or older, have a CBE every year. Also consider having a breast X-ray (mammogram) every year.  If you have a family history of breast cancer, talk to your health care provider about genetic screening.  If you are at high risk for breast cancer,  talk to your health care provider about having an MRI and a mammogram every year.  Breast cancer gene (BRCA) assessment is recommended for women who have family members with BRCA-related cancers. BRCA-related cancers include:  Breast.  Ovarian.  Tubal.  Peritoneal cancers.  Results of the assessment will determine the need for genetic counseling and BRCA1 and BRCA2 testing. Colorectal Cancer  This type of cancer can be detected and often prevented.  Routine colorectal cancer screening usually begins  at 78 years of age and continues through 78 years of age.  Your health care provider may recommend screening at an earlier age if you have risk factors for colon cancer.  Your health care provider may also recommend using home test kits to check for hidden blood in the stool.  A small camera at the end of a tube can be used to examine your colon directly (sigmoidoscopy or colonoscopy). This is done to check for the earliest forms of colorectal cancer.  Routine screening usually begins at age 50.  Direct examination of the colon should be repeated every 5-10 years through 78 years of age. However, you may need to be screened more often if early forms of precancerous polyps or small growths are found. Skin Cancer  Check your skin from head to toe regularly.  Tell your health care provider about any new moles or changes in moles, especially if there is a change in a mole's shape or color.  Also tell your health care provider if you have a mole that is larger than the size of a pencil eraser.  Always use sunscreen. Apply sunscreen liberally and repeatedly throughout the day.  Protect yourself by wearing long sleeves, pants, a wide-brimmed hat, and sunglasses whenever you are outside. Heart disease, diabetes, and high blood pressure  High blood pressure causes heart disease and increases the risk of stroke. High blood pressure is more likely to develop in:  People who have blood pressure in the high end of the normal range (130-139/85-89 mm Hg).  People who are overweight or obese.  People who are African American.  If you are 18-39 years of age, have your blood pressure checked every 3-5 years. If you are 40 years of age or older, have your blood pressure checked every year. You should have your blood pressure measured twice-once when you are at a hospital or clinic, and once when you are not at a hospital or clinic. Record the average of the two measurements. To check your blood pressure  when you are not at a hospital or clinic, you can use:  An automated blood pressure machine at a pharmacy.  A home blood pressure monitor.  If you are between 55 years and 79 years old, ask your health care provider if you should take aspirin to prevent strokes.  Have regular diabetes screenings. This involves taking a blood sample to check your fasting blood sugar level.  If you are at a normal weight and have a low risk for diabetes, have this test once every three years after 78 years of age.  If you are overweight and have a high risk for diabetes, consider being tested at a younger age or more often. Preventing infection Hepatitis B  If you have a higher risk for hepatitis B, you should be screened for this virus. You are considered at high risk for hepatitis B if:  You were born in a country where hepatitis B is common. Ask your health care provider which countries are   considered high risk.  Your parents were born in a high-risk country, and you have not been immunized against hepatitis B (hepatitis B vaccine).  You have HIV or AIDS.  You use needles to inject street drugs.  You live with someone who has hepatitis B.  You have had sex with someone who has hepatitis B.  You get hemodialysis treatment.  You take certain medicines for conditions, including cancer, organ transplantation, and autoimmune conditions. Hepatitis C  Blood testing is recommended for:  Everyone born from 1945 through 1965.  Anyone with known risk factors for hepatitis C. Osteoporosis and menopause  Osteoporosis is a disease in which the bones lose minerals and strength with aging. This can result in serious bone fractures. Your risk for osteoporosis can be identified using a bone density scan.  If you are 65 years of age or older, or if you are at risk for osteoporosis and fractures, ask your health care provider if you should be screened.  Ask your health care provider whether you should take  a calcium or vitamin D supplement to lower your risk for osteoporosis.  Menopause may have certain physical symptoms and risks.  Hormone replacement therapy may reduce some of these symptoms and risks. Talk to your health care provider about whether hormone replacement therapy is right for you. Follow these instructions at home:  Schedule regular health, dental, and eye exams.  Stay current with your immunizations.  Do not use any tobacco products including cigarettes, chewing tobacco, or electronic cigarettes.  If you are pregnant, do not drink alcohol.  If you are breastfeeding, limit how much and how often you drink alcohol.  Limit alcohol intake to no more than 1 drink per day for nonpregnant women. One drink equals 12 ounces of beer, 5 ounces of wine, or 1 ounces of hard liquor.  Do not use street drugs.  Do not share needles.  Ask your health care provider for help if you need support or information about quitting drugs.  Tell your health care provider if you often feel depressed.  Tell your health care provider if you have ever been abused or do not feel safe at home. This information is not intended to replace advice given to you by your health care provider. Make sure you discuss any questions you have with your health care provider. Document Released: 04/28/2011 Document Revised: 03/20/2016 Document Reviewed: 07/17/2015  2017 Elsevier  

## 2016-12-22 ENCOUNTER — Other Ambulatory Visit
Admission: RE | Admit: 2016-12-22 | Discharge: 2016-12-22 | Disposition: A | Discharge status: Home / Self Care | Payer: Medicare Other | Source: Ambulatory Visit | Attending: Gastroenterology | Admitting: Gastroenterology

## 2016-12-22 ENCOUNTER — Encounter: Payer: Self-pay | Admitting: Gastroenterology

## 2016-12-22 ENCOUNTER — Ambulatory Visit (INDEPENDENT_AMBULATORY_CARE_PROVIDER_SITE_OTHER): Payer: Medicare Other | Admitting: Gastroenterology

## 2016-12-22 VITALS — BP 102/68 | HR 66 | Ht 59.0 in | Wt 94.0 lb

## 2016-12-22 DIAGNOSIS — R197 Diarrhea, unspecified: Secondary | ICD-10-CM

## 2016-12-22 NOTE — Progress Notes (Signed)
Gastroenterology Consultation  Referring Provider:     Jerrol Banana.,* Primary Care Physician:  Wilhemena Durie, MD Primary Gastroenterologist:  Dr. Jonathon Bellows  Reason for Consultation:     Diarrhea         HPI:   Sarah [name] is a 76 y.o. y/o female referred for consultation & management  by Dr. Wilhemena Durie, MD.    She has been referred for diarrhea.  She is here today with her grand son and he states her memory is not great. She was started on Aricept in 08/2016 . Thinks the diarrhea was after she started Aricept.   Diarrhea :  Onset: last 3-4 months upto 10 bowel movements a day    Number of bowel movements a day :   10-12 times a day  Color : brown   Consistency:  Per patient " her normal "  Present status: Was started on metamucil - and presently has 8-10 visits to the rest room- per patient she doesn't always have a bowel movement but more of a sensation    Shape of stool:  "normal for me"   Weight loss:  She had lost some weight presently - but gained  Prior colonoscopy:  > 5 years   Artificial sugars/sodas/chewing gum:  None    Bloating:  No   Gas:  No  Antibiotic use: no   She is on meloxicam , donepezil   TSH,LFT's normal. Creatinine 1.86. Hb 11.5 , mcv 94  The patient is a difficult historian, grand son says he has actually not witnessed any diarrhea as he hasnt seen her bowel movement. Patient repeats she is feeling better presently .    Past Medical History:  Diagnosis Date  . Hyperlipidemia     Past Surgical History:  Procedure Laterality Date  . BREAST BIOPSY    . CESAREAN SECTION    . CHOLECYSTECTOMY      Prior to Admission medications   Medication Sig Start Date End Date Taking? Authorizing Provider  acetaminophen (TYLENOL) 500 MG tablet Take 500 mg by mouth every 6 (six) hours as needed.   Yes Historical Provider, MD  alendronate (FOSAMAX) 70 MG tablet Take 1 tablet (70 mg total) by mouth once a week. Take every  Friday. Take with a full glass of water on an empty stomach. 09/25/16  Yes Richard Maceo Pro., MD  amLODipine (NORVASC) 10 MG tablet Take 1 tablet (10 mg total) by mouth daily. 07/03/16  Yes Richard Maceo Pro., MD  aspirin EC 81 MG tablet Take 81 mg by mouth daily.   Yes Historical Provider, MD  CALCIUM CARBONATE-VITAMIN D PO Take by mouth.   Yes Historical Provider, MD  Cholecalciferol (VITAMIN D3) 1000 UNITS CAPS Take by mouth.   Yes Historical Provider, MD  cyanocobalamin 100 MCG tablet Take by mouth.   Yes Historical Provider, MD  donepezil (ARICEPT) 5 MG tablet Take 1 tablet (5 mg total) by mouth at bedtime. 09/25/16  Yes Richard Maceo Pro., MD  lisinopril (PRINIVIL,ZESTRIL) 40 MG tablet Take 1 tablet (40 mg total) by mouth daily. 07/03/16  Yes Richard Maceo Pro., MD  LORazepam (ATIVAN) 1 MG tablet Take 1/2 tablet twice daily 11/25/16  Yes Richard Maceo Pro., MD  lovastatin (MEVACOR) 40 MG tablet Take 1 tablet (40 mg total) by mouth at bedtime. 07/03/16  Yes Richard Maceo Pro., MD  mirtazapine (REMERON) 30 MG tablet Take 1 tablet (30 mg total) by  mouth at bedtime. 07/03/16  Yes Richard Maceo Pro., MD  pantoprazole (PROTONIX) 40 MG tablet Take 1 tablet (40 mg total) by mouth daily. 11/25/16  Yes Richard Maceo Pro., MD  Psyllium (METAMUCIL) 28.3 % POWD Mix with 8 ounces of water as directed daily Patient taking differently: Mix with 8 ounces of water as directed daily 11/25/16  Yes Richard Maceo Pro., MD  Vitamin E 100 UNITS TABS Take by mouth.   Yes Historical Provider, MD    Family History  Problem Relation Age of Onset  . Colon cancer Father   . Pancreatic cancer Sister   . GER disease Son   . Alzheimer's disease Sister      Social History  Substance Use Topics  . Smoking status: Current Every Day Smoker    Packs/day: 0.50    Years: 40.00    Types: Cigarettes  . Smokeless tobacco: Never Used  . Alcohol use No    Allergies as of 12/22/2016 - Review Complete  12/22/2016  Allergen Reaction Noted  . Penicillins Hives 03/05/2015    Review of Systems:    All systems reviewed and negative except where noted in HPI.   Physical Exam:  BP 102/68   Pulse 66   Ht '4\' 11"'$  (1.499 m)   Wt 94 lb (42.6 kg)   BMI 18.99 kg/m  No LMP recorded. Patient is postmenopausal. Psych:  Alert and cooperative. Normal mood and affect. General:   Alert,  Well-developed, well-nourished, pleasant and cooperative in NAD Head:  Normocephalic and atraumatic. Eyes:  Sclera clear, no icterus.   Conjunctiva pink. Ears:  Normal auditory acuity. Nose:  No deformity, discharge, or lesions. Mouth:  No deformity or lesions,oropharynx pink & moist. Neck:  Supple; no masses or thyromegaly. Lungs:  Respirations even and unlabored.  Clear throughout to auscultation.   No wheezes, crackles, or rhonchi. No acute distress. Heart:  Regular rate and rhythm; no murmurs, clicks, rubs, or gallops. Abdomen:  Normal bowel sounds.  No bruits.  Soft, non-tender and non-distended without masses, hepatosplenomegaly or hernias noted.  No guarding or rebound tenderness.    Msk:  Symmetrical without gross deformities. Good, equal movement & strength bilaterally. Pulses:  Normal pulses noted. Extremities:  No clubbing or edema.  No cyanosis. Neurologic:  Alert and oriented x3;  grossly normal neurologically. Psych:  Alert and cooperative. Normal mood and affect.  Imaging Studies: No results found.  Assessment and Plan:   Sarah [name] is a 76 y.o. y/o female has been referred for diarrhea. She has a history of dementia and is on Aricept. Per grandson , the diarrhea began after commencing on Aricpet in 08/2016 which can cause diarrhea in upto 15 %. Presently on metamucil and she denies having diarrhea but grand son feels she continues to have diarrhea.   Plan   1. Stool studies  2. If stool studies are negative, see her back in 4 weeks. [place] son has been advised to inspect the bowel  movements and if wishes to take pictures for [place] to determine if she is truly having diarrhea. Next step could include colonoscopy but the patient is adamant she does not want it presently. It may also be related to Aricept. ?aricept drug holiday trial and if it resolves can restart Aricept with imodium.   Follow up in 4 weeks.   Dr Jonathon Bellows MD

## 2016-12-24 ENCOUNTER — Telehealth: Payer: Self-pay

## 2016-12-24 LAB — CELIAC DISEASE PANEL
ENDOMYSIAL ANTIBODY IGA: NEGATIVE
IGA: 132 mg/dL (ref 64–422)
TISSUE TRANSGLUTAMINASE AB, IGA: 3 U/mL (ref 0–3)

## 2016-12-24 NOTE — Telephone Encounter (Signed)
Advised pt celiac was normal, per Dr. Georgeann Oppenheim notes.

## 2016-12-24 NOTE — Telephone Encounter (Signed)
-----   Message from Jonathon Bellows, MD sent at 12/24/2016 11:15 AM EST ----- Normal celiac serology

## 2016-12-25 ENCOUNTER — Ambulatory Visit: Payer: Medicare Other | Admitting: Family Medicine

## 2017-01-12 ENCOUNTER — Encounter: Payer: Self-pay | Admitting: Family Medicine

## 2017-01-12 ENCOUNTER — Ambulatory Visit (INDEPENDENT_AMBULATORY_CARE_PROVIDER_SITE_OTHER): Payer: Medicare Other | Admitting: Family Medicine

## 2017-01-12 VITALS — BP 112/60 | HR 64 | Temp 97.6°F | Resp 16 | Wt 96.0 lb

## 2017-01-12 DIAGNOSIS — M810 Age-related osteoporosis without current pathological fracture: Secondary | ICD-10-CM | POA: Diagnosis not present

## 2017-01-12 DIAGNOSIS — E785 Hyperlipidemia, unspecified: Secondary | ICD-10-CM

## 2017-01-12 DIAGNOSIS — F0390 Unspecified dementia without behavioral disturbance: Secondary | ICD-10-CM

## 2017-01-12 DIAGNOSIS — I10 Essential (primary) hypertension: Secondary | ICD-10-CM

## 2017-01-12 DIAGNOSIS — K219 Gastro-esophageal reflux disease without esophagitis: Secondary | ICD-10-CM

## 2017-01-12 NOTE — Progress Notes (Signed)
Subjective:  HPI Pt is here for a 3 month follow up. She reports that she has been doing well. Her memory is about the same per her grandson. She is taking Donepezil. She was started on Fosamax for her Osteoporosis last OV and she reports that she is taking this weekly and seems to be doing well on it. She is taking Metamucil for the diarrhea. She was seen by GI for this and they did stool studies. She has a follow up for this with GI.   Grandson would like to know if she could possible come off her statin with the memory impairment. She has not had lipids done in a year.  Prior to Admission medications   Medication Sig Start Date End Date Taking? Authorizing Provider  acetaminophen (TYLENOL) 500 MG tablet Take 500 mg by mouth every 6 (six) hours as needed.    Historical Provider, MD  alendronate (FOSAMAX) 70 MG tablet Take 1 tablet (70 mg total) by mouth once a week. Take every Friday. Take with a full glass of water on an empty stomach. 09/25/16   Rupert Azzara Maceo Pro., MD  amLODipine (NORVASC) 10 MG tablet Take 1 tablet (10 mg total) by mouth daily. 07/03/16   Jerrol Banana., MD  aspirin EC 81 MG tablet Take 81 mg by mouth daily.    Historical Provider, MD  CALCIUM CARBONATE-VITAMIN D PO Take by mouth.    Historical Provider, MD  Cholecalciferol (VITAMIN D3) 1000 UNITS CAPS Take by mouth.    Historical Provider, MD  cyanocobalamin 100 MCG tablet Take by mouth.    Historical Provider, MD  donepezil (ARICEPT) 5 MG tablet Take 1 tablet (5 mg total) by mouth at bedtime. 09/25/16   Ravindra Baranek Maceo Pro., MD  lisinopril (PRINIVIL,ZESTRIL) 40 MG tablet Take 1 tablet (40 mg total) by mouth daily. 07/03/16   Analy Bassford Maceo Pro., MD  LORazepam (ATIVAN) 1 MG tablet Take 1/2 tablet twice daily 11/25/16   Jerrol Banana., MD  lovastatin (MEVACOR) 40 MG tablet Take 1 tablet (40 mg total) by mouth at bedtime. 07/03/16   Alison Kubicki Maceo Pro., MD  mirtazapine (REMERON) 30 MG tablet Take 1 tablet  (30 mg total) by mouth at bedtime. 07/03/16   Rolanda Campa Maceo Pro., MD  pantoprazole (PROTONIX) 40 MG tablet Take 1 tablet (40 mg total) by mouth daily. 11/25/16   Kelleen Stolze Maceo Pro., MD  Psyllium (METAMUCIL) 28.3 % POWD Mix with 8 ounces of water as directed daily Patient taking differently: Mix with 8 ounces of water as directed daily 11/25/16   Jerrol Banana., MD  Vitamin E 100 UNITS TABS Take by mouth.    Historical Provider, MD    Patient Active Problem List   Diagnosis Date Noted  . Personal history of tobacco use, presenting hazards to health 06/23/2016  . Depression 05/29/2015  . Dementia 05/29/2015  . Allergic rhinitis 03/05/2015  . Weak pulse 03/05/2015  . Smokes tobacco daily 03/05/2015  . Anxiety, generalized 03/05/2015  . Acid reflux 03/05/2015  . HLD (hyperlipidemia) 03/05/2015  . Affective disorder, major 03/05/2015  . Bad memory 03/05/2015  . Arthritis, degenerative 03/05/2015  . OP (osteoporosis) 03/05/2015  . Hypertension, renovascular 03/05/2015  . Scoliosis 03/05/2015  . Avitaminosis D 03/05/2015  . Weight loss 03/05/2015  . Family history of colon cancer 11/07/2014    Past Medical History:  Diagnosis Date  . Hyperlipidemia     Social History  Social History  . Marital status: Widowed    Spouse name: N/A  . Number of children: N/A  . Years of education: N/A   Occupational History  . Not on file.   Social History Main Topics  . Smoking status: Current Every Day Smoker    Packs/day: 0.50    Years: 40.00    Types: Cigarettes  . Smokeless tobacco: Never Used  . Alcohol use No  . Drug use: No  . Sexual activity: Not on file   Other Topics Concern  . Not on file   Social History Narrative  . No narrative on file    Allergies  Allergen Reactions  . Penicillins Hives    Review of Systems  Constitutional: Negative.   HENT: Negative.   Eyes: Negative.   Respiratory: Negative.   Cardiovascular: Negative.   Gastrointestinal:  Positive for diarrhea.  Genitourinary: Negative.   Musculoskeletal: Negative.   Skin: Negative.   Neurological: Negative.   Endo/Heme/Allergies: Negative.   Psychiatric/Behavioral: Positive for memory loss.    Immunization History  Administered Date(s) Administered  . Pneumococcal Conjugate-13 08/01/2014  . Pneumococcal Polysaccharide-23 03/01/2013  . Td 02/10/1996    Objective:  BP 112/60 (BP Location: Left Arm, Patient Position: Sitting, Cuff Size: Normal)   Pulse 64   Temp 97.6 F (36.4 C) (Oral)   Resp 16   Wt 96 lb (43.5 kg)   BMI 19.39 kg/m   Physical Exam  Constitutional: She is oriented to person, place, and time and well-developed, well-nourished, and in no distress.  Eyes: Conjunctivae and EOM are normal. Pupils are equal, round, and reactive to light.  Neck: Normal range of motion. Neck supple.  Cardiovascular: Normal rate, regular rhythm, normal heart sounds and intact distal pulses.   Pulmonary/Chest: Effort normal and breath sounds normal.  Abdominal: Soft. Bowel sounds are normal.  Musculoskeletal: Normal range of motion.  Neurological: She is alert and oriented to person, place, and time. She has normal reflexes. Gait normal. GCS score is 15.  Skin: Skin is warm and dry.  Psychiatric: Mood normal.    Lab Results  Component Value Date   WBC 10.4 11/25/2016   HGB 12.8 08/04/2014   HCT 36.4 11/25/2016   PLT 244 11/25/2016   GLUCOSE 97 11/25/2016   CHOL 165 05/30/2015   TRIG 115 05/30/2015   HDL 51 05/30/2015   LDLCALC 91 05/30/2015   TSH 0.738 11/25/2016    CMP     Component Value Date/Time   NA 142 11/25/2016 1046   NA 141 11/21/2014 1234   K 5.3 (H) 11/25/2016 1046   K 3.9 11/21/2014 1234   CL 109 (H) 11/25/2016 1046   CL 110 (H) 11/21/2014 1234   CO2 18 11/25/2016 1046   CO2 24 11/21/2014 1234   GLUCOSE 97 11/25/2016 1046   GLUCOSE 91 11/21/2014 1234   BUN 35 (H) 11/25/2016 1046   BUN 29 (H) 11/21/2014 1234   CREATININE 1.86 (H)  11/25/2016 1046   CREATININE 1.48 (H) 11/21/2014 1234   CALCIUM 9.2 11/25/2016 1046   CALCIUM 8.9 11/21/2014 1234   PROT 6.6 11/25/2016 1046   ALBUMIN 4.1 11/25/2016 1046   AST 10 11/25/2016 1046   ALT 9 11/25/2016 1046   ALKPHOS 55 11/25/2016 1046   BILITOT 0.2 11/25/2016 1046   GFRNONAA 26 (L) 11/25/2016 1046   GFRNONAA 37 (L) 11/21/2014 1234   GFRAA 30 (L) 11/25/2016 1046   GFRAA 44 (L) 11/21/2014 1234    Assessment and Plan :  1. Essential hypertension Stable.   2. Gastroesophageal reflux disease, esophagitis presence not specified   3. Dementia without behavioral disturbance, unspecified dementia type Stable. Continue Donepezil.   4. Osteoporosis, unspecified osteoporosis type, unspecified pathological fracture presence Continue Fosamax.   5. Hyperlipidemia, unspecified hyperlipidemia type Will stop statin to see if this can help memory. Check labs today and Follow up in 4 months. Stop at family request. - Lipid Panel With LDL/HDL Ratio   HPI, Exam, and A&P Transcribed under the direction and in the presence of Travers Goodley L. Cranford Mon, MD  Electronically Signed: Katina Dung, CMA I have done the exam and reviewed the above chart and it is accurate to the best of my knowledge. Development worker, community has been used in this note in any air is in the dictation or transcription are unintentional.  [facility] 01/12/2017 11:35 AM

## 2017-01-12 NOTE — Patient Instructions (Addendum)
Stop Lovastatin and get labs.

## 2017-01-13 LAB — LIPID PANEL WITH LDL/HDL RATIO
Cholesterol, Total: 174 mg/dL (ref 100–199)
HDL: 61 mg/dL (ref >39)
LDL Calculated: 88 mg/dL (ref 0–99)
LDL/HDL RATIO: 1.4 ratio (ref 0.0–3.2)
Triglycerides: 123 mg/dL (ref 0–149)
VLDL CHOLESTEROL CAL: 25 mg/dL (ref 5–40)

## 2017-01-14 ENCOUNTER — Other Ambulatory Visit (INDEPENDENT_AMBULATORY_CARE_PROVIDER_SITE_OTHER): Payer: Self-pay | Admitting: Vascular Surgery

## 2017-01-14 DIAGNOSIS — I701 Atherosclerosis of renal artery: Secondary | ICD-10-CM

## 2017-01-16 ENCOUNTER — Other Ambulatory Visit
Admission: RE | Admit: 2017-01-16 | Discharge: 2017-01-16 | Disposition: A | Discharge status: Home / Self Care | Payer: Medicare Other | Source: Ambulatory Visit | Attending: Gastroenterology | Admitting: Gastroenterology

## 2017-01-16 DIAGNOSIS — R197 Diarrhea, unspecified: Secondary | ICD-10-CM | POA: Diagnosis not present

## 2017-01-16 LAB — GASTROINTESTINAL PANEL BY PCR, STOOL (REPLACES STOOL CULTURE)

## 2017-01-16 LAB — C DIFFICILE QUICK SCREEN W PCR REFLEX
C DIFFICILE (CDIFF) TOXIN: NEGATIVE
C DIFFICLE (CDIFF) ANTIGEN: NEGATIVE
C Diff interpretation: NOT DETECTED

## 2017-01-16 LAB — LACTOFERRIN, FECAL, QUALITATIVE: LACTOFERRIN, FECAL, QUAL: NEGATIVE

## 2017-01-21 LAB — O&P RESULT

## 2017-01-21 LAB — OVA + PARASITE EXAM

## 2017-01-26 ENCOUNTER — Ambulatory Visit (INDEPENDENT_AMBULATORY_CARE_PROVIDER_SITE_OTHER): Payer: Medicare Other

## 2017-01-26 ENCOUNTER — Encounter (INDEPENDENT_AMBULATORY_CARE_PROVIDER_SITE_OTHER): Payer: Self-pay | Admitting: Vascular Surgery

## 2017-01-26 ENCOUNTER — Ambulatory Visit (INDEPENDENT_AMBULATORY_CARE_PROVIDER_SITE_OTHER): Payer: Medicare Other | Admitting: Vascular Surgery

## 2017-01-26 VITALS — BP 146/66 | HR 51 | Resp 17 | Wt 95.0 lb

## 2017-01-26 DIAGNOSIS — I150 Renovascular hypertension: Secondary | ICD-10-CM

## 2017-01-26 DIAGNOSIS — E785 Hyperlipidemia, unspecified: Secondary | ICD-10-CM | POA: Diagnosis not present

## 2017-01-26 DIAGNOSIS — F17200 Nicotine dependence, unspecified, uncomplicated: Secondary | ICD-10-CM

## 2017-01-26 DIAGNOSIS — Z720 Tobacco use: Secondary | ICD-10-CM | POA: Diagnosis not present

## 2017-01-26 DIAGNOSIS — I701 Atherosclerosis of renal artery: Secondary | ICD-10-CM

## 2017-01-26 NOTE — Progress Notes (Signed)
Subjective:    Patient ID: Sarah [name], female    DOB: [birth year], 76 y.o.   MRN: [id number] Chief Complaint  Patient presents with  . Follow-up   The patient presents for yearly renal artery stenosis follow up. The patient is s/p a right renal artery stent on 11/21/14. The patient underwent a renal artery duplex exam which was notable no right renal artery stent stenosis and no left renal artery stenosis. The patient reports good HTN control and no issues with their kidney function.    Review of Systems  All other systems reviewed and are negative.     Objective:   Physical Exam  Constitutional: She is oriented to person, place, and time. She appears well-developed and well-nourished. No distress.  HENT:  Head: Normocephalic and atraumatic.  Eyes: Conjunctivae are normal. Pupils are equal, round, and reactive to light.  Neck: Normal range of motion.  Cardiovascular: Normal rate, regular rhythm and normal heart sounds.   Pulses:      Radial pulses are 2+ on the right side, and 2+ on the left side.  Pulmonary/Chest: Effort normal.  Musculoskeletal: Normal range of motion. She exhibits no edema.  Neurological: She is alert and oriented to person, place, and time.  Skin: Skin is warm and dry. She is not diaphoretic.  Psychiatric: She has a normal mood and affect. Her behavior is normal. Judgment and thought content normal.   BP (!) 146/66   Pulse (!) 51   Resp 17   Wt 95 lb (43.1 kg)   BMI 19.19 kg/m   Past Medical History:  Diagnosis Date  . Hyperlipidemia    Social History   Social History  . Marital status: Widowed    Spouse name: N/A  . Number of children: N/A  . Years of education: N/A   Occupational History  . Not on file.   Social History Main Topics  . Smoking status: Current Every Day Smoker    Packs/day: 0.50    Years: 40.00    Types: Cigarettes  . Smokeless tobacco: Never Used  . Alcohol use No  . Drug use: No  . Sexual activity: Not on  file   Other Topics Concern  . Not on file   Social History Narrative  . No narrative on file   Past Surgical History:  Procedure Laterality Date  . BREAST BIOPSY    . CESAREAN SECTION    . CHOLECYSTECTOMY     Family History  Problem Relation Age of Onset  . Colon cancer Father   . Pancreatic cancer Sister   . GER disease Son   . Alzheimer's disease Sister    Allergies  Allergen Reactions  . Penicillins Hives      Assessment & Plan:  The patient presents for yearly renal artery stenosis follow up. The patient is s/p a right renal artery stent on 11/21/14. The patient underwent a renal artery duplex exam which was notable no right renal artery stent stenosis and no left renal artery stenosis. The patient reports good HTN control and no issues with their kidney function.   1. Renal artery stenosis (HCC) - Stable Studies reviewed. Asymptomatic, physical exam unremarkable, duplex stable. No intervention at this time.  Patient to continue medical optimization with ASA, antihypertensives and dyslipidemia medication. We will continue to surveil with a renal duplex and will see the patient back in one year. I have discussed with the patient at length the risk factors for and pathogenesis of atherosclerotic disease  and encouraged a healthy diet, regular exercise regimen and blood pressure / glucose control.  Patient was instructed to contact our office in the interim with problems such as increasing / uncontrollable hypertension or changes in kidney function. The patient expresses their understanding.  - VAS US RENAL ARTERY DUPLEX; Future  2. Hypertension, renovascular - Stable Patient reports good hypertension control. On appropriate medications.   3. Smokes tobacco daily - Stable I have discussed (approximately 5 minutes) with the patient the role of tobacco in the pathogenesis of atherosclerosis and its effect on the progression of the disease, impact on the durability of  interventions and its limitations on the formation of collateral pathways. I have recommended absolute tobacco cessation. I have discussed various options available for assistance with tobacco cessation including over the counter methods (Nicotine gum, patch and lozenges). We also discussed prescription options (Chantix, Nicotine Inhaler / Nasal Spray). The patient is not interested in pursuing any prescription tobacco cessation options at this time. The patient voices their understanding.   4. Hyperlipidemia, unspecified hyperlipidemia type - Stable Encouraged good control as its slows the progression of atherosclerotic disease  Current Outpatient Prescriptions on File Prior to Visit  Medication Sig Dispense Refill  . acetaminophen (TYLENOL) 500 MG tablet Take 500 mg by mouth every 6 (six) hours as needed.    Marland Kitchen alendronate (FOSAMAX) 70 MG tablet Take 1 tablet (70 mg total) by mouth once a week. Take every Friday. Take with a full glass of water on an empty stomach. 4 tablet 11  . amLODipine (NORVASC) 10 MG tablet Take 1 tablet (10 mg total) by mouth daily. 90 tablet 1  . aspirin EC 81 MG tablet Take 81 mg by mouth daily.    Marland Kitchen CALCIUM CARBONATE-VITAMIN D PO Take by mouth.    . Cholecalciferol (VITAMIN D3) 1000 UNITS CAPS Take by mouth.    . cyanocobalamin 100 MCG tablet Take by mouth.    . donepezil (ARICEPT) 5 MG tablet Take 1 tablet (5 mg total) by mouth at bedtime. 90 tablet 3  . lisinopril (PRINIVIL,ZESTRIL) 40 MG tablet Take 1 tablet (40 mg total) by mouth daily. 90 tablet 1  . LORazepam (ATIVAN) 1 MG tablet Take 1/2 tablet twice daily 30 tablet 5  . lovastatin (MEVACOR) 40 MG tablet Take 1 tablet (40 mg total) by mouth at bedtime. 90 tablet 1  . mirtazapine (REMERON) 30 MG tablet Take 1 tablet (30 mg total) by mouth at bedtime. 90 tablet 1  . pantoprazole (PROTONIX) 40 MG tablet Take 1 tablet (40 mg total) by mouth daily. 30 tablet 3  . Psyllium (METAMUCIL) 28.3 % POWD Mix with 8 ounces of  water as directed daily (Patient taking differently: Mix with 8 ounces of water as directed daily)    . Vitamin E 100 UNITS TABS Take by mouth.     No current facility-administered medications on file prior to visit.     There are no Patient Instructions on file for this visit. No Follow-up on file.   Camry Robello A Orlyn Odonoghue, PA-C

## 2017-01-28 ENCOUNTER — Ambulatory Visit (INDEPENDENT_AMBULATORY_CARE_PROVIDER_SITE_OTHER): Payer: Medicare Other | Admitting: Gastroenterology

## 2017-01-28 ENCOUNTER — Encounter: Payer: Self-pay | Admitting: Gastroenterology

## 2017-01-28 VITALS — BP 114/60 | HR 49 | Temp 97.7°F | Ht 59.0 in | Wt 94.4 lb

## 2017-01-28 DIAGNOSIS — K591 Functional diarrhea: Secondary | ICD-10-CM | POA: Diagnosis not present

## 2017-01-28 NOTE — Progress Notes (Signed)
Primary Care Physician: Wilhemena Durie, MD  Primary Gastroenterologist:  Dr. Jonathon Bellows   Chief Complaint  Patient presents with  . Follow-up    HPI: Sarah [name] is a 76 y.o. female here for follow up for diarrhea . She was initially seen in 12/22/16 when she had diarrhea upto 10 times a day   The patient was a difficult historian and it wasn't clear if she just had a sensation to have a bowel movement or if she truly had diarrhea.   Interval history 11/2016-01/2017   Stool PCR and C diff , fecal lactoferrin was negative. Celiac serology was negative. Weight stable .  She is here today with her grandson who confirm no diarrhea but rather a sensation. Appears she drinks a lot of water, has a sensation of need to pass urine very often and accompanied with sensation to have a bowel movement .   BP 114/60 (BP Location: Left Arm, Patient Position: Sitting, Cuff Size: Normal)   Pulse (!) 49   Temp 97.7 F (36.5 C) (Oral)   Ht '4\' 11"'$  (1.499 m)   Wt 94 lb 6.4 oz (42.8 kg)   BMI 19.07 kg/m     Current Outpatient Prescriptions  Medication Sig Dispense Refill  . acetaminophen (TYLENOL) 500 MG tablet Take 500 mg by mouth every 6 (six) hours as needed.    Marland Kitchen alendronate (FOSAMAX) 70 MG tablet Take 1 tablet (70 mg total) by mouth once a week. Take every Friday. Take with a full glass of water on an empty stomach. 4 tablet 11  . amLODipine (NORVASC) 10 MG tablet Take 1 tablet (10 mg total) by mouth daily. 90 tablet 1  . aspirin EC 81 MG tablet Take 81 mg by mouth daily.    Marland Kitchen CALCIUM CARBONATE-VITAMIN D PO Take by mouth.    . Cholecalciferol (VITAMIN D3) 1000 UNITS CAPS Take by mouth.    . cyanocobalamin 100 MCG tablet Take by mouth.    . donepezil (ARICEPT) 5 MG tablet Take 1 tablet (5 mg total) by mouth at bedtime. 90 tablet 3  . lisinopril (PRINIVIL,ZESTRIL) 40 MG tablet Take 1 tablet (40 mg total) by mouth daily. 90 tablet 1  . LORazepam (ATIVAN) 1 MG tablet Take 1/2 tablet  twice daily 30 tablet 5  . mirtazapine (REMERON) 30 MG tablet Take 1 tablet (30 mg total) by mouth at bedtime. 90 tablet 1  . pantoprazole (PROTONIX) 40 MG tablet Take 1 tablet (40 mg total) by mouth daily. 30 tablet 3  . Psyllium (METAMUCIL) 28.3 % POWD Mix with 8 ounces of water as directed daily (Patient taking differently: Mix with 8 ounces of water as directed daily)    . Vitamin E 100 UNITS TABS Take by mouth.    . lovastatin (MEVACOR) 40 MG tablet Take 1 tablet (40 mg total) by mouth at bedtime. (Patient not taking: Reported on 01/28/2017) 90 tablet 1   No current facility-administered medications for this visit.     Allergies as of 01/28/2017 - Review Complete 01/28/2017  Allergen Reaction Noted  . Penicillins Hives 03/05/2015    ROS:  General: Negative for anorexia, weight loss, fever, chills, fatigue, weakness. ENT: Negative for hoarseness, difficulty swallowing , nasal congestion. CV: Negative for chest pain, angina, palpitations, dyspnea on exertion, peripheral edema.  Respiratory: Negative for dyspnea at rest, dyspnea on exertion, cough, sputum, wheezing.  GI: See history of present illness. GU:  Negative for dysuria, hematuria, urinary incontinence, urinary frequency, nocturnal urination.  Endo: Negative for unusual weight change.    Physical Examination:   BP 114/60 (BP Location: Left Arm, Patient Position: Sitting, Cuff Size: Normal)   Pulse (!) 49   Temp 97.7 F (36.5 C) (Oral)   Ht '4\' 11"'$  (1.499 m)   Wt 94 lb 6.4 oz (42.8 kg)   BMI 19.07 kg/m   General: Well-nourished, well-developed in no acute distress.  Eyes: No icterus. Conjunctivae pink. Mouth: Oropharyngeal mucosa moist and pink , no lesions erythema or exudate. Lungs: Clear to auscultation bilaterally. Non-labored. Heart: Regular rate and rhythm, no murmurs rubs or gallops.  Abdomen: Bowel sounds are normal, nontender, nondistended, no hepatosplenomegaly or masses, no abdominal bruits or hernia , no  rebound or guarding.   Extremities: No lower extremity edema. No clubbing or deformities. Neuro: Alert and oriented x 3.  Grossly intact. Skin: Warm and dry, no jaundice.   Psych: Alert and cooperative, normal mood and affect.  Imaging Studies: No results found.  Assessment and Plan:   Sarah [name] is a 76 y.o. y/o female has been referred for diarrhea. No diarrhea reportted but rather a sensation , based on her history above it appears that she has a bladder prolapse with sensation to defecate when her bladder is full. Explained to empty her bladder at regular intervals to avoid back pressure on her colon and sensation to defecate.    Dr Jonathon Bellows  MD Follow up as needed

## 2017-02-11 ENCOUNTER — Other Ambulatory Visit: Payer: Self-pay | Admitting: Family Medicine

## 2017-02-11 DIAGNOSIS — I10 Essential (primary) hypertension: Secondary | ICD-10-CM

## 2017-03-31 ENCOUNTER — Telehealth: Payer: Self-pay | Admitting: *Deleted

## 2017-03-31 DIAGNOSIS — R9389 Abnormal findings on diagnostic imaging of other specified body structures: Secondary | ICD-10-CM

## 2017-03-31 NOTE — Telephone Encounter (Signed)
Notified patients caregiver that annual lung cancer screening low dose CT scan is due currently or will be in near future. Confirmed that patient is within the age range of 55-77, and asymptomatic, (no signs or symptoms of lung cancer). Patient denies illness that would prevent curative treatment for lung cancer if found. Verified smoking history, (current, 40.75). The shared decision making visit was done 06/17/16. Patient is agreeable for CT scan being scheduled.

## 2017-03-31 NOTE — Telephone Encounter (Signed)
Left message for patient to notify them that it is time to schedule annual low dose lung cancer screening CT scan. Instructed patient to call back to verify information prior to the scan being scheduled.  

## 2017-04-07 ENCOUNTER — Ambulatory Visit
Admission: RE | Admit: 2017-04-07 | Discharge: 2017-04-07 | Disposition: A | Discharge status: Home / Self Care | Payer: Medicare Other | Source: Ambulatory Visit | Attending: Oncology | Admitting: Oncology

## 2017-04-07 DIAGNOSIS — J438 Other emphysema: Secondary | ICD-10-CM | POA: Insufficient documentation

## 2017-04-07 DIAGNOSIS — C3490 Malignant neoplasm of unspecified part of unspecified bronchus or lung: Secondary | ICD-10-CM | POA: Diagnosis not present

## 2017-04-07 DIAGNOSIS — Z122 Encounter for screening for malignant neoplasm of respiratory organs: Secondary | ICD-10-CM | POA: Insufficient documentation

## 2017-04-07 DIAGNOSIS — R938 Abnormal findings on diagnostic imaging of other specified body structures: Secondary | ICD-10-CM | POA: Insufficient documentation

## 2017-04-07 DIAGNOSIS — J432 Centrilobular emphysema: Secondary | ICD-10-CM | POA: Diagnosis not present

## 2017-04-07 DIAGNOSIS — I700 Atherosclerosis of aorta: Secondary | ICD-10-CM | POA: Diagnosis not present

## 2017-04-07 DIAGNOSIS — R9389 Abnormal findings on diagnostic imaging of other specified body structures: Secondary | ICD-10-CM

## 2017-04-10 ENCOUNTER — Encounter: Payer: Self-pay | Admitting: *Deleted

## 2017-05-12 ENCOUNTER — Ambulatory Visit (INDEPENDENT_AMBULATORY_CARE_PROVIDER_SITE_OTHER): Payer: Medicare Other | Admitting: Family Medicine

## 2017-05-12 ENCOUNTER — Encounter: Payer: Self-pay | Admitting: Family Medicine

## 2017-05-12 VITALS — BP 138/62 | HR 58 | Temp 98.1°F | Resp 16 | Wt 93.0 lb

## 2017-05-12 DIAGNOSIS — Z720 Tobacco use: Secondary | ICD-10-CM | POA: Diagnosis not present

## 2017-05-12 DIAGNOSIS — F0390 Unspecified dementia without behavioral disturbance: Secondary | ICD-10-CM | POA: Diagnosis not present

## 2017-05-12 DIAGNOSIS — I150 Renovascular hypertension: Secondary | ICD-10-CM

## 2017-05-12 DIAGNOSIS — R634 Abnormal weight loss: Secondary | ICD-10-CM

## 2017-05-12 DIAGNOSIS — F17200 Nicotine dependence, unspecified, uncomplicated: Secondary | ICD-10-CM

## 2017-05-12 DIAGNOSIS — M810 Age-related osteoporosis without current pathological fracture: Secondary | ICD-10-CM

## 2017-05-12 NOTE — Progress Notes (Signed)
Patient: Sarah [name] Female    DOB: 17-Aug-1941   76 y.o.   MRN: [id number] Visit Date: 05/12/2017  Today's Provider: Wilhemena Durie, MD   Chief Complaint  Patient presents with  . Hypertension  . Hyperlipidemia  . Dementia   Subjective:    HPI  Dementia continues to worsen but no behavioral issues.  Hypertension, follow-up:  BP Readings from Last 3 Encounters:  05/12/17 138/62  01/28/17 114/60  01/26/17 (!) 146/66    She was last seen for hypertension 4 months ago.  BP at that visit was 112/60. Management since that visit includes no changes. She reports good compliance with treatment. She is not having side effects.  She is not exercising. She is adherent to low salt diet.   Outside blood pressures are not being checked. She is experiencing none.  Patient denies exertional chest pressure/discomfort, lower extremity edema and palpitations.   Cardiovascular risk factors include dyslipidemia.     Weight trend: stable Wt Readings from Last 3 Encounters:  05/12/17 93 lb (42.2 kg)  01/28/17 94 lb 6.4 oz (42.8 kg)  01/26/17 95 lb (43.1 kg)    Current diet: well balanced    Lipid/Cholesterol, Follow-up:   Last seen for this4 months ago.  Management changes since that visit include stopping lovastatin to see if it will help with her memory. . Last Lipid Panel:    Component Value Date/Time   CHOL 174 01/12/2017 1205   TRIG 123 01/12/2017 1205   HDL 61 01/12/2017 1205   [place] 88 01/12/2017 1205    Risk factors for vascular disease include hypertension  She reports good compliance with treatment. She is not having side effects.  Current symptoms include none and have been stable. Weight trend: stable Prior visit with dietician: no Current diet: well balanced Current exercise: none  Wt Readings from Last 3 Encounters:  05/12/17 93 lb (42.2 kg)  01/28/17 94 lb 6.4 oz (42.8 kg)  01/26/17 95 lb (43.1 kg)         Allergies    Allergen Reactions  . Penicillins Hives     Current Outpatient Prescriptions:  .  acetaminophen (TYLENOL) 500 MG tablet, Take 500 mg by mouth every 6 (six) hours as needed., Disp: , Rfl:  .  alendronate (FOSAMAX) 70 MG tablet, Take 1 tablet (70 mg total) by mouth once a week. Take every Friday. Take with a full glass of water on an empty stomach., Disp: 4 tablet, Rfl: 11 .  amLODipine (NORVASC) 10 MG tablet, TAKE ONE TABLET BY MOUTH ONCE DAILY, Disp: 90 tablet, Rfl: 3 .  aspirin EC 81 MG tablet, Take 81 mg by mouth daily., Disp: , Rfl:  .  CALCIUM CARBONATE-VITAMIN D PO, Take by mouth., Disp: , Rfl:  .  Cholecalciferol (VITAMIN D3) 1000 UNITS CAPS, Take by mouth., Disp: , Rfl:  .  cyanocobalamin 100 MCG tablet, Take by mouth., Disp: , Rfl:  .  donepezil (ARICEPT) 5 MG tablet, Take 1 tablet (5 mg total) by mouth at bedtime., Disp: 90 tablet, Rfl: 3 .  lisinopril (PRINIVIL,ZESTRIL) 40 MG tablet, TAKE ONE TABLET BY MOUTH ONCE DAILY, Disp: 90 tablet, Rfl: 3 .  LORazepam (ATIVAN) 1 MG tablet, Take 1/2 tablet twice daily, Disp: 30 tablet, Rfl: 5 .  mirtazapine (REMERON) 30 MG tablet, Take 1 tablet (30 mg total) by mouth at bedtime., Disp: 90 tablet, Rfl: 1 .  pantoprazole (PROTONIX) 40 MG tablet, Take 1 tablet (40 mg  total) by mouth daily., Disp: 30 tablet, Rfl: 3 .  Psyllium (METAMUCIL) 28.3 % POWD, Mix with 8 ounces of water as directed daily (Patient taking differently: Mix with 8 ounces of water as directed daily), Disp: , Rfl:  .  Vitamin E 100 UNITS TABS, Take by mouth., Disp: , Rfl:  .  lovastatin (MEVACOR) 40 MG tablet, Take 1 tablet (40 mg total) by mouth at bedtime. (Patient not taking: Reported on 01/28/2017), Disp: 90 tablet, Rfl: 1  Review of Systems  Constitutional: Negative.   HENT: Negative.   Eyes: Negative.   Respiratory: Negative.   Cardiovascular: Negative.   Gastrointestinal: Negative.   Endocrine: Negative.   Musculoskeletal: Positive for arthralgias and joint swelling.   Allergic/Immunologic: Negative.   Neurological: Positive for numbness.  Hematological: Negative.   Psychiatric/Behavioral: Negative.     Social History  Substance Use Topics  . Smoking status: Current Every Day Smoker    Packs/day: 0.50    Years: 40.00    Types: Cigarettes  . Smokeless tobacco: Never Used  . Alcohol use No   Objective:   BP 138/62 (BP Location: Left Arm, Patient Position: Sitting, Cuff Size: Normal)   Pulse (!) 58   Temp 98.1 F (36.7 C)   Resp 16   Wt 93 lb (42.2 kg)   SpO2 99%   BMI 18.78 kg/m  Vitals:   05/12/17 1147  BP: 138/62  Pulse: (!) 58  Resp: 16  Temp: 98.1 F (36.7 C)  SpO2: 99%  Weight: 93 lb (42.2 kg)     Physical Exam  Constitutional: She is oriented to person, place, and time. She appears well-developed and well-nourished.  HENT:  Head: Normocephalic and atraumatic.  Right Ear: External ear normal.  Left Ear: External ear normal.  Nose: Nose normal.  Eyes: Conjunctivae are normal. No scleral icterus.  Neck: No thyromegaly present.  Cardiovascular: Normal rate, regular rhythm and normal heart sounds.   Pulmonary/Chest: Effort normal and breath sounds normal.  Abdominal: Soft.  Neurological: She is alert and oriented to person, place, and time.  Skin: Skin is warm and dry.  Psychiatric: She has a normal mood and affect. Her behavior is normal.   MMSE 16/30     Assessment & Plan:     1. Hypertension, renovascular   2. Osteoporosis, unspecified osteoporosis type, unspecified pathological fracture presence   3. Smokes tobacco daily   4. Weight loss   5. Dementia without behavioral disturbance, unspecified dementia type Worsening and grandson has been living with her is going to graduate school. I discussed with him that she will most certainly have to live with someone. Assisted living likely needed. She is not driving. 6.COPD More than 50% spent in counselling.     I have done the exam and reviewed the above  chart and it is accurate to the best of my knowledge. Development worker, community has been used in this note in any air is in the dictation or transcription are unintentional.  Wilhemena Durie, MD  [place]

## 2017-05-12 NOTE — Patient Instructions (Signed)
Take all meds before 12 noon.

## 2017-05-15 DIAGNOSIS — I10 Essential (primary) hypertension: Secondary | ICD-10-CM | POA: Diagnosis not present

## 2017-05-15 DIAGNOSIS — N390 Urinary tract infection, site not specified: Secondary | ICD-10-CM | POA: Diagnosis not present

## 2017-05-15 DIAGNOSIS — N183 Chronic kidney disease, stage 3 (moderate): Secondary | ICD-10-CM | POA: Diagnosis not present

## 2017-05-15 LAB — BASIC METABOLIC PANEL
BUN: 39 — AB (ref 4–21)
CREATININE: 1.6 — AB (ref 0.5–1.1)
Glucose: 155
Potassium: 4.5 (ref 3.4–5.3)
Sodium: 140 (ref 137–147)

## 2017-05-27 ENCOUNTER — Encounter: Payer: Self-pay | Admitting: Family Medicine

## 2017-05-30 ENCOUNTER — Encounter: Payer: Self-pay | Admitting: Emergency Medicine

## 2017-05-30 ENCOUNTER — Emergency Department
Admission: EM | Admit: 2017-05-30 | Discharge: 2017-05-30 | Disposition: A | Discharge status: Home / Self Care | Payer: Medicare Other | Attending: Emergency Medicine | Admitting: Emergency Medicine

## 2017-05-30 ENCOUNTER — Emergency Department: Payer: Medicare Other

## 2017-05-30 DIAGNOSIS — Y999 Unspecified external cause status: Secondary | ICD-10-CM | POA: Insufficient documentation

## 2017-05-30 DIAGNOSIS — Z79899 Other long term (current) drug therapy: Secondary | ICD-10-CM | POA: Diagnosis not present

## 2017-05-30 DIAGNOSIS — I10 Essential (primary) hypertension: Secondary | ICD-10-CM | POA: Insufficient documentation

## 2017-05-30 DIAGNOSIS — Z7982 Long term (current) use of aspirin: Secondary | ICD-10-CM | POA: Insufficient documentation

## 2017-05-30 DIAGNOSIS — S0990XA Unspecified injury of head, initial encounter: Secondary | ICD-10-CM

## 2017-05-30 DIAGNOSIS — Z23 Encounter for immunization: Secondary | ICD-10-CM | POA: Insufficient documentation

## 2017-05-30 DIAGNOSIS — Y9389 Activity, other specified: Secondary | ICD-10-CM | POA: Diagnosis not present

## 2017-05-30 DIAGNOSIS — W228XXA Striking against or struck by other objects, initial encounter: Secondary | ICD-10-CM | POA: Diagnosis not present

## 2017-05-30 DIAGNOSIS — Y929 Unspecified place or not applicable: Secondary | ICD-10-CM | POA: Insufficient documentation

## 2017-05-30 DIAGNOSIS — S0101XA Laceration without foreign body of scalp, initial encounter: Secondary | ICD-10-CM | POA: Insufficient documentation

## 2017-05-30 HISTORY — DX: Essential (primary) hypertension: I10

## 2017-05-30 MED ORDER — TETANUS-DIPHTH-ACELL PERTUSSIS 5-2.5-18.5 LF-MCG/0.5 IM SUSP
0.5000 mL | Freq: Once | INTRAMUSCULAR | Status: AC
  Administered 2017-05-30: 0.5 mL via INTRAMUSCULAR
  Filled 2017-05-30: qty 0.5

## 2017-05-30 NOTE — ED Provider Notes (Signed)
[facility] Emergency Department Provider Note  ____________________________________________   First MD Initiated Contact with Patient 05/30/17 1933     (approximate)  I have reviewed the triage vital signs and the nursing notes.   HISTORY  Chief Complaint Head Laceration   HPI Sarah [name] is a 76 y.o. female with a history of hypertension on aspirin daily who is presenting to the emergency department today after dropping a pole on her head. Her family says that she was using a pole to get figs down off of the sig tree when she dropped a pole on her own head, sustaining a laceration just under the hairline posteriorly to the forehead on the right side of her face. There was no loss of consciousness. The bleeding stopped prior to the patient arriving to the exam room. The patient denies any headache at this time.She does not know the date of her last tetanus shot.   Past Medical History:  Diagnosis Date  . Hyperlipidemia   . Hypertension     Patient Active Problem List   Diagnosis Date Noted  . Renal artery stenosis ([place]) 01/26/2017  . Personal history of tobacco use, presenting hazards to health 06/23/2016  . Depression 05/29/2015  . Dementia 05/29/2015  . Allergic rhinitis 03/05/2015  . Weak pulse 03/05/2015  . Smokes tobacco daily 03/05/2015  . Anxiety, generalized 03/05/2015  . Acid reflux 03/05/2015  . HLD (hyperlipidemia) 03/05/2015  . Affective disorder, major 03/05/2015  . Bad memory 03/05/2015  . Arthritis, degenerative 03/05/2015  . OP (osteoporosis) 03/05/2015  . Hypertension, renovascular 03/05/2015  . Scoliosis 03/05/2015  . Avitaminosis D 03/05/2015  . Weight loss 03/05/2015  . Family history of colon cancer 11/07/2014    Past Surgical History:  Procedure Laterality Date  . BREAST BIOPSY    . CESAREAN SECTION    . CHOLECYSTECTOMY      Prior to Admission medications   Medication Sig Start Date End Date Taking?  Authorizing Provider  acetaminophen (TYLENOL) 500 MG tablet Take 500 mg by mouth every 6 (six) hours as needed.    [provider]  alendronate (FOSAMAX) 70 MG tablet Take 1 tablet (70 mg total) by mouth once a week. Take every Friday. Take with a full glass of water on an empty stomach. 09/25/16   Jerrol Banana., MD  amLODipine (NORVASC) 10 MG tablet TAKE ONE TABLET BY MOUTH ONCE DAILY 02/11/17   Jerrol Banana., MD  aspirin EC 81 MG tablet Take 81 mg by mouth daily.    [provider]  CALCIUM CARBONATE-VITAMIN D PO Take by mouth.    [provider]  Cholecalciferol (VITAMIN D3) 1000 UNITS CAPS Take by mouth.    [provider]  cyanocobalamin 100 MCG tablet Take by mouth.    [provider]  donepezil (ARICEPT) 5 MG tablet Take 1 tablet (5 mg total) by mouth at bedtime. 09/25/16   Jerrol Banana., MD  lisinopril (PRINIVIL,ZESTRIL) 40 MG tablet TAKE ONE TABLET BY MOUTH ONCE DAILY 02/11/17   Jerrol Banana., MD  LORazepam (ATIVAN) 1 MG tablet Take 1/2 tablet twice daily 11/25/16   Jerrol Banana., MD  lovastatin (MEVACOR) 40 MG tablet Take 1 tablet (40 mg total) by mouth at bedtime. Patient not taking: Reported on 01/28/2017 07/03/16   Jerrol Banana., MD  mirtazapine (REMERON) 30 MG tablet Take 1 tablet (30 mg total) by mouth at bedtime. 07/03/16   Rosanna Randy,  Retia Passe., MD  pantoprazole (PROTONIX) 40 MG tablet Take 1 tablet (40 mg total) by mouth daily. 11/25/16   Jerrol Banana., MD  Psyllium (METAMUCIL) 28.3 % POWD Mix with 8 ounces of water as directed daily Patient taking differently: Mix with 8 ounces of water as directed daily 11/25/16   Jerrol Banana., MD  Vitamin E 100 UNITS TABS Take by mouth.    [provider]    Allergies Penicillins  Family History  Problem Relation Age of Onset  . Colon cancer Father   . Pancreatic cancer Sister   . GER disease Son   . Alzheimer's disease  Sister     Social History Social History  Substance Use Topics  . Smoking status: Current Every Day Smoker    Packs/day: 0.50    Years: 40.00    Types: Cigarettes  . Smokeless tobacco: Never Used  . Alcohol use No    Review of Systems  Constitutional: No fever/chills Eyes: No visual changes. ENT: No sore throat. Cardiovascular: Denies chest pain. Respiratory: Denies shortness of breath. Gastrointestinal: No abdominal pain.  No nausea, no vomiting.  No diarrhea.  No constipation. Genitourinary: Negative for dysuria. Musculoskeletal: Negative for back pain. Skin: Negative for rash. Neurological: Negative for headaches, focal weakness or numbness.   ____________________________________________   PHYSICAL EXAM:  VITAL SIGNS: ED Triage Vitals [05/30/17 1925]  Enc Vitals Group     BP 138/68     Pulse Rate (!) 57     Resp 16     Temp 98.2 F (36.8 C)     Temp Source Oral     SpO2 99 %     Weight 93 lb (42.2 kg)     Height 4\' 11"  (1.499 m)     Head Circumference      Peak Flow      Pain Score      Pain Loc      Pain Edu?      Excl. in [place]?     Constitutional: Alert and oriented. Well appearing and in no acute distress. Eyes: Conjunctivae are normal.  Head: Anterior/posterior oriented to send a laceration just within the hairline posterior to the forehead on the right. There is no active bleeding. The edges of the laceration are well approximated. There is no surrounding erythema, induration or pus. There is no depression. There is only mild tenderness to palpation surrounding the laceration. Nose: No congestion/rhinnorhea. Mouth/Throat: Mucous membranes are moist.  Neck: No stridor.  No midline cervical spine tenderness. No deformity nor step-off.  Cardiovascular: Normal rate, regular rhythm. Grossly normal heart sounds.   Respiratory: Normal respiratory effort.  No retractions. Lungs CTAB. Gastrointestinal: Soft and nontender. No distention Musculoskeletal: No  lower extremity tenderness nor edema.  No joint effusions. Neurologic:  Normal speech and language. No gross focal neurologic deficits are appreciated. Skin:  Skin is warm, dry and intact. No rash noted. Psychiatric: Mood and affect are normal. Speech and behavior are normal.  ____________________________________________   LABS (all labs ordered are listed, but only abnormal results are displayed)  Labs Reviewed - No data to display ____________________________________________  EKG   ____________________________________________  RADIOLOGY  CT scan without any acute intracranial pathology. ____________________________________________   PROCEDURES  Procedure(s) performed:   Procedures  Critical Care performed:   ____________________________________________   INITIAL IMPRESSION / ASSESSMENT AND PLAN / ED COURSE  Pertinent labs & imaging results that were available during my care of the patient were reviewed  by me and considered in my medical decision making (see chart for details).     ----------------------------------------- 8:50 PM on 05/30/2017 -----------------------------------------  Patient with reassuring CAT scan. She'll receive a tetanus shot and we will Steri-Strip the wound. Family at bedside. We discussed the imaging results as well as the plan for discharge to home in wound care. The patient and family are understanding and willing to comply. Will be discharged home.  ____________________________________________   FINAL CLINICAL IMPRESSION(S) / ED DIAGNOSES  Scalp laceration. Head injury.    NEW MEDICATIONS STARTED DURING THIS VISIT:  New Prescriptions   No medications on file     Note:  This document was prepared using Dragon voice recognition software and may include unintentional dictation errors.     Orbie Pyo, MD 05/30/17 [phone]

## 2017-05-30 NOTE — ED Notes (Signed)
Family changed their minds about wanting steri-strips. Pt discharged via w/c to car with no c/o pain.

## 2017-05-30 NOTE — ED Triage Notes (Signed)
Patient was at home trying to get something off an upper shelf and it fell and hit her in the head, causing a 1/2" cm lac that is not bleeding.  Family says she has hx of Alzheimers.  Patient in NAD.

## 2017-06-03 ENCOUNTER — Encounter: Payer: Self-pay | Admitting: Family Medicine

## 2017-06-03 ENCOUNTER — Ambulatory Visit (INDEPENDENT_AMBULATORY_CARE_PROVIDER_SITE_OTHER): Payer: Medicare Other | Admitting: Family Medicine

## 2017-06-03 VITALS — BP 118/52 | HR 54 | Temp 97.5°F | Resp 16 | Wt 93.4 lb

## 2017-06-03 DIAGNOSIS — S0101XD Laceration without foreign body of scalp, subsequent encounter: Secondary | ICD-10-CM

## 2017-06-03 MED ORDER — SODIUM CHLORIDE 0.9 % EX SOLN: 1.0000 "application " | Freq: Two times a day (BID) | CUTANEOUS | 0 refills | Status: DC

## 2017-06-03 NOTE — Patient Instructions (Signed)
Just apply saline solution to your scalp twice daily.

## 2017-06-03 NOTE — Progress Notes (Signed)
Subjective:     Patient ID: Tanysha Quant, female   DOB: 10-23-1941, 76 y.o.   MRN: [id number]  HPI  Chief Complaint  Patient presents with  . Follow-up    Patient comes in office today for ER follow up from 05/30/17. Patient was using pole to pull down figs from tree when she hit her self in head sustaining a head laceration on the right side of head near hair line. Patient reports today wthat scalp is still sore to the touch but has been cleaning area with peroxide and neosporin  Accompanied by her grandson today.   Review of Systems     Objective:   Physical Exam  Constitutional: She appears well-developed and well-nourished. No distress.  Skin:  Right anterior scalp with superficial laceration and mild swelling/tenderness. No erythema or drainage.       Assessment:    1. Scalp laceration, subsequent encounter: healing - SODIUM CHLORIDE, EXTERNAL, ([facility] SALINE WOUND [place]) 0.9 % SOLN; Apply 1 application topically 2 (two) times daily.  Dispense: 210 mL; Refill: 0    Plan:    Discussed use of saline cleansing and covering with dressing.

## 2017-08-19 ENCOUNTER — Ambulatory Visit (INDEPENDENT_AMBULATORY_CARE_PROVIDER_SITE_OTHER): Payer: Medicare Other | Admitting: Family Medicine

## 2017-08-19 VITALS — BP 140/80 | HR 64 | Temp 97.9°F | Resp 16 | Wt 91.0 lb

## 2017-08-19 DIAGNOSIS — I150 Renovascular hypertension: Secondary | ICD-10-CM | POA: Diagnosis not present

## 2017-08-19 DIAGNOSIS — F0390 Unspecified dementia without behavioral disturbance: Secondary | ICD-10-CM

## 2017-08-19 NOTE — Progress Notes (Signed)
Malillany Kazlauskas  MRN: [id number] DOB: 07/21/1941  Subjective:  HPI   The patient is a 76 year old female who presents for follow up of chronic disease.  She was last seen on 06/03/17 for an acute visit.    Hypertension- The patient is currently on Amlodipine 10 mg and Lisinopril 40 mg both daily.  Her last readings were:   BP Readings from Last 3 Encounters:  08/19/17 140/80  06/03/17 (!) 118/52  05/30/17 138/68   Dementia- The patient is also here to follow up on her dementia.  Her last 6 CIT was:  6CIT Screen 12/16/2016  What Year? 0 points  What month? 0 points  What time? 0 points  Count back from 20 2 points  Months in reverse 4 points  Repeat phrase 10 points  Total Score 16      Patient Active Problem List   Diagnosis Date Noted  . Renal artery stenosis ([place]) 01/26/2017  . Personal history of tobacco use, presenting hazards to health 06/23/2016  . Depression 05/29/2015  . Dementia 05/29/2015  . Allergic rhinitis 03/05/2015  . Weak pulse 03/05/2015  . Smokes tobacco daily 03/05/2015  . Anxiety, generalized 03/05/2015  . Acid reflux 03/05/2015  . HLD (hyperlipidemia) 03/05/2015  . Affective disorder, major 03/05/2015  . Bad memory 03/05/2015  . Arthritis, degenerative 03/05/2015  . OP (osteoporosis) 03/05/2015  . Hypertension, renovascular 03/05/2015  . Scoliosis 03/05/2015  . Avitaminosis D 03/05/2015  . Weight loss 03/05/2015  . Family history of colon cancer 11/07/2014    Past Medical History:  Diagnosis Date  . Hyperlipidemia   . Hypertension     Social History   Social History  . Marital status: Widowed    Spouse name: N/A  . Number of children: N/A  . Years of education: N/A   Occupational History  . Not on file.   Social History Main Topics  . Smoking status: Current Every Day Smoker    Packs/day: 0.50    Years: 40.00    Types: Cigarettes  . Smokeless tobacco: Never Used  . Alcohol use No  . Drug use: No  . Sexual activity:  Not on file   Other Topics Concern  . Not on file   Social History Narrative  . No narrative on file    Outpatient Encounter Prescriptions as of 08/19/2017  Medication Sig Note  . acetaminophen (TYLENOL) 500 MG tablet Take 500 mg by mouth every 6 (six) hours as needed.   Marland Kitchen alendronate (FOSAMAX) 70 MG tablet Take 1 tablet (70 mg total) by mouth once a week. Take every Friday. Take with a full glass of water on an empty stomach.   Marland Kitchen amLODipine (NORVASC) 10 MG tablet TAKE ONE TABLET BY MOUTH ONCE DAILY   . aspirin EC 81 MG tablet Take 81 mg by mouth daily.   Marland Kitchen CALCIUM CARBONATE-VITAMIN D PO Take by mouth. 03/05/2015: Received from: [facility]  . Cholecalciferol (VITAMIN D3) 1000 UNITS CAPS Take by mouth. 05/02/2015: Received from: [facility]  . cyanocobalamin 100 MCG tablet Take by mouth. 03/05/2015: Received from: [facility]  . donepezil (ARICEPT) 5 MG tablet Take 1 tablet (5 mg total) by mouth at bedtime.   Marland Kitchen lisinopril (PRINIVIL,ZESTRIL) 40 MG tablet TAKE ONE TABLET BY MOUTH ONCE DAILY   . Psyllium (METAMUCIL) 28.3 % POWD Mix with 8 ounces of water as directed daily (Patient taking differently: Mix with 8 ounces of water as directed daily)   .  SODIUM CHLORIDE, EXTERNAL, ([facility] SALINE WOUND [place]) 0.9 % SOLN Apply 1 application topically 2 (two) times daily.   . Vitamin E 100 UNITS TABS Take by mouth. 03/05/2015: Received from: [facility]  . lovastatin (MEVACOR) 40 MG tablet Take 1 tablet (40 mg total) by mouth at bedtime. (Patient not taking: Reported on 08/19/2017)   . mirtazapine (REMERON) 30 MG tablet Take 1 tablet (30 mg total) by mouth at bedtime. (Patient not taking: Reported on 08/19/2017)   . pantoprazole (PROTONIX) 40 MG tablet Take 1 tablet (40 mg total) by mouth daily. (Patient not taking: Reported on 08/19/2017)   . [DISCONTINUED] LORazepam (ATIVAN) 1 MG tablet Take 1/2 tablet twice daily    No  facility-administered encounter medications on file as of 08/19/2017.     Allergies  Allergen Reactions  . Penicillins Hives    Review of Systems  Constitutional: Negative for fever and malaise/fatigue.  Eyes: Negative.   Respiratory: Negative for cough, shortness of breath and wheezing.   Cardiovascular: Negative for chest pain, palpitations and leg swelling.  Gastrointestinal: Negative.   Skin: Negative.   Neurological: Negative.  Negative for weakness.  Endo/Heme/Allergies: Negative.   Psychiatric/Behavioral: Negative.     Objective:  BP 140/80 (BP Location: Right Arm, Patient Position: Sitting, Cuff Size: Normal)   Pulse 64   Temp 97.9 F (36.6 C) (Oral)   Resp 16   Wt 91 lb (41.3 kg)   BMI 18.38 kg/m   Physical Exam  Constitutional:  Cachectic   HENT:  Head: Normocephalic and atraumatic.  Right Ear: External ear normal.  Left Ear: External ear normal.  Nose: Nose normal.  Eyes: Pupils are equal, round, and reactive to light. Conjunctivae are normal. No scleral icterus.  Cataracts present  No nystagmus  Neck: No thyromegaly present.  Cardiovascular: Normal rate, regular rhythm and normal heart sounds.   Pulmonary/Chest: Effort normal and breath sounds normal.  Abdominal: Soft.  Neurological: She is alert. GCS score is 15.  Skin: Skin is warm and dry.  Psychiatric: Mood and affect normal.    Assessment and Plan :  1. Hypertension, renovascular   2. Dementia without behavioral disturbance, unspecified dementia type Pt has stopped driving. Family considering assisted living due to [facility]. 3.Cachexia

## 2017-09-16 ENCOUNTER — Other Ambulatory Visit: Payer: Self-pay | Admitting: Family Medicine

## 2017-09-16 DIAGNOSIS — M1990 Unspecified osteoarthritis, unspecified site: Secondary | ICD-10-CM

## 2017-09-16 MED ORDER — ALENDRONATE SODIUM 70 MG PO TABS: 70.0000 mg | ORAL_TABLET | ORAL | 3 refills | Status: DC

## 2017-09-16 NOTE — Telephone Encounter (Signed)
Done-Ethelle Ola V Korrin Waterfield, RMA  

## 2017-09-16 NOTE — Telephone Encounter (Signed)
[facility] faxed a request for the following medication. Thanks CC  alendronate (FOSAMAX) 70 MG tablet

## 2017-11-18 DIAGNOSIS — I10 Essential (primary) hypertension: Secondary | ICD-10-CM | POA: Diagnosis not present

## 2017-11-18 DIAGNOSIS — N183 Chronic kidney disease, stage 3 (moderate): Secondary | ICD-10-CM | POA: Diagnosis not present

## 2017-11-18 DIAGNOSIS — I701 Atherosclerosis of renal artery: Secondary | ICD-10-CM | POA: Diagnosis not present

## 2017-12-02 DIAGNOSIS — I10 Essential (primary) hypertension: Secondary | ICD-10-CM | POA: Diagnosis not present

## 2017-12-02 DIAGNOSIS — H04123 Dry eye syndrome of bilateral lacrimal glands: Secondary | ICD-10-CM | POA: Diagnosis not present

## 2017-12-02 DIAGNOSIS — H2513 Age-related nuclear cataract, bilateral: Secondary | ICD-10-CM | POA: Diagnosis not present

## 2018-01-11 DIAGNOSIS — H2513 Age-related nuclear cataract, bilateral: Secondary | ICD-10-CM | POA: Diagnosis not present

## 2018-02-01 ENCOUNTER — Ambulatory Visit (INDEPENDENT_AMBULATORY_CARE_PROVIDER_SITE_OTHER): Payer: Medicare Other | Admitting: Vascular Surgery

## 2018-02-01 ENCOUNTER — Encounter (INDEPENDENT_AMBULATORY_CARE_PROVIDER_SITE_OTHER): Payer: Medicare Other

## 2018-02-09 ENCOUNTER — Ambulatory Visit: Payer: Self-pay | Admitting: Family Medicine

## 2018-02-11 ENCOUNTER — Encounter: Payer: Self-pay | Admitting: Family Medicine

## 2018-02-11 ENCOUNTER — Ambulatory Visit (INDEPENDENT_AMBULATORY_CARE_PROVIDER_SITE_OTHER): Payer: Medicare Other | Admitting: Family Medicine

## 2018-02-11 VITALS — BP 112/58 | HR 76 | Temp 98.4°F | Resp 16 | Wt 90.0 lb

## 2018-02-11 DIAGNOSIS — M25512 Pain in left shoulder: Secondary | ICD-10-CM | POA: Diagnosis not present

## 2018-02-11 DIAGNOSIS — F0390 Unspecified dementia without behavioral disturbance: Secondary | ICD-10-CM

## 2018-02-11 DIAGNOSIS — I150 Renovascular hypertension: Secondary | ICD-10-CM | POA: Diagnosis not present

## 2018-02-11 NOTE — Progress Notes (Signed)
Patient: Sarah [name] Female    DOB: 19-Aug-1941   77 y.o.   MRN: [id number] Visit Date: 02/11/2018  Today's Provider: Wilhemena Durie, MD   Chief Complaint  Patient presents with  . Hypertension  . Dementia   Subjective:    HPI Pt is here for a follow up on her chronic problems. She reports that she is feeling well. Son reports that memory is stable. He would like to get a home health nurse to come in a help with house hold things and was told medicare would pay for it but needs an order for it. Her left shoulder bothers her some but other than that she feels well.      Allergies  Allergen Reactions  . Penicillins Hives     Current Outpatient Medications:  .  acetaminophen (TYLENOL) 500 MG tablet, Take 500 mg by mouth every 6 (six) hours as needed., Disp: , Rfl:  .  alendronate (FOSAMAX) 70 MG tablet, Take 1 tablet (70 mg total) by mouth once a week. Take every Friday. Take with a full glass of water on an empty stomach., Disp: 12 tablet, Rfl: 3 .  amLODipine (NORVASC) 10 MG tablet, TAKE ONE TABLET BY MOUTH ONCE DAILY, Disp: 90 tablet, Rfl: 3 .  aspirin EC 81 MG tablet, Take 81 mg by mouth daily., Disp: , Rfl:  .  Cholecalciferol (VITAMIN D3) 1000 UNITS CAPS, Take by mouth., Disp: , Rfl:  .  cyanocobalamin 100 MCG tablet, Take by mouth., Disp: , Rfl:  .  donepezil (ARICEPT) 5 MG tablet, Take 1 tablet (5 mg total) by mouth at bedtime., Disp: 90 tablet, Rfl: 3 .  lisinopril (PRINIVIL,ZESTRIL) 40 MG tablet, TAKE ONE TABLET BY MOUTH ONCE DAILY, Disp: 90 tablet, Rfl: 3 .  Vitamin E 100 UNITS TABS, Take by mouth., Disp: , Rfl:  .  CALCIUM CARBONATE-VITAMIN D PO, Take by mouth., Disp: , Rfl:  .  lovastatin (MEVACOR) 40 MG tablet, Take 1 tablet (40 mg total) by mouth at bedtime. (Patient not taking: Reported on 08/19/2017), Disp: 90 tablet, Rfl: 1 .  mirtazapine (REMERON) 30 MG tablet, Take 1 tablet (30 mg total) by mouth at bedtime. (Patient not taking: Reported on  08/19/2017), Disp: 90 tablet, Rfl: 1 .  pantoprazole (PROTONIX) 40 MG tablet, Take 1 tablet (40 mg total) by mouth daily. (Patient not taking: Reported on 08/19/2017), Disp: 30 tablet, Rfl: 3 .  Psyllium (METAMUCIL) 28.3 % POWD, Mix with 8 ounces of water as directed daily (Patient not taking: Reported on 02/11/2018), Disp: , Rfl:  .  SODIUM CHLORIDE, EXTERNAL, ([facility] SALINE WOUND [place]) 0.9 % SOLN, Apply 1 application topically 2 (two) times daily. (Patient not taking: Reported on 02/11/2018), Disp: 210 mL, Rfl: 0  Review of Systems  Constitutional: Negative.   HENT: Negative.   Eyes: Negative.   Respiratory: Negative.   Cardiovascular: Negative.   Gastrointestinal: Negative.   Endocrine: Negative.   Genitourinary: Negative.   Musculoskeletal: Positive for arthralgias.  Skin: Negative.   Allergic/Immunologic: Negative.   Neurological: Negative.   Hematological: Negative.   Psychiatric/Behavioral: Negative.     Social History   Tobacco Use  . Smoking status: Current Every Day Smoker    Packs/day: 0.50    Years: 40.00    Pack years: 20.00    Types: Cigarettes  . Smokeless tobacco: Never Used  Substance Use Topics  . Alcohol use: No    Alcohol/week: 0.0 oz   Objective:  BP (!) 112/58 (BP Location: Left Arm, Patient Position: Sitting, Cuff Size: Small)   Pulse 76   Temp 98.4 F (36.9 C) (Oral)   Resp 16   Wt 90 lb (40.8 kg)   SpO2 98%   BMI 18.18 kg/m  Vitals:   02/11/18 1118  BP: (!) 112/58  Pulse: 76  Resp: 16  Temp: 98.4 F (36.9 C)  TempSrc: Oral  SpO2: 98%  Weight: 90 lb (40.8 kg)     Physical Exam  Constitutional: She is oriented to person, place, and time. She appears well-developed and well-nourished.  HENT:  Head: Normocephalic and atraumatic.  Eyes: No scleral icterus.  Neck: No thyromegaly present.  Cardiovascular: Normal rate, regular rhythm and normal heart sounds.  Pulmonary/Chest: Effort normal and breath sounds normal.  Abdominal: Soft.    Musculoskeletal: She exhibits no edema.  Neurological: She is alert and oriented to person, place, and time.  Skin: Skin is warm and dry.  Psychiatric: She has a normal mood and affect. Her behavior is normal. Judgment and thought content normal.        Assessment & Plan:     1. Hypertension, renovascular   2. Dementia without behavioral disturbance, unspecified dementia type Progressive. Pt not driving but family advised to take car for safety issues.More thant 50% of 25 minute visit spent in counseling. - Ambulatory referral to [facility]  3. Left shoulder pain, unspecified chronicity  - DG Shoulder Left; Future 4.HLD Off Lovastatin. 5.Tobacco abuse  I have done the exam and reviewed the chart and it is accurate to the best of my knowledge. Development worker, community has been used and  any errors in dictation or transcription are unintentional. Miguel Aschoff M.D. [place], MD  [facility]

## 2018-02-17 ENCOUNTER — Other Ambulatory Visit: Payer: Self-pay | Admitting: Family Medicine

## 2018-02-17 DIAGNOSIS — R413 Other amnesia: Secondary | ICD-10-CM

## 2018-02-25 DIAGNOSIS — H2511 Age-related nuclear cataract, right eye: Secondary | ICD-10-CM | POA: Diagnosis not present

## 2018-03-04 ENCOUNTER — Encounter: Payer: Self-pay | Admitting: *Deleted

## 2018-03-09 ENCOUNTER — Encounter
Admission: RE | Disposition: A | Discharge status: Home / Self Care | Payer: Self-pay | Source: Ambulatory Visit | Attending: Ophthalmology

## 2018-03-09 ENCOUNTER — Ambulatory Visit: Payer: Medicare Other | Admitting: Anesthesiology

## 2018-03-09 ENCOUNTER — Encounter: Payer: Self-pay | Admitting: Ophthalmology

## 2018-03-09 ENCOUNTER — Ambulatory Visit
Admission: RE | Admit: 2018-03-09 | Discharge: 2018-03-09 | Disposition: A | Discharge status: Home / Self Care | Payer: Medicare Other | Source: Ambulatory Visit | Attending: Ophthalmology | Admitting: Ophthalmology

## 2018-03-09 DIAGNOSIS — K219 Gastro-esophageal reflux disease without esophagitis: Secondary | ICD-10-CM | POA: Diagnosis not present

## 2018-03-09 DIAGNOSIS — F0280 Dementia in other diseases classified elsewhere without behavioral disturbance: Secondary | ICD-10-CM | POA: Diagnosis not present

## 2018-03-09 DIAGNOSIS — G309 Alzheimer's disease, unspecified: Secondary | ICD-10-CM | POA: Insufficient documentation

## 2018-03-09 DIAGNOSIS — F17210 Nicotine dependence, cigarettes, uncomplicated: Secondary | ICD-10-CM | POA: Diagnosis not present

## 2018-03-09 DIAGNOSIS — H2511 Age-related nuclear cataract, right eye: Secondary | ICD-10-CM | POA: Diagnosis not present

## 2018-03-09 DIAGNOSIS — I10 Essential (primary) hypertension: Secondary | ICD-10-CM | POA: Insufficient documentation

## 2018-03-09 DIAGNOSIS — I739 Peripheral vascular disease, unspecified: Secondary | ICD-10-CM | POA: Diagnosis not present

## 2018-03-09 HISTORY — DX: Unspecified osteoarthritis, unspecified site: M19.90

## 2018-03-09 HISTORY — PX: CATARACT EXTRACTION W/PHACO: SHX586

## 2018-03-09 HISTORY — DX: Congenital malformation of ear, unspecified: Q17.9

## 2018-03-09 HISTORY — DX: Unspecified dementia, unspecified severity, without behavioral disturbance, psychotic disturbance, mood disturbance, and anxiety: F03.90

## 2018-03-09 SURGERY — PHACOEMULSIFICATION, CATARACT, WITH IOL INSERTION
Anesthesia: Monitor Anesthesia Care | Site: Eye | Laterality: Right | Wound class: Clean

## 2018-03-09 MED ORDER — MOXIFLOXACIN HCL 0.5 % OP SOLN
OPHTHALMIC | Status: DC | PRN
  Administered 2018-03-09: 0.2 mL via OPHTHALMIC

## 2018-03-09 MED ORDER — POVIDONE-IODINE 5 % OP SOLN
OPHTHALMIC | Status: AC
  Filled 2018-03-09: qty 30

## 2018-03-09 MED ORDER — NA CHONDROIT SULF-NA HYALURON 40-17 MG/ML IO SOLN
INTRAOCULAR | Status: DC | PRN
  Administered 2018-03-09: 1 mL via INTRAOCULAR

## 2018-03-09 MED ORDER — EPINEPHRINE PF 1 MG/ML IJ SOLN
INTRAOCULAR | Status: DC | PRN
  Administered 2018-03-09: 09:00:00 via OPHTHALMIC

## 2018-03-09 MED ORDER — CARBACHOL 0.01 % IO SOLN
INTRAOCULAR | Status: DC | PRN
  Administered 2018-03-09: 0.5 mL via INTRAOCULAR

## 2018-03-09 MED ORDER — EPINEPHRINE PF 1 MG/ML IJ SOLN
INTRAMUSCULAR | Status: AC
  Filled 2018-03-09: qty 2

## 2018-03-09 MED ORDER — NA CHONDROIT SULF-NA HYALURON 40-17 MG/ML IO SOLN
INTRAOCULAR | Status: AC
  Filled 2018-03-09: qty 1

## 2018-03-09 MED ORDER — MOXIFLOXACIN HCL 0.5 % OP SOLN: 1.0000 [drp] | OPHTHALMIC | Status: DC | PRN

## 2018-03-09 MED ORDER — POVIDONE-IODINE 5 % OP SOLN
OPHTHALMIC | Status: DC | PRN
  Administered 2018-03-09: 1 via OPHTHALMIC

## 2018-03-09 MED ORDER — BSS IO SOLN
INTRAOCULAR | Status: DC | PRN
  Administered 2018-03-09: 4 mL via OPHTHALMIC

## 2018-03-09 MED ORDER — LIDOCAINE HCL (PF) 4 % IJ SOLN
INTRAMUSCULAR | Status: AC
  Filled 2018-03-09: qty 5

## 2018-03-09 MED ORDER — ARMC OPHTHALMIC DILATING DROPS
1.0000 "application " | OPHTHALMIC | Status: AC
  Administered 2018-03-09 (×3): 1 via OPHTHALMIC

## 2018-03-09 MED ORDER — GLYCOPYRROLATE 0.2 MG/ML IJ SOLN
INTRAMUSCULAR | Status: DC | PRN
  Administered 2018-03-09: 0.2 mg via INTRAVENOUS

## 2018-03-09 MED ORDER — SODIUM CHLORIDE 0.9 % IV SOLN
INTRAVENOUS | Status: DC
  Administered 2018-03-09: 08:00:00 via INTRAVENOUS

## 2018-03-09 MED ORDER — MIDAZOLAM HCL 2 MG/2ML IJ SOLN
INTRAMUSCULAR | Status: AC
  Filled 2018-03-09: qty 2

## 2018-03-09 MED ORDER — MIDAZOLAM HCL 2 MG/2ML IJ SOLN
INTRAMUSCULAR | Status: DC | PRN
  Administered 2018-03-09: 0.5 mg via INTRAVENOUS

## 2018-03-09 SURGICAL SUPPLY — 16 items

## 2018-03-09 NOTE — Anesthesia Preprocedure Evaluation (Signed)
Anesthesia Evaluation  Patient identified by MRN, date of birth, ID band Patient awake    Reviewed: Allergy & Precautions, NPO status , Patient's Chart, lab work & pertinent test results  History of Anesthesia Complications Negative for: history of anesthetic complications  Airway Mallampati: II       Dental  (+) Upper Dentures, Lower Dentures   Pulmonary neg sleep apnea, neg COPD, Current Smoker,           Cardiovascular hypertension, Pt. on medications + Peripheral Vascular Disease  (-) Past MI and (-) CHF (-) dysrhythmias (-) Valvular Problems/Murmurs     Neuro/Psych neg Seizures Anxiety Depression Dementia    GI/Hepatic Neg liver ROS, GERD  Medicated,  Endo/Other  neg diabetes  Renal/GU Renal disease (s/p renal artery stent)     Musculoskeletal   Abdominal   Peds  Hematology   Anesthesia Other Findings   Reproductive/Obstetrics                             Anesthesia Physical Anesthesia Plan  ASA: III  Anesthesia Plan: MAC   Post-op Pain Management:    Induction:   PONV Risk Score and Plan:   Airway Management Planned: Nasal Cannula  Additional Equipment:   Intra-op Plan:   Post-operative Plan:   Informed Consent: I have reviewed the patients History and Physical, chart, labs and discussed the procedure including the risks, benefits and alternatives for the proposed anesthesia with the patient or authorized representative who has indicated his/her understanding and acceptance.     Plan Discussed with:   Anesthesia Plan Comments:         Anesthesia Quick Evaluation

## 2018-03-09 NOTE — Discharge Instructions (Signed)
Eye Surgery Discharge Instructions  Expect mild scratchy sensation or mild soreness. DO NOT RUB YOUR EYE!  The day of surgery:  Minimal physical activity, but bed rest is not required  No reading, computer work, or close hand work  No bending, lifting, or straining.  May watch TV  For 24 hours:  No driving, legal decisions, or alcoholic beverages  Safety precautions  Eat anything you prefer: It is better to start with liquids, then soup then solid foods.  _____ Eye patch should be worn until postoperative exam tomorrow.  ____ Solar shield eyeglasses should be worn for comfort in the sunlight/patch while sleeping  Resume all regular medications including aspirin or Coumadin if these were discontinued prior to surgery. You may shower, bathe, shave, or wash your hair. Tylenol may be taken for mild discomfort.  Call your doctor if you experience significant pain, nausea, or vomiting, fever > 101 or other signs of infection. [phone] or [phone] Specific instructions:  Follow-up Information    Birder Robson, MD Follow up.   Specialty:  Ophthalmology Why:  03/10/18 at 1:15 Contact information: [address] [place] [address] [phone]          Eye Surgery Discharge Instructions  Expect mild scratchy sensation or mild soreness. DO NOT RUB YOUR EYE!  The day of surgery:  Minimal physical activity, but bed rest is not required  No reading, computer work, or close hand work  No bending, lifting, or straining.  May watch TV  For 24 hours:  No driving, legal decisions, or alcoholic beverages  Safety precautions  Eat anything you prefer: It is better to start with liquids, then soup then solid foods.  _____ Eye patch should be worn until postoperative exam tomorrow.  ____ Solar shield eyeglasses should be worn for comfort in the sunlight/patch while sleeping  Resume all regular medications including aspirin or Coumadin if these were  discontinued prior to surgery. You may shower, bathe, shave, or wash your hair. Tylenol may be taken for mild discomfort.  Call your doctor if you experience significant pain, nausea, or vomiting, fever > 101 or other signs of infection. [phone] or [phone] Specific instructions:  Follow-up Information    Birder Robson, MD Follow up.   Specialty:  Ophthalmology Why:  03/10/18 at 1:15 Contact information: [address] [place] [address] [phone]

## 2018-03-09 NOTE — Anesthesia Post-op Follow-up Note (Signed)
Anesthesia QCDR form completed.        

## 2018-03-09 NOTE — Op Note (Signed)
PREOPERATIVE DIAGNOSIS:  Nuclear sclerotic cataract of the right eye.   POSTOPERATIVE DIAGNOSIS:  NUCLEAR SCLEROTIC CATARACT RIGHT EYE   OPERATIVE PROCEDURE: Procedure(s): CATARACT EXTRACTION PHACO AND INTRAOCULAR LENS PLACEMENT (IOC)   SURGEON:  Birder Robson, MD.   ANESTHESIA:  Anesthesiologist: Gunnar Fusi, MD CRNA: Carron Curie, CRNA  1.      Managed anesthesia care. 2.      0.49ml of Shugarcaine was instilled in the eye following the paracentesis.   COMPLICATIONS:  None.   TECHNIQUE:   Stop and chop   DESCRIPTION OF PROCEDURE:  The patient was examined and consented in the preoperative holding area where the aforementioned topical anesthesia was applied to the right eye and then brought back to the Operating Room where the right eye was prepped and draped in the usual sterile ophthalmic fashion and a lid speculum was placed. A paracentesis was created with the side port blade and the anterior chamber was filled with viscoelastic. A near clear corneal incision was performed with the steel keratome. A continuous curvilinear capsulorrhexis was performed with a cystotome followed by the capsulorrhexis forceps. Hydrodissection and hydrodelineation were carried out with BSS on a blunt cannula. The lens was removed in a stop and chop  technique and the remaining cortical material was removed with the irrigation-aspiration handpiece. The capsular bag was inflated with viscoelastic and the Technis [id number]  lens was placed in the capsular bag without complication. The remaining viscoelastic was removed from the eye with the irrigation-aspiration handpiece. The wounds were hydrated. The anterior chamber was flushed with Miostat and the eye was inflated to physiologic pressure. 0.51ml of Vigamox was placed in the anterior chamber. The wounds were found to be water tight. The eye was dressed with Vigamox. The patient was given protective glasses to wear throughout the day and a shield with  which to sleep tonight. The patient was also given drops with which to begin a drop regimen today and will follow-up with me in one day. Implant Name Type Inv. Item Serial No. Manufacturer Lot No. LRB No. Used  LENS IOL DIOP 21.5 - [id number] Intraocular Lens LENS IOL DIOP 21.5 [id number] AMO  Right 1   Procedure(s) with comments: CATARACT EXTRACTION PHACO AND INTRAOCULAR LENS PLACEMENT (IOC) (Right) - [place] 00:38.9 AP% 15.9 CDE 6.19 FLUID PACK LOT # [id number]  Electronically signed: Birder Robson 03/09/2018 9:33 AM

## 2018-03-09 NOTE — Transfer of Care (Signed)
Immediate Anesthesia Transfer of Care Note  Patient: Sarah [name]  Procedure(s) Performed: CATARACT EXTRACTION PHACO AND INTRAOCULAR LENS PLACEMENT (IOC) (Right Eye)  Patient Location: PACU  Anesthesia Type:MAC  Level of Consciousness: awake  Airway & Oxygen Therapy: Patient Spontanous Breathing  Post-op Assessment: Report given to RN  Post vital signs: stable  Last Vitals:  Vitals Value Taken Time  BP    Temp    Pulse    Resp    SpO2      Last Pain:  Vitals:   03/09/18 0749  TempSrc: Temporal  PainSc: 0-No pain         Complications: No apparent anesthesia complications

## 2018-03-09 NOTE — Anesthesia Postprocedure Evaluation (Signed)
Anesthesia Post Note  Patient: Sarah [name]  Procedure(s) Performed: CATARACT EXTRACTION PHACO AND INTRAOCULAR LENS PLACEMENT ([place]) (Right Eye)  Patient location during evaluation: PACU Anesthesia Type: MAC Pain management: pain level controlled Respiratory status: spontaneous breathing Cardiovascular status: blood pressure returned to baseline Postop Assessment: no apparent nausea or vomiting Anesthetic complications: no     Last Vitals:  Vitals:   03/09/18 0749  BP: (!) 132/58  Pulse: (!) 54  Resp: 18  Temp: 36.4 C  SpO2: 100%    Last Pain:  Vitals:   03/09/18 0749  TempSrc: Temporal  PainSc: 0-No pain                 Carron Curie

## 2018-03-09 NOTE — H&P (Signed)
All labs reviewed. Abnormal studies sent to patients PCP when indicated.  Previous H&P reviewed, patient examined, there are NO CHANGES.  Sarah Tosi Porfilio5/14/20199:08 AM

## 2018-03-16 ENCOUNTER — Other Ambulatory Visit: Payer: Self-pay | Admitting: Family Medicine

## 2018-03-16 DIAGNOSIS — I10 Essential (primary) hypertension: Secondary | ICD-10-CM

## 2018-03-18 ENCOUNTER — Ambulatory Visit (INDEPENDENT_AMBULATORY_CARE_PROVIDER_SITE_OTHER): Payer: Medicare Other | Admitting: Vascular Surgery

## 2018-03-18 ENCOUNTER — Encounter (INDEPENDENT_AMBULATORY_CARE_PROVIDER_SITE_OTHER): Payer: Self-pay | Admitting: Vascular Surgery

## 2018-03-18 ENCOUNTER — Ambulatory Visit (INDEPENDENT_AMBULATORY_CARE_PROVIDER_SITE_OTHER): Payer: Medicare Other

## 2018-03-18 VITALS — BP 136/66 | HR 55 | Resp 16 | Ht 59.0 in | Wt 90.2 lb

## 2018-03-18 DIAGNOSIS — I150 Renovascular hypertension: Secondary | ICD-10-CM | POA: Diagnosis not present

## 2018-03-18 DIAGNOSIS — K219 Gastro-esophageal reflux disease without esophagitis: Secondary | ICD-10-CM

## 2018-03-18 DIAGNOSIS — I701 Atherosclerosis of renal artery: Secondary | ICD-10-CM | POA: Diagnosis not present

## 2018-03-18 DIAGNOSIS — E785 Hyperlipidemia, unspecified: Secondary | ICD-10-CM

## 2018-03-18 NOTE — Progress Notes (Signed)
MRN : [id number]  Sarah [name] is a 77 y.o. ([birth year]) female who presents with chief complaint of  Chief Complaint  Patient presents with  . Follow-up    1 yr Renal   .  History of Present Illness:  The patient returns to the office for followup and review of the noninvasive studies. She reports her BP has been stable and well controlled.  Procedure performed 11/21/2014: 1.  Abdominal aortogram.  2.  Right selective renal artery angiography.  3.  Percutaneous transluminal angioplasty and stent placement, right renal artery using a 4 x 15 Herculink stent post dilated to 5 mm.   There have been no interval changes in lower extremity symptoms. No interval shortening of the patient's claudication distance or development of rest pain symptoms. No new ulcers or wounds have occurred since the last visit.  There have been no significant changes to the patient's overall health care.  The patient denies amaurosis fugax or recent TIA symptoms. There are no recent neurological changes noted. The patient denies history of DVT, PE or superficial thrombophlebitis. The patient denies recent episodes of angina or shortness of breath.   Duplex ultrasound of the renal arteries shows the right stent is patent with a patent left renal artery.   Current Meds  Medication Sig  . acetaminophen (TYLENOL) 500 MG tablet Take 500 mg by mouth every 6 (six) hours as needed.   Marland Kitchen alendronate (FOSAMAX) 70 MG tablet Take 1 tablet (70 mg total) by mouth once a week. Take every Friday. Take with a full glass of water on an empty stomach.  Marland Kitchen amLODipine (NORVASC) 10 MG tablet TAKE 1 TABLET BY MOUTH ONCE DAILY  . aspirin EC 81 MG tablet Take 81 mg by mouth daily.  Marland Kitchen CALCIUM CARBONATE-VITAMIN D PO Take 1 tablet by mouth daily.   . cholecalciferol (VITAMIN D) 1000 units tablet Take 1,000 Units by mouth daily.   . cyanocobalamin 1000 MCG tablet Take 1,000 mcg by mouth daily.   Marland Kitchen donepezil (ARICEPT) 5 MG  tablet TAKE ONE TABLET BY MOUTH AT BEDTIME  . lisinopril (PRINIVIL,ZESTRIL) 40 MG tablet TAKE 1 TABLET BY MOUTH ONCE DAILY  . vitamin E 400 UNIT capsule Take 1 capsule by mouth daily.     Past Medical History:  Diagnosis Date  . Arthritis   . Dementia    ALZHEIMERS  . Ear anomaly    INNER EAR ISSUES  . Hyperlipidemia   . Hypertension     Past Surgical History:  Procedure Laterality Date  . APPENDECTOMY    . BREAST BIOPSY    . CATARACT EXTRACTION W/PHACO Right 03/09/2018   Procedure: CATARACT EXTRACTION PHACO AND INTRAOCULAR LENS PLACEMENT (IOC);  Surgeon: Birder Robson, MD;  Location: [facility] ORS;  Service: Ophthalmology;  Laterality: Right;  [place] 00:38.9 AP% 15.9 CDE 6.19 FLUID PACK LOT # [id number] H  . CESAREAN SECTION    . CHOLECYSTECTOMY    . CYSTOSCOPY WITH STENT PLACEMENT      Social History Social History   Tobacco Use  . Smoking status: Current Every Day Smoker    Packs/day: 0.50    Years: 40.00    Pack years: 20.00    Types: Cigarettes  . Smokeless tobacco: Never Used  Substance Use Topics  . Alcohol use: No    Alcohol/week: 0.0 oz  . Drug use: No    Family History Family History  Problem Relation Age of Onset  . Colon cancer Father   . Pancreatic cancer Sister   .  GER disease Son   . Alzheimer's disease Sister     Allergies  Allergen Reactions  . Penicillins Hives and Other (See Comments)    Has patient had a PCN reaction causing immediate rash, facial/tongue/throat swelling, SOB or lightheadedness with hypotension: No Has patient had a PCN reaction causing severe rash involving mucus membranes or skin necrosis: No Has patient had a PCN reaction that required hospitalization: No Has patient had a PCN reaction occurring within the last 10 years: No  If all of the above answers are "NO", then may proceed with Cephalosporin use.      REVIEW OF SYSTEMS (Negative unless checked)  Constitutional: [] Weight loss  [] Fever  [] Chills Cardiac: [] Chest  pain   [] Chest pressure   [] Palpitations   [] Shortness of breath when laying flat   [] Shortness of breath with exertion. Vascular:  [] Pain in legs with walking   [] Pain in legs at rest  [] History of DVT   [] Phlebitis   [x] Swelling in legs   [] Varicose veins   [] Non-healing ulcers Pulmonary:   [] Uses home oxygen   [] Productive cough   [] Hemoptysis   [] Wheeze  [] COPD   [] Asthma Neurologic:  [] Dizziness   [] Seizures   [] History of stroke   [] History of TIA  [] Aphasia   [] Vissual changes   [] Weakness or numbness in arm   [] Weakness or numbness in leg Musculoskeletal:   [] Joint swelling   [] Joint pain   [] Low back pain Hematologic:  [] Easy bruising  [] Easy bleeding   [] Hypercoagulable state   [] Anemic Gastrointestinal:  [] Diarrhea   [] Vomiting  [] Gastroesophageal reflux/heartburn   [] Difficulty swallowing. Genitourinary:  [] Chronic kidney disease   [] Difficult urination  [] Frequent urination   [] Blood in urine Skin:  [] Rashes   [] Ulcers  Psychological:  [x] History of anxiety   []  History of major depression.  Physical Examination  Vitals:   03/18/18 0946  BP: 136/66  Pulse: (!) 55  Resp: 16  Weight: 90 lb 3.2 oz (40.9 kg)  Height: 4\' 11"  (1.499 m)   Body mass index is 18.22 kg/m. Gen: WD/WN, NAD Head: [place]/AT, No temporalis wasting.  Ear/Nose/Throat: Hearing grossly intact, nares w/o erythema or drainage Eyes: PER, EOMI, sclera nonicteric.  Neck: Supple, no large masses.   Pulmonary:  Good air movement, no audible wheezing bilaterally, no use of accessory muscles.  Cardiac: RRR, no JVD Vascular:  Vessel Right Left  Radial Palpable Palpable  PT Palpable Palpable  DP Palpable Palpable  Gastrointestinal: Non-distended. No guarding/no peritoneal signs.  Musculoskeletal: M/S 5/5 throughout.  No deformity or atrophy.  Neurologic: CN 2-12 intact. Symmetrical.  Speech is fluent. Motor exam as listed above. Psychiatric: Judgment intact, Mood & affect appropriate for pt's clinical  situation. Dermatologic: No rashes or ulcers noted.  No changes consistent with cellulitis. Lymph : No lichenification or skin changes of chronic lymphedema.  CBC Lab Results  Component Value Date   WBC 10.4 11/25/2016   HGB 11.5 11/25/2016   HCT 36.4 11/25/2016   MCV 94 11/25/2016   PLT 244 11/25/2016    BMET    Component Value Date/Time   NA 140 05/15/2017   NA 141 11/21/2014 1234   K 4.5 05/15/2017   K 3.9 11/21/2014 1234   CL 109 (H) 11/25/2016 1046   CL 110 (H) 11/21/2014 1234   CO2 18 11/25/2016 1046   CO2 24 11/21/2014 1234   GLUCOSE 97 11/25/2016 1046   GLUCOSE 91 11/21/2014 1234   BUN 39 (A) 05/15/2017   BUN 29 (H)  11/21/2014 1234   CREATININE 1.6 (A) 05/15/2017   CREATININE 1.86 (H) 11/25/2016 1046   CREATININE 1.48 (H) 11/21/2014 1234   CALCIUM 9.2 11/25/2016 1046   CALCIUM 8.9 11/21/2014 1234   GFRNONAA 26 (L) 11/25/2016 1046   GFRNONAA 37 (L) 11/21/2014 1234   GFRAA 30 (L) 11/25/2016 1046   GFRAA 44 (L) 11/21/2014 1234   CrCl cannot be calculated (Patient's most recent lab result is older than the maximum 21 days allowed.).  COAG No results found for: INR, PROTIME  Radiology No results found.    Assessment/Plan 1. Renal artery stenosis (HCC)  Recommend:  The patient has evidence of atherosclerosis of the renal arteries.  The patient does not voice lifestyle limiting changes at this point in time.  Noninvasive studies do not suggest clinically significant change.  No invasive studies, angiography or surgery at this time The patient should continue walking and begin a more formal exercise program.  The patient should continue antiplatelet therapy and aggressive treatment of the lipid abnormalities  No changes in the patient's medications at this time  - VAS US RENAL ARTERY DUPLEX; Future  2. Hypertension, renovascular Continue antihypertensive medications as already ordered, these medications have been reviewed and there are no changes at  this time.   3. Hyperlipidemia, unspecified hyperlipidemia type Continue statin as ordered and reviewed, no changes at this time   4. Gastroesophageal reflux disease, esophagitis presence not specified Continue antihypertensive medications as already ordered, these medications have been reviewed and there are no changes at this time.  Avoidence of caffeine and alcohol  Moderate elevation of the head of the bed    Hortencia Pilar, MD  03/18/2018 9:58 AM

## 2018-03-22 ENCOUNTER — Encounter (INDEPENDENT_AMBULATORY_CARE_PROVIDER_SITE_OTHER): Payer: Self-pay | Admitting: Vascular Surgery

## 2018-03-25 ENCOUNTER — Ambulatory Visit (INDEPENDENT_AMBULATORY_CARE_PROVIDER_SITE_OTHER): Payer: Medicare Other | Admitting: Vascular Surgery

## 2018-03-25 ENCOUNTER — Encounter (INDEPENDENT_AMBULATORY_CARE_PROVIDER_SITE_OTHER): Payer: Medicare Other

## 2018-04-26 ENCOUNTER — Telehealth: Payer: Self-pay | Admitting: Family Medicine

## 2018-04-26 NOTE — Telephone Encounter (Signed)
I called pt to schedule AWV.  She said that she has to wait for her grandson to bring her and he's out of town. VDM (DD)

## 2018-05-11 DIAGNOSIS — N183 Chronic kidney disease, stage 3 (moderate): Secondary | ICD-10-CM | POA: Diagnosis not present

## 2018-05-11 DIAGNOSIS — I701 Atherosclerosis of renal artery: Secondary | ICD-10-CM | POA: Diagnosis not present

## 2018-05-11 DIAGNOSIS — I129 Hypertensive chronic kidney disease with stage 1 through stage 4 chronic kidney disease, or unspecified chronic kidney disease: Secondary | ICD-10-CM | POA: Diagnosis not present

## 2018-05-11 LAB — BASIC METABOLIC PANEL
BUN: 22 — AB (ref 4–21)
Creatinine: 0.9 (ref 0.5–1.1)
Glucose: 116
Potassium: 4.6 (ref 3.4–5.3)
Sodium: 134 — AB (ref 137–147)

## 2018-05-20 NOTE — Progress Notes (Signed)
Abstracted per [facility].

## 2018-05-31 ENCOUNTER — Ambulatory Visit: Payer: Medicare Other

## 2018-06-01 NOTE — Telephone Encounter (Signed)
Pt is scheduled 06/18/18 @ 1040 AM for AWV. -MM

## 2018-06-11 DIAGNOSIS — H2512 Age-related nuclear cataract, left eye: Secondary | ICD-10-CM | POA: Diagnosis not present

## 2018-06-14 ENCOUNTER — Encounter: Payer: Self-pay | Admitting: *Deleted

## 2018-06-14 ENCOUNTER — Ambulatory Visit (INDEPENDENT_AMBULATORY_CARE_PROVIDER_SITE_OTHER): Payer: Medicare Other | Admitting: Family Medicine

## 2018-06-14 VITALS — BP 138/74 | HR 50 | Temp 97.7°F | Resp 18 | Wt 88.0 lb

## 2018-06-14 DIAGNOSIS — F0280 Dementia in other diseases classified elsewhere without behavioral disturbance: Secondary | ICD-10-CM

## 2018-06-14 DIAGNOSIS — G301 Alzheimer's disease with late onset: Secondary | ICD-10-CM

## 2018-06-14 DIAGNOSIS — M25512 Pain in left shoulder: Secondary | ICD-10-CM

## 2018-06-14 DIAGNOSIS — E46 Unspecified protein-calorie malnutrition: Secondary | ICD-10-CM | POA: Diagnosis not present

## 2018-06-14 DIAGNOSIS — K219 Gastro-esophageal reflux disease without esophagitis: Secondary | ICD-10-CM

## 2018-06-14 DIAGNOSIS — I150 Renovascular hypertension: Secondary | ICD-10-CM

## 2018-06-14 MED ORDER — PANTOPRAZOLE SODIUM 40 MG PO TBEC: 40.0000 mg | DELAYED_RELEASE_TABLET | Freq: Every day | ORAL | 3 refills | Status: DC

## 2018-06-14 NOTE — Progress Notes (Signed)
Sarah [name]  MRN: [id number] DOB: 03-15-1941  Subjective:  HPI  The patient is a 77 year old female who presents for follow up of chronic health.  The patient was last seen on 02/11/18.  The patient presents today with her son who is now living with her as of 1 week ago.  Her weight has gone down 2 more pounds since hee last visit.  Her son relates that she was not eating or eating very little when no one was with her.  She is beginning to do better with that now.   She does have complaint of indigestion.  At one point she was on Pantoprazole and it is unclear why it was stopped.  The son said he was told it had a bad interaction with something else she was on, but is not certain.    The patient also has complaint today of left shoulder pain.  She has no known trauma but states she uses that arm lifting cans, picking up limbs in the yard etc. This is a chronic problem but seems to be worse recently.  Patient Active Problem List   Diagnosis Date Noted  . Renal artery stenosis ([place]) 01/26/2017  . Personal history of tobacco use, presenting hazards to health 06/23/2016  . Depression 05/29/2015  . Dementia 05/29/2015  . Allergic rhinitis 03/05/2015  . Weak pulse 03/05/2015  . Smokes tobacco daily 03/05/2015  . Anxiety, generalized 03/05/2015  . Acid reflux 03/05/2015  . HLD (hyperlipidemia) 03/05/2015  . Affective disorder, major 03/05/2015  . Bad memory 03/05/2015  . Arthritis, degenerative 03/05/2015  . OP (osteoporosis) 03/05/2015  . Hypertension, renovascular 03/05/2015  . Scoliosis 03/05/2015  . Avitaminosis D 03/05/2015  . Weight loss 03/05/2015  . Family history of colon cancer 11/07/2014    Past Medical History:  Diagnosis Date  . Arthritis   . Dementia    ALZHEIMERS  . Ear anomaly    INNER EAR ISSUES  . Hyperlipidemia   . Hypertension     Social History   Socioeconomic History  . Marital status: Widowed    Spouse name: Not on file  . Number of  children: Not on file  . Years of education: Not on file  . Highest education level: Not on file  Occupational History  . Not on file  Social Needs  . Financial resource strain: Not on file  . Food insecurity:    Worry: Not on file    Inability: Not on file  . Transportation needs:    Medical: Not on file    Non-medical: Not on file  Tobacco Use  . Smoking status: Current Every Day Smoker    Packs/day: 0.50    Years: 40.00    Pack years: 20.00    Types: Cigarettes  . Smokeless tobacco: Never Used  Substance and Sexual Activity  . Alcohol use: No    Alcohol/week: 0.0 standard drinks  . Drug use: No  . Sexual activity: Not on file  Lifestyle  . Physical activity:    Days per week: Not on file    Minutes per session: Not on file  . Stress: Not on file  Relationships  . Social connections:    Talks on phone: Not on file    Gets together: Not on file    Attends religious service: Not on file    Active member of club or organization: Not on file    Attends meetings of clubs or organizations: Not on file  Relationship status: Not on file  . Intimate partner violence:    Fear of current or ex partner: Not on file    Emotionally abused: Not on file    Physically abused: Not on file    Forced sexual activity: Not on file  Other Topics Concern  . Not on file  Social History Narrative  . Not on file    Outpatient Encounter Medications as of 06/14/2018  Medication Sig  . acetaminophen (TYLENOL) 500 MG tablet Take 500 mg by mouth every 6 (six) hours as needed.   Marland Kitchen alendronate (FOSAMAX) 70 MG tablet Take 1 tablet (70 mg total) by mouth once a week. Take every Friday. Take with a full glass of water on an empty stomach.  Marland Kitchen amLODipine (NORVASC) 10 MG tablet TAKE 1 TABLET BY MOUTH ONCE DAILY  . aspirin EC 81 MG tablet Take 81 mg by mouth daily.  Marland Kitchen CALCIUM CARBONATE-VITAMIN D PO Take 1 tablet by mouth daily.   . cholecalciferol (VITAMIN D) 1000 units tablet Take 1,000 Units by  mouth daily.   . cyanocobalamin 1000 MCG tablet Take 1,000 mcg by mouth daily.   Marland Kitchen donepezil (ARICEPT) 5 MG tablet TAKE ONE TABLET BY MOUTH AT BEDTIME  . lisinopril (PRINIVIL,ZESTRIL) 40 MG tablet TAKE 1 TABLET BY MOUTH ONCE DAILY  . vitamin E 400 UNIT capsule Take 1 capsule by mouth daily.    No facility-administered encounter medications on file as of 06/14/2018.     Allergies  Allergen Reactions  . Penicillins Hives and Other (See Comments)    Has patient had a PCN reaction causing immediate rash, facial/tongue/throat swelling, SOB or lightheadedness with hypotension: No Has patient had a PCN reaction causing severe rash involving mucus membranes or skin necrosis: No Has patient had a PCN reaction that required hospitalization: No Has patient had a PCN reaction occurring within the last 10 years: No  If all of the above answers are "NO", then may proceed with Cephalosporin use.     Review of Systems  Constitutional: Negative for fever and malaise/fatigue.  HENT: Negative.   Eyes: Negative.   Respiratory: Negative for cough, shortness of breath and wheezing.   Cardiovascular: Negative for chest pain, palpitations and leg swelling.  Gastrointestinal: Negative.   Musculoskeletal: Positive for joint pain.  Skin: Negative.   Neurological: Negative.   Endo/Heme/Allergies: Negative.   Psychiatric/Behavioral: Positive for memory loss.    Objective:  BP 138/74 (BP Location: Right Arm, Patient Position: Sitting, Cuff Size: Normal)   Pulse (!) 50   Temp 97.7 F (36.5 C) (Oral)   Resp 18   Wt 88 lb (39.9 kg)   SpO2 99%   BMI 17.77 kg/m   Physical Exam  Constitutional: She is oriented to person, place, and time and well-developed, well-nourished, and in no distress.  Cachectic FNAD  HENT:  Head: Normocephalic and atraumatic.  Right Ear: External ear normal.  Left Ear: External ear normal.  Nose: Nose normal.  Eyes: Conjunctivae are normal.  Neck: No thyromegaly present.    Cardiovascular: Normal rate, regular rhythm and normal heart sounds.  Pulmonary/Chest: Effort normal and breath sounds normal.  Abdominal: Soft.  Musculoskeletal: She exhibits no edema, tenderness or deformity.  Some discomfort with movement of shoulder but she still has mostly FROM.  Neurological: She is alert and oriented to person, place, and time. No cranial nerve deficit. She exhibits normal muscle tone. Gait normal. Coordination normal. GCS score is 15.  Skin: Skin is warm.  Psychiatric: Mood  and affect normal.    Assessment and Plan :  1. Hypertension, renovascular  - CBC with Differential/Platelet - Comprehensive metabolic panel - TSH  2. Malnutrition, unspecified type (HCC) Try Ensure. - CBC with Differential/Platelet - Comprehensive metabolic panel - TSH  3. Left shoulder pain, unspecified chronicity Refer to Dr B. - DG Shoulder Left; Future  4. Gastroesophageal reflux disease, esophagitis presence not specified Try Protonix. - pantoprazole (PROTONIX) 40 MG tablet; Take 1 tablet (40 mg total) by mouth daily. (Patient not taking: Reported on 06/18/2018)  Dispense: 30 tablet; Refill: 3 5.Alzheimers Dementia without behavioral issue Son just moved in with pt. He works second shift.RTC 2-3 months.  I have done the exam and reviewed the chart and it is accurate to the best of my knowledge. Development worker, community has been used and  any errors in dictation or transcription are unintentional. Miguel Aschoff M.D. [facility]

## 2018-06-15 LAB — CBC WITH DIFFERENTIAL/PLATELET
BASOS: 2 %
Basophils Absolute: 0.1 10*3/uL (ref 0.0–0.2)
EOS (ABSOLUTE): 0.2 10*3/uL (ref 0.0–0.4)
Eos: 2 %
Hematocrit: 31.4 % — ABNORMAL LOW (ref 34.0–46.6)
Hemoglobin: 10.2 g/dL — ABNORMAL LOW (ref 11.1–15.9)
IMMATURE GRANS (ABS): 0 10*3/uL (ref 0.0–0.1)
IMMATURE GRANULOCYTES: 0 %
Lymphocytes Absolute: 2 10*3/uL (ref 0.7–3.1)
Lymphs: 26 %
MCH: 29.8 pg (ref 26.6–33.0)
MCHC: 32.5 g/dL (ref 31.5–35.7)
MCV: 92 fL (ref 79–97)
Monocytes Absolute: 0.6 10*3/uL (ref 0.1–0.9)
Monocytes: 8 %
NEUTROS PCT: 62 %
Neutrophils Absolute: 4.8 10*3/uL (ref 1.4–7.0)
PLATELETS: 202 10*3/uL (ref 150–450)
RBC: 3.42 x10E6/uL — AB (ref 3.77–5.28)
RDW: 13.7 % (ref 12.3–15.4)
WBC: 7.8 10*3/uL (ref 3.4–10.8)

## 2018-06-15 LAB — COMPREHENSIVE METABOLIC PANEL
ALT: 8 IU/L (ref 0–32)
AST: 16 IU/L (ref 0–40)
Albumin/Globulin Ratio: 1.6 (ref 1.2–2.2)
Albumin: 3.9 g/dL (ref 3.5–4.8)
Alkaline Phosphatase: 50 IU/L (ref 39–117)
BUN/Creatinine Ratio: 20 (ref 12–28)
BUN: 36 mg/dL — AB (ref 8–27)
Bilirubin Total: <0.2 mg/dL (ref 0.0–1.2)
CALCIUM: 8.8 mg/dL (ref 8.7–10.3)
CO2: 21 mmol/L (ref 20–29)
CREATININE: 1.81 mg/dL — AB (ref 0.57–1.00)
Chloride: 102 mmol/L (ref 96–106)
GFR, EST AFRICAN AMERICAN: 30 mL/min/{1.73_m2} — AB (ref >59)
GFR, EST NON AFRICAN AMERICAN: 26 mL/min/{1.73_m2} — AB (ref >59)
GLUCOSE: 106 mg/dL — AB (ref 65–99)
Globulin, Total: 2.5 g/dL (ref 1.5–4.5)
Potassium: 4.9 mmol/L (ref 3.5–5.2)
Sodium: 137 mmol/L (ref 134–144)
TOTAL PROTEIN: 6.4 g/dL (ref 6.0–8.5)

## 2018-06-15 LAB — TSH: TSH: 0.548 u[IU]/mL (ref 0.450–4.500)

## 2018-06-18 ENCOUNTER — Ambulatory Visit (INDEPENDENT_AMBULATORY_CARE_PROVIDER_SITE_OTHER): Payer: Medicare Other

## 2018-06-18 VITALS — BP 144/58 | HR 66 | Temp 98.1°F | Ht 59.0 in | Wt 88.0 lb

## 2018-06-18 DIAGNOSIS — Z0000 Encounter for general adult medical examination without abnormal findings: Secondary | ICD-10-CM

## 2018-06-18 NOTE — Patient Instructions (Addendum)
Sarah [name] , Thank you for taking time to come for your Medicare Wellness Visit. I appreciate your ongoing commitment to your health goals. Please review the following plan we discussed and let me know if I can assist you in the future.   Screening recommendations/referrals: Colonoscopy: N/A Recommended yearly ophthalmology/optometry visit for glaucoma screening and checkup Recommended yearly dental visit for hygiene and checkup  Vaccinations: Influenza vaccine: N/A Pneumococcal vaccine: Up to date Tdap vaccine: Up to date Shingles vaccine: Pt declines today.     Advanced directives: Advance directive discussed with you today. Even though you declined this today please call our office should you change your mind and we can give you the proper paperwork for you to fill out.  Conditions/risks identified: Smoking cessation- recommend to cut back on how many cigarettes smoked a day until completely quits.   Next appointment: 09/13/18 with Dr Rosanna Randy.   Preventive Care 6 Years and Older, Female Preventive care refers to lifestyle choices and visits with your health care provider that can promote health and wellness. What does preventive care include?  A yearly physical exam. This is also called an annual well check.  Dental exams once or twice a year.  Routine eye exams. Ask your health care provider how often you should have your eyes checked.  Personal lifestyle choices, including:  Daily care of your teeth and gums.  Regular physical activity.  Eating a healthy diet.  Avoiding tobacco and drug use.  Limiting alcohol use.  Practicing safe sex.  Taking low doses of aspirin every day.  Taking vitamin and mineral supplements as recommended by your health care provider. What happens during an annual well check? The services and screenings done by your health care provider during your annual well check will depend on your age, overall health, lifestyle risk factors, and family  history of disease. Counseling  Your health care provider may ask you questions about your:  Alcohol use.  Tobacco use.  Drug use.  Emotional well-being.  Home and relationship well-being.  Sexual activity.  Eating habits.  History of falls.  Memory and ability to understand (cognition).  Work and work Statistician. Screening  You may have the following tests or measurements:  Height, weight, and BMI.  Blood pressure.  Lipid and cholesterol levels. These may be checked every 5 years, or more frequently if you are over 58 years old.  Skin check.  Lung cancer screening. You may have this screening every year starting at age 27 if you have a 30-pack-year history of smoking and currently smoke or have quit within the past 15 years.  Fecal occult blood test (FOBT) of the stool. You may have this test every year starting at age 45.  Flexible sigmoidoscopy or colonoscopy. You may have a sigmoidoscopy every 5 years or a colonoscopy every 10 years starting at age 41.  Prostate cancer screening. Recommendations will vary depending on your family history and other risks.  Hepatitis C blood test.  Hepatitis B blood test.  Sexually transmitted disease (STD) testing.  Diabetes screening. This is done by checking your blood sugar (glucose) after you have not eaten for a while (fasting). You may have this done every 1-3 years.  Abdominal aortic aneurysm (AAA) screening. You may need this if you are a current or former smoker.  Osteoporosis. You may be screened starting at age 23 if you are at high risk. Talk with your health care provider about your test results, treatment options, and if necessary, the  need for more tests. Vaccines  Your health care provider may recommend certain vaccines, such as:  Influenza vaccine. This is recommended every year.  Tetanus, diphtheria, and acellular pertussis (Tdap, Td) vaccine. You may need a Td booster every 10 years.  Zoster vaccine.  You may need this after age 9.  Pneumococcal 13-valent conjugate (PCV13) vaccine. One dose is recommended after age 21.  Pneumococcal polysaccharide (PPSV23) vaccine. One dose is recommended after age 67. Talk to your health care provider about which screenings and vaccines you need and how often you need them. This information is not intended to replace advice given to you by your health care provider. Make sure you discuss any questions you have with your health care provider. Document Released: 11/09/2015 Document Revised: 07/02/2016 Document Reviewed: 08/14/2015 Elsevier Interactive Patient Education  2017 [place] Prevention in the Home Falls can cause injuries. They can happen to people of all ages. There are many things you can do to make your home safe and to help prevent falls. What can I do on the outside of my home?  Regularly fix the edges of walkways and driveways and fix any cracks.  Remove anything that might make you trip as you walk through a door, such as a raised step or threshold.  Trim any bushes or trees on the path to your home.  Use bright outdoor lighting.  Clear any walking paths of anything that might make someone trip, such as rocks or tools.  Regularly check to see if handrails are loose or broken. Make sure that both sides of any steps have handrails.  Any raised decks and porches should have guardrails on the edges.  Have any leaves, snow, or ice cleared regularly.  Use sand or salt on walking paths during winter.  Clean up any spills in your garage right away. This includes oil or grease spills. What can I do in the bathroom?  Use night lights.  Install grab bars by the toilet and in the tub and shower. Do not use towel bars as grab bars.  Use non-skid mats or decals in the tub or shower.  If you need to sit down in the shower, use a plastic, non-slip stool.  Keep the floor dry. Clean up any water that spills on the floor as soon  as it happens.  Remove soap buildup in the tub or shower regularly.  Attach bath mats securely with double-sided non-slip rug tape.  Do not have throw rugs and other things on the floor that can make you trip. What can I do in the bedroom?  Use night lights.  Make sure that you have a light by your bed that is easy to reach.  Do not use any sheets or blankets that are too big for your bed. They should not hang down onto the floor.  Have a firm chair that has side arms. You can use this for support while you get dressed.  Do not have throw rugs and other things on the floor that can make you trip. What can I do in the kitchen?  Clean up any spills right away.  Avoid walking on wet floors.  Keep items that you use a lot in easy-to-reach places.  If you need to reach something above you, use a strong step stool that has a grab bar.  Keep electrical cords out of the way.  Do not use floor polish or wax that makes floors slippery. If you must use wax,  use non-skid floor wax.  Do not have throw rugs and other things on the floor that can make you trip. What can I do with my stairs?  Do not leave any items on the stairs.  Make sure that there are handrails on both sides of the stairs and use them. Fix handrails that are broken or loose. Make sure that handrails are as long as the stairways.  Check any carpeting to make sure that it is firmly attached to the stairs. Fix any carpet that is loose or worn.  Avoid having throw rugs at the top or bottom of the stairs. If you do have throw rugs, attach them to the floor with carpet tape.  Make sure that you have a light switch at the top of the stairs and the bottom of the stairs. If you do not have them, ask someone to add them for you. What else can I do to help prevent falls?  Wear shoes that:  Do not have high heels.  Have rubber bottoms.  Are comfortable and fit you well.  Are closed at the toe. Do not wear sandals.  If  you use a stepladder:  Make sure that it is fully opened. Do not climb a closed stepladder.  Make sure that both sides of the stepladder are locked into place.  Ask someone to hold it for you, if possible.  Clearly mark and make sure that you can see:  Any grab bars or handrails.  First and last steps.  Where the edge of each step is.  Use tools that help you move around (mobility aids) if they are needed. These include:  Canes.  Walkers.  Scooters.  Crutches.  Turn on the lights when you go into a dark area. Replace any light bulbs as soon as they burn out.  Set up your furniture so you have a clear path. Avoid moving your furniture around.  If any of your floors are uneven, fix them.  If there are any pets around you, be aware of where they are.  Review your medicines with your doctor. Some medicines can make you feel dizzy. This can increase your chance of falling. Ask your doctor what other things that you can do to help prevent falls. This information is not intended to replace advice given to you by your health care provider. Make sure you discuss any questions you have with your health care provider. Document Released: 08/09/2009 Document Revised: 03/20/2016 Document Reviewed: 11/17/2014 Elsevier Interactive Patient Education  2017 [employer].

## 2018-06-18 NOTE — Progress Notes (Signed)
Subjective:   Sarah [name] is a 77 y.o. female who presents for Medicare Annual (Subsequent) preventive examination.  Review of Systems:  N/A  Cardiac Risk Factors include: advanced age (>109men, >55 women);dyslipidemia;hypertension;smoking/ tobacco exposure     Objective:     Vitals: BP (!) 144/58 (BP Location: Right Arm)   Pulse 66   Temp 98.1 F (36.7 C) (Oral)   Wt 88 lb (39.9 kg)   BMI 18.39 kg/m   Body mass index is 18.39 kg/m.  Advanced Directives 06/18/2018 05/30/2017 01/26/2017 12/16/2016 05/02/2015  Does Patient Have a Medical Advance Directive? No No No No No  Would patient like information on creating a medical advance directive? No - Patient declined No - Patient declined - Yes (ED - Information included in AVS) -    Tobacco Social History   Tobacco Use  Smoking Status Current Every Day Smoker  . Packs/day: 0.50  . Years: 40.00  . Pack years: 20.00  . Types: Cigarettes  Smokeless Tobacco Never Used  Tobacco Comment   less than     Ready to quit: Not Answered Counseling given: Not Answered Comment: less than   Clinical Intake:  Pre-visit preparation completed: Yes  Pain : No/denies pain Pain Score: 0-No pain     Nutritional Status: BMI <19  Underweight Nutritional Risks: None Diabetes: No  How often do you need to have someone help you when you read instructions, pamphlets, or other written materials from your doctor or pharmacy?: 1 - Never  Interpreter Needed?: No  Information entered by :: [facility], LPN  Past Medical History:  Diagnosis Date  . Arthritis   . Dementia    ALZHEIMERS  . Ear anomaly    INNER EAR ISSUES  . Hyperlipidemia   . Hypertension    Past Surgical History:  Procedure Laterality Date  . APPENDECTOMY    . BREAST BIOPSY    . CATARACT EXTRACTION W/PHACO Right 03/09/2018   Procedure: CATARACT EXTRACTION PHACO AND INTRAOCULAR LENS PLACEMENT (IOC);  Surgeon: Birder Robson, MD;  Location: [facility] ORS;  Service:  Ophthalmology;  Laterality: Right;  [place] 00:38.9 AP% 15.9 CDE 6.19 FLUID PACK LOT # [id number] H  . CESAREAN SECTION    . CHOLECYSTECTOMY    . CYSTOSCOPY WITH STENT PLACEMENT     Family History  Problem Relation Age of Onset  . Colon cancer Father   . Pancreatic cancer Sister   . GER disease Son   . Alzheimer's disease Sister    Social History   Socioeconomic History  . Marital status: Widowed    Spouse name: Not on file  . Number of children: 2  . Years of education: Not on file  . Highest education level: High school graduate  Occupational History  . Occupation: retired  Scientific laboratory technician  . Financial resource strain: Not hard at all  . Food insecurity:    Worry: Never true    Inability: Not on file  . Transportation needs:    Medical: No    Non-medical: No  Tobacco Use  . Smoking status: Current Every Day Smoker    Packs/day: 0.50    Years: 40.00    Pack years: 20.00    Types: Cigarettes  . Smokeless tobacco: Never Used  . Tobacco comment: less than  Substance and Sexual Activity  . Alcohol use: No    Alcohol/week: 0.0 standard drinks  . Drug use: No  . Sexual activity: Not on file  Lifestyle  . Physical activity:  Days per week: Not on file    Minutes per session: Not on file  . Stress: Not at all  Relationships  . Social connections:    Talks on phone: Not on file    Gets together: Not on file    Attends religious service: Not on file    Active member of club or organization: Not on file    Attends meetings of clubs or organizations: Not on file    Relationship status: Not on file  Other Topics Concern  . Not on file  Social History Narrative  . Not on file    Outpatient Encounter Medications as of 06/18/2018  Medication Sig  . acetaminophen (TYLENOL) 500 MG tablet Take 500 mg by mouth at bedtime.   Marland Kitchen alendronate (FOSAMAX) 70 MG tablet Take 1 tablet (70 mg total) by mouth once a week. Take every Friday. Take with a full glass of water on an empty  stomach. (Patient taking differently: Take 70 mg by mouth every Monday, Tuesday, Wednesday, Thursday, and Friday. Take with a full glass of water on an empty stomach.)  . amLODipine (NORVASC) 10 MG tablet TAKE 1 TABLET BY MOUTH ONCE DAILY (Patient taking differently: Take 10 mg by mouth daily. )  . aspirin EC 81 MG tablet Take 81 mg by mouth daily.  Marland Kitchen CALCIUM CARBONATE-VITAMIN D PO Take 1 tablet by mouth 4 (four) times a week.   . cholecalciferol (VITAMIN D) 1000 units tablet Take 1,000 Units by mouth 4 (four) times a week.   . cyanocobalamin 1000 MCG tablet Take 1,000 mcg by mouth 4 (four) times a week.   . donepezil (ARICEPT) 5 MG tablet TAKE ONE TABLET BY MOUTH AT BEDTIME (Patient taking differently: Take 5 mg by mouth at bedtime. )  . lisinopril (PRINIVIL,ZESTRIL) 40 MG tablet TAKE 1 TABLET BY MOUTH ONCE DAILY (Patient taking differently: Take 40 mg by mouth daily. )  . vitamin E 400 UNIT capsule Take 400 Units by mouth 4 (four) times a week.   . pantoprazole (PROTONIX) 40 MG tablet Take 1 tablet (40 mg total) by mouth daily. (Patient not taking: Reported on 06/18/2018)   No facility-administered encounter medications on file as of 06/18/2018.     Activities of Daily Living In your present state of health, do you have any difficulty performing the following activities: 06/18/2018 02/11/2018  Hearing? N N  Vision? Y N  Comment Due to cataracts, both being removed.  -  Difficulty concentrating or making decisions? Y Y  Comment On Aricept for memory loss.  -  Walking or climbing stairs? N N  Dressing or bathing? N N  Doing errands, shopping? Y Y  Comment Does not drive.  -  Preparing Food and eating ? Y -  Comment Pt does not cook.  -  Using the Toilet? N -  In the past six months, have you accidently leaked urine? N -  Do you have problems with loss of bowel control? N -  Managing your Medications? Y -  Comment Son helps manages meds.  -  Managing your Finances? Y -  Comment Son  manages finances.  -  Housekeeping or managing your Housekeeping? N -  Comment Does not do heavy duty cleaning.  -  Some recent data might be hidden    Patient Care Team: Jerrol Banana., MD as PCP - General (Family Medicine) Murlean Iba, MD as Consulting Physician (Internal Medicine) Delana Meyer, Dolores Lory, MD (Vascular Surgery)    Assessment:  This is a routine wellness examination for [facility].  Exercise Activities and Dietary recommendations Current Exercise Habits: The patient does not participate in regular exercise at present(Son states pt stays busy moving around all day. ), Exercise limited by: orthopedic condition(s)  Goals    . Quit Smoking     Recommend to cut back on how many cigarettes smoked a day until completely quits.        Fall Risk Fall Risk  06/18/2018 02/11/2018 12/16/2016 05/02/2015  Falls in the past year? No No No No   Is the patient's home free of loose throw rugs in walkways, pet beds, electrical cords, etc?   yes      Grab bars in the bathroom? no      Handrails on the stairs?   no      Adequate lighting?   yes  Timed Get Up and Go performed: N/A  Depression Screen PHQ 2/9 Scores 06/18/2018 02/11/2018 12/16/2016 05/02/2015  PHQ - 2 Score 0 0 0 1  Exception Documentation - - - Medical reason     Cognitive Function:     6CIT Screen 06/18/2018 12/16/2016  What Year? 4 points 0 points  What month? 3 points 0 points  What time? 3 points 0 points  Count back from 20 4 points 2 points  Months in reverse 4 points 4 points  Repeat phrase 10 points 10 points  Total Score 28 16    Immunization History  Administered Date(s) Administered  . Pneumococcal Conjugate-13 08/01/2014  . Pneumococcal Polysaccharide-23 03/01/2013  . Td 02/10/1996  . Tdap 05/30/2017    Qualifies for Shingles Vaccine? Due for Shingles vaccine. Declined my offer to administer today. Education has been provided regarding the importance of this vaccine. Pt has been advised to  call her insurance company to determine her out of pocket expense. Advised she may also receive this vaccine at her local pharmacy or Health Dept. Verbalized acceptance and understanding.  Screening Tests Health Maintenance  Topic Date Due  . INFLUENZA VACCINE  05/27/2018  . TETANUS/TDAP  05/31/2027  . DEXA SCAN  Completed  . PNA vac Low Risk Adult  Completed    Cancer Screenings: Lung: Low Dose CT Chest recommended if Age 70-80 years, 30 pack-year currently smoking OR have quit w/in 15years. Patient does qualify. An Epic message has been sent to Burgess Estelle, RN (Oncology Nurse Navigator) regarding the possible need for this exam. Raquel Sarna will review the patient's chart to determine if the patient truly qualifies for the exam. If the patient qualifies, Raquel Sarna will order the Low Dose CT of the chest to facilitate the scheduling of this exam. Breast:  Up to date on Mammogram? Yes   Up to date of Bone Density/Dexa? Yes Colorectal: N/A  Additional Screenings:  Hepatitis C Screening: N/A     Plan:  I have personally reviewed and addressed the Medicare Annual Wellness questionnaire and have noted the following in the patient's chart:  A. Medical and social history B. Use of alcohol, tobacco or illicit drugs  C. Current medications and supplements D. Functional ability and status E.  Nutritional status F.  Physical activity G. Advance directives H. List of other physicians I.  Hospitalizations, surgeries, and ER visits in previous 12 months J.  [place] such as hearing and vision if needed, cognitive and depression L. Referrals and appointments - none  In addition, I have reviewed and discussed with patient certain preventive protocols, quality metrics, and best practice recommendations. A  written personalized care plan for preventive services as well as general preventive health recommendations were provided to patient.  See attached scanned questionnaire for additional  information.   Signed,  Fabio Neighbors, LPN Nurse Health Advisor   Nurse Recommendations: Chest CT referral sent to Burgess Estelle to set up apt with pt due to smoking history.

## 2018-06-22 ENCOUNTER — Ambulatory Visit: Payer: Medicare Other | Admitting: Certified Registered"

## 2018-06-22 ENCOUNTER — Ambulatory Visit
Admission: RE | Admit: 2018-06-22 | Discharge: 2018-06-22 | Disposition: A | Discharge status: Home / Self Care | Payer: Medicare Other | Source: Ambulatory Visit | Attending: Ophthalmology | Admitting: Ophthalmology

## 2018-06-22 ENCOUNTER — Other Ambulatory Visit: Payer: Self-pay

## 2018-06-22 ENCOUNTER — Encounter
Admission: RE | Disposition: A | Discharge status: Home / Self Care | Payer: Self-pay | Source: Ambulatory Visit | Attending: Ophthalmology

## 2018-06-22 DIAGNOSIS — F17200 Nicotine dependence, unspecified, uncomplicated: Secondary | ICD-10-CM | POA: Diagnosis not present

## 2018-06-22 DIAGNOSIS — E785 Hyperlipidemia, unspecified: Secondary | ICD-10-CM | POA: Diagnosis not present

## 2018-06-22 DIAGNOSIS — Z9049 Acquired absence of other specified parts of digestive tract: Secondary | ICD-10-CM | POA: Diagnosis not present

## 2018-06-22 DIAGNOSIS — Z9841 Cataract extraction status, right eye: Secondary | ICD-10-CM | POA: Diagnosis not present

## 2018-06-22 DIAGNOSIS — K219 Gastro-esophageal reflux disease without esophagitis: Secondary | ICD-10-CM | POA: Diagnosis not present

## 2018-06-22 DIAGNOSIS — I10 Essential (primary) hypertension: Secondary | ICD-10-CM | POA: Insufficient documentation

## 2018-06-22 DIAGNOSIS — N2889 Other specified disorders of kidney and ureter: Secondary | ICD-10-CM | POA: Diagnosis not present

## 2018-06-22 DIAGNOSIS — M1991 Primary osteoarthritis, unspecified site: Secondary | ICD-10-CM | POA: Insufficient documentation

## 2018-06-22 DIAGNOSIS — G309 Alzheimer's disease, unspecified: Secondary | ICD-10-CM | POA: Diagnosis not present

## 2018-06-22 DIAGNOSIS — I701 Atherosclerosis of renal artery: Secondary | ICD-10-CM | POA: Insufficient documentation

## 2018-06-22 DIAGNOSIS — I739 Peripheral vascular disease, unspecified: Secondary | ICD-10-CM | POA: Diagnosis not present

## 2018-06-22 DIAGNOSIS — F0280 Dementia in other diseases classified elsewhere without behavioral disturbance: Secondary | ICD-10-CM | POA: Insufficient documentation

## 2018-06-22 DIAGNOSIS — H2512 Age-related nuclear cataract, left eye: Secondary | ICD-10-CM | POA: Insufficient documentation

## 2018-06-22 HISTORY — PX: CATARACT EXTRACTION W/PHACO: SHX586

## 2018-06-22 SURGERY — PHACOEMULSIFICATION, CATARACT, WITH IOL INSERTION
Anesthesia: Monitor Anesthesia Care | Site: Eye | Laterality: Left | Wound class: "Clean "

## 2018-06-22 MED ORDER — LIDOCAINE HCL (PF) 4 % IJ SOLN
INTRAMUSCULAR | Status: AC
  Filled 2018-06-22: qty 5

## 2018-06-22 MED ORDER — GLYCOPYRROLATE 0.2 MG/ML IJ SOLN
INTRAMUSCULAR | Status: DC | PRN
  Administered 2018-06-22 (×3): 0.1 mg via INTRAVENOUS

## 2018-06-22 MED ORDER — FENTANYL CITRATE (PF) 100 MCG/2ML IJ SOLN
INTRAMUSCULAR | Status: DC | PRN
  Administered 2018-06-22: 50 ug via INTRAVENOUS
  Administered 2018-06-22 (×2): 25 ug via INTRAVENOUS

## 2018-06-22 MED ORDER — MOXIFLOXACIN HCL 0.5 % OP SOLN
OPHTHALMIC | Status: DC | PRN
  Administered 2018-06-22: .2 mL via OPHTHALMIC

## 2018-06-22 MED ORDER — SODIUM CHLORIDE 0.9 % IV SOLN
INTRAVENOUS | Status: DC
  Administered 2018-06-22: 08:00:00 via INTRAVENOUS

## 2018-06-22 MED ORDER — DEXMEDETOMIDINE HCL 200 MCG/2ML IV SOLN
INTRAVENOUS | Status: DC | PRN
  Administered 2018-06-22 (×2): 4 ug via INTRAVENOUS

## 2018-06-22 MED ORDER — MOXIFLOXACIN HCL 0.5 % OP SOLN
OPHTHALMIC | Status: AC
  Filled 2018-06-22: qty 3

## 2018-06-22 MED ORDER — CARBACHOL 0.01 % IO SOLN
INTRAOCULAR | Status: DC | PRN
  Administered 2018-06-22: .5 mL via INTRAOCULAR

## 2018-06-22 MED ORDER — POVIDONE-IODINE 5 % OP SOLN
OPHTHALMIC | Status: AC
  Filled 2018-06-22: qty 30

## 2018-06-22 MED ORDER — NA CHONDROIT SULF-NA HYALURON 40-17 MG/ML IO SOLN
INTRAOCULAR | Status: AC
  Filled 2018-06-22: qty 1

## 2018-06-22 MED ORDER — EPINEPHRINE PF 1 MG/ML IJ SOLN
INTRAOCULAR | Status: DC | PRN
  Administered 2018-06-22: 1 mL via OPHTHALMIC

## 2018-06-22 MED ORDER — EPINEPHRINE PF 1 MG/ML IJ SOLN
INTRAMUSCULAR | Status: AC
  Filled 2018-06-22: qty 1

## 2018-06-22 MED ORDER — FENTANYL CITRATE (PF) 100 MCG/2ML IJ SOLN
INTRAMUSCULAR | Status: AC
  Filled 2018-06-22: qty 2

## 2018-06-22 MED ORDER — ARMC OPHTHALMIC DILATING DROPS
1.0000 "application " | OPHTHALMIC | Status: AC
  Administered 2018-06-22 (×3): 1 via OPHTHALMIC

## 2018-06-22 MED ORDER — LIDOCAINE HCL (PF) 4 % IJ SOLN
INTRAOCULAR | Status: DC | PRN
  Administered 2018-06-22: 2 mL via OPHTHALMIC

## 2018-06-22 MED ORDER — NA CHONDROIT SULF-NA HYALURON 40-17 MG/ML IO SOLN
INTRAOCULAR | Status: DC | PRN
  Administered 2018-06-22: 1 mL via INTRAOCULAR

## 2018-06-22 MED ORDER — POVIDONE-IODINE 5 % OP SOLN
OPHTHALMIC | Status: DC | PRN
  Administered 2018-06-22: 1 via OPHTHALMIC

## 2018-06-22 MED ORDER — GLYCOPYRROLATE 0.2 MG/ML IJ SOLN
INTRAMUSCULAR | Status: AC
  Filled 2018-06-22: qty 2

## 2018-06-22 MED ORDER — MOXIFLOXACIN HCL 0.5 % OP SOLN: 1.0000 [drp] | OPHTHALMIC | Status: DC | PRN

## 2018-06-22 SURGICAL SUPPLY — 16 items
GLOVE BIO SURGEON STRL SZ8 (GLOVE) ×2 IMPLANT
GLOVE BIOGEL M 6.5 STRL (GLOVE) ×2 IMPLANT
GLOVE SURG LX 8.0 MICRO (GLOVE) ×1
GLOVE SURG LX STRL 8.0 MICRO (GLOVE) ×1 IMPLANT
GOWN STRL REUS W/ TWL LRG LVL3 (GOWN DISPOSABLE) ×2 IMPLANT
GOWN STRL REUS W/TWL LRG LVL3 (GOWN DISPOSABLE) ×2
LABEL CATARACT MEDS ST (LABEL) ×2 IMPLANT
LENS IOL TECNIS ITEC 20.5 (Intraocular Lens) ×1 IMPLANT
PACK CATARACT (MISCELLANEOUS) ×2 IMPLANT
PACK CATARACT BRASINGTON LX (MISCELLANEOUS) ×2 IMPLANT
PACK EYE AFTER SURG (MISCELLANEOUS) ×2 IMPLANT
SOL BSS BAG (MISCELLANEOUS) ×2
SOLUTION BSS BAG (MISCELLANEOUS) ×1 IMPLANT
SYR 5ML LL (SYRINGE) ×2 IMPLANT
WATER STERILE IRR 250ML POUR (IV SOLUTION) ×2 IMPLANT
WIPE NON LINTING 3.25X3.25 (MISCELLANEOUS) ×2 IMPLANT

## 2018-06-22 NOTE — H&P (Signed)
All labs reviewed. Abnormal studies sent to patients PCP when indicated.  Previous H&P reviewed, patient examined, there are NO CHANGES.  Sarah Oblinger Porfilio8/27/20198:21 AM

## 2018-06-22 NOTE — Anesthesia Preprocedure Evaluation (Signed)
Anesthesia Evaluation  Patient identified by MRN, date of birth, ID band Patient awake    Reviewed: Allergy & Precautions, NPO status , Patient's Chart, lab work & pertinent test results  History of Anesthesia Complications Negative for: history of anesthetic complications  Airway Mallampati: III       Dental  (+) Upper Dentures, Lower Dentures   Pulmonary neg sleep apnea, neg COPD, Current Smoker,           Cardiovascular hypertension, Pt. on medications + Peripheral Vascular Disease  (-) Past MI (-) dysrhythmias (-) Valvular Problems/Murmurs     Neuro/Psych neg Seizures Anxiety Depression Dementia    GI/Hepatic Neg liver ROS, GERD  Poorly Controlled,  Endo/Other  neg diabetes  Renal/GU Renal disease (Reanl artery stenosis)     Musculoskeletal   Abdominal   Peds  Hematology   Anesthesia Other Findings   Reproductive/Obstetrics                             Anesthesia Physical Anesthesia Plan  ASA: III  Anesthesia Plan: MAC   Post-op Pain Management:    Induction: Intravenous  PONV Risk Score and Plan:   Airway Management Planned: Nasal Cannula  Additional Equipment:   Intra-op Plan:   Post-operative Plan:   Informed Consent: I have reviewed the patients History and Physical, chart, labs and discussed the procedure including the risks, benefits and alternatives for the proposed anesthesia with the patient or authorized representative who has indicated his/her understanding and acceptance.     Plan Discussed with:   Anesthesia Plan Comments:         Anesthesia Quick Evaluation

## 2018-06-22 NOTE — Discharge Instructions (Signed)
Eye Surgery Discharge Instructions    Expect mild scratchy sensation or mild soreness. DO NOT RUB YOUR EYE!  The day of surgery:  Minimal physical activity, but bed rest is not required  No reading, computer work, or close hand work  No bending, lifting, or straining.  May watch TV  For 24 hours:  No driving, legal decisions, or alcoholic beverages  Safety precautions  Eat anything you prefer: It is better to start with liquids, then soup then solid foods.  _____ Eye patch should be worn until postoperative exam tomorrow.  ____ Solar shield eyeglasses should be worn for comfort in the sunlight/patch while sleeping  Resume all regular medications including aspirin or Coumadin if these were discontinued prior to surgery. You may shower, bathe, shave, or wash your hair. Tylenol may be taken for mild discomfort.  Call your doctor if you experience significant pain, nausea, or vomiting, fever > 101 or other signs of infection. [phone] or [phone] Specific instructions:  Follow-up Information    Birder Robson, MD Follow up.   Specialty:  Ophthalmology Why:  06/23/18 at 10:50 Contact information: [place] [address] [phone]

## 2018-06-22 NOTE — Transfer of Care (Signed)
Immediate Anesthesia Transfer of Care Note  Patient: Sarah [name]  Procedure(s) Performed: CATARACT EXTRACTION PHACO AND INTRAOCULAR LENS PLACEMENT ([place]) (Left Eye)  Patient Location: Short Stay  Anesthesia Type:MAC  Level of Consciousness: awake, alert , oriented and patient cooperative  Airway & Oxygen Therapy: Patient Spontanous Breathing  Post-op Assessment: Report given to RN, Post -op Vital signs reviewed and stable and Patient moving all extremities  Post vital signs: Reviewed and stable  Last Vitals:  Vitals Value Taken Time  BP 115/55 06/22/2018  8:49 AM  Temp 35.9 C 06/22/2018  8:49 AM  Pulse 67 06/22/2018  8:49 AM  Resp 16 06/22/2018  8:49 AM  SpO2 9 % 06/22/2018  8:49 AM    Last Pain:  Vitals:   06/22/18 0849  TempSrc: Temporal  PainSc: 0-No pain         Complications: No apparent anesthesia complications

## 2018-06-22 NOTE — Op Note (Signed)
PREOPERATIVE DIAGNOSIS:  Nuclear sclerotic cataract of the left eye.   POSTOPERATIVE DIAGNOSIS:  Nuclear sclerotic cataract of the left eye.   OPERATIVE PROCEDURE: Procedure(s): CATARACT EXTRACTION PHACO AND INTRAOCULAR LENS PLACEMENT (IOC)   SURGEON:  Birder Robson, MD.   ANESTHESIA:  Anesthesiologist: Gunnar Fusi, MD CRNA: Nile Riggs, CRNA  1.      Managed anesthesia care. 2.     0.11ml of Shugarcaine was instilled following the paracentesis   COMPLICATIONS:  None.   TECHNIQUE:   Stop and chop   DESCRIPTION OF PROCEDURE:  The patient was examined and consented in the preoperative holding area where the aforementioned topical anesthesia was applied to the left eye and then brought back to the Operating Room where the left eye was prepped and draped in the usual sterile ophthalmic fashion and a lid speculum was placed. A paracentesis was created with the side port blade and the anterior chamber was filled with viscoelastic. A near clear corneal incision was performed with the steel keratome. A continuous curvilinear capsulorrhexis was performed with a cystotome followed by the capsulorrhexis forceps. Hydrodissection and hydrodelineation were carried out with BSS on a blunt cannula. The lens was removed in a stop and chop  technique and the remaining cortical material was removed with the irrigation-aspiration handpiece. The capsular bag was inflated with viscoelastic and the Technis [id number] lens was placed in the capsular bag without complication. The remaining viscoelastic was removed from the eye with the irrigation-aspiration handpiece. The wounds were hydrated. The anterior chamber was flushed with Miostat and the eye was inflated to physiologic pressure. 0.55ml Vigamox was placed in the anterior chamber. The wounds were found to be water tight. The eye was dressed with Vigamox. The patient was given protective glasses to wear throughout the day and a shield with which to  sleep tonight. The patient was also given drops with which to begin a drop regimen today and will follow-up with me in one day. Implant Name Type Inv. Item Serial No. Manufacturer Lot No. LRB No. Used  LENS IOL DIOP 20.5 - [id number] Intraocular Lens LENS IOL DIOP 20.5 [id number] AMO  Left 1    Procedure(s) with comments: CATARACT EXTRACTION PHACO AND INTRAOCULAR LENS PLACEMENT (IOC) (Left) - [place] 00:50 AP% 16.5 CDE 8.25 Fluid pack lot # [id number]  Electronically signed: Birder Robson 06/22/2018 8:48 AM

## 2018-06-22 NOTE — Anesthesia Postprocedure Evaluation (Signed)
Anesthesia Post Note  Patient: Ebonie Westerlund  Procedure(s) Performed: CATARACT EXTRACTION PHACO AND INTRAOCULAR LENS PLACEMENT ([place]) (Left Eye)  Patient location during evaluation: Short Stay Anesthesia Type: MAC Level of consciousness: awake and alert, oriented and patient cooperative Pain management: satisfactory to patient Vital Signs Assessment: post-procedure vital signs reviewed and stable Respiratory status: spontaneous breathing and respiratory function stable Cardiovascular status: blood pressure returned to baseline and stable Postop Assessment: no backache, no headache, patient able to bend at knees, no apparent nausea or vomiting, adequate PO intake and able to ambulate Anesthetic complications: no     Last Vitals:  Vitals:   06/22/18 0733 06/22/18 0849  BP: 128/64 (!) 115/55  Pulse: (!) 48 67  Resp: 16 16  Temp: 36.5 C (!) 35.9 C  SpO2: 100% (!) 9%    Last Pain:  Vitals:   06/22/18 0849  TempSrc: Temporal  PainSc: 0-No pain                 Tifini Reeder H Kerianne Gurr

## 2018-06-22 NOTE — Anesthesia Post-op Follow-up Note (Signed)
Anesthesia QCDR form completed.        

## 2018-06-30 ENCOUNTER — Telehealth: Payer: Self-pay | Admitting: *Deleted

## 2018-06-30 NOTE — Telephone Encounter (Signed)
Spoke with patient regarding lung screening scan due and obtained updated smoking information. Patient request that I speak with her son regarding scheduling of lung screening scan. Voicemail left for him to call me back.

## 2018-07-03 ENCOUNTER — Telehealth: Payer: Self-pay

## 2018-07-03 NOTE — Telephone Encounter (Signed)
Call pt regarding lung screening scan  Left message on  son and grandson phones on 07-03-18 at 1:45

## 2018-07-06 ENCOUNTER — Telehealth: Payer: Self-pay | Admitting: Nurse Practitioner

## 2018-07-08 IMAGING — CT CT HEAD W/O CM
4 series · 16 of 47 positions shown, 18 images · non-contrast
Comparison: None.

CLINICAL DATA: Fell removing object from shelf, struck head.
History of Alzheimer's disease, hypertension and hyperlipidemia.

EXAM:
CT HEAD WITHOUT CONTRAST
TECHNIQUE: Contiguous axial images were obtained from the base of the skull
through the vertex without intravenous contrast.

[Series 2: head wo · axial · 0.35mm/px · z∈[-96,-6]mm · 7 of 26 slices shown, 9 images]
[im 4/26  brain]
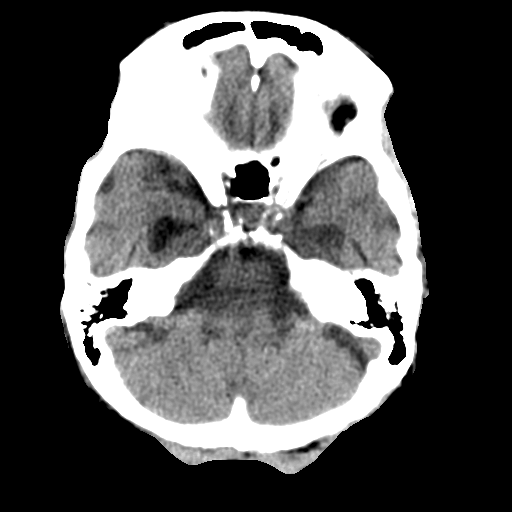
[im 4/26  bone]
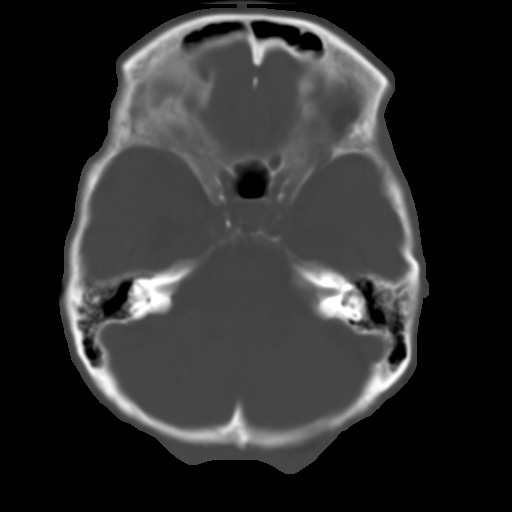
[im 7/26  brain]
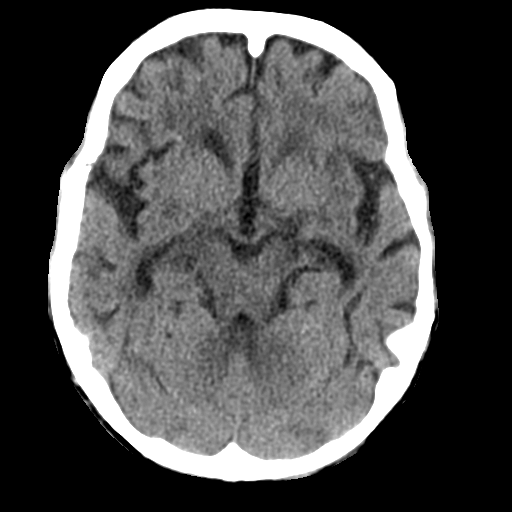
[im 10/26  brain]
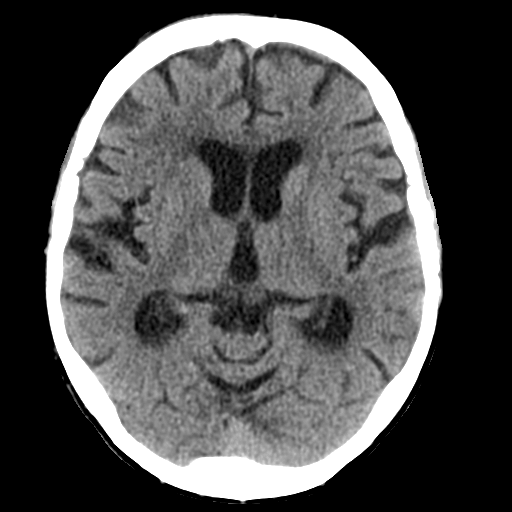
[im 13/26  brain]
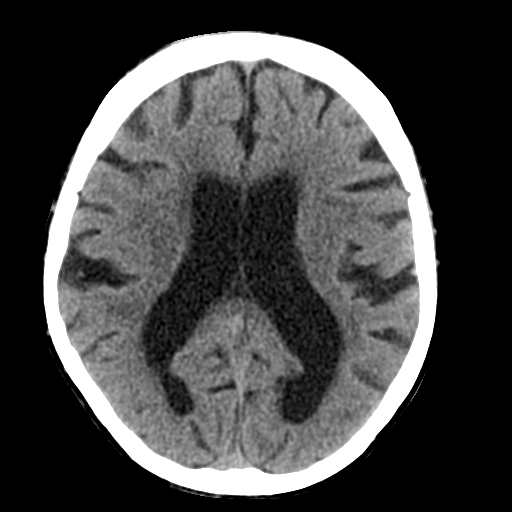
[im 16/26  brain]
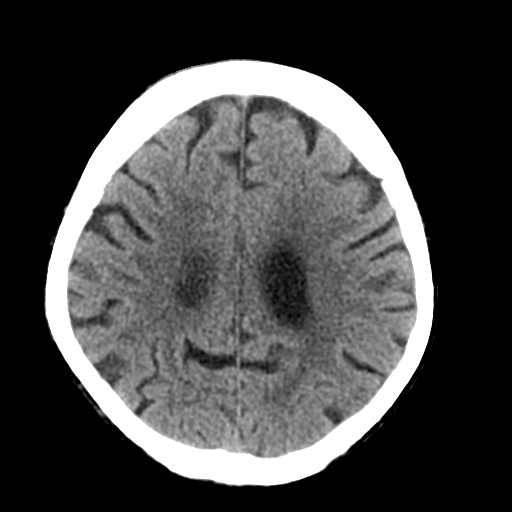
[im 16/26  bone]
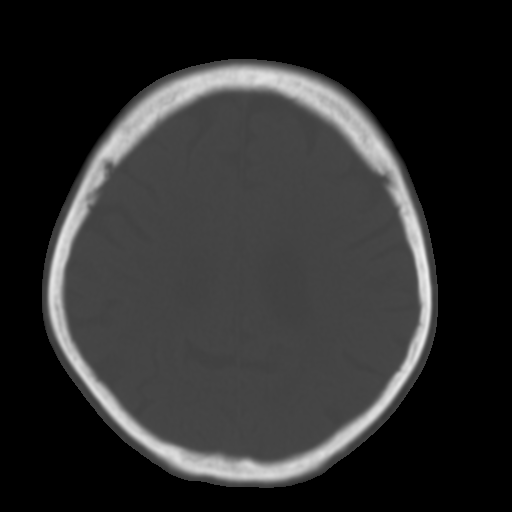
[im 19/26  brain]
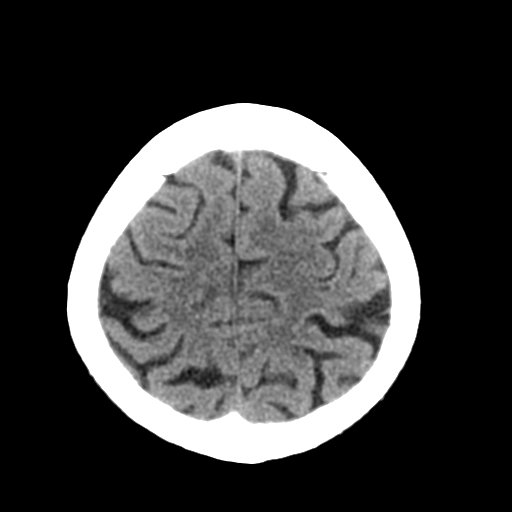
[im 22/26  brain]
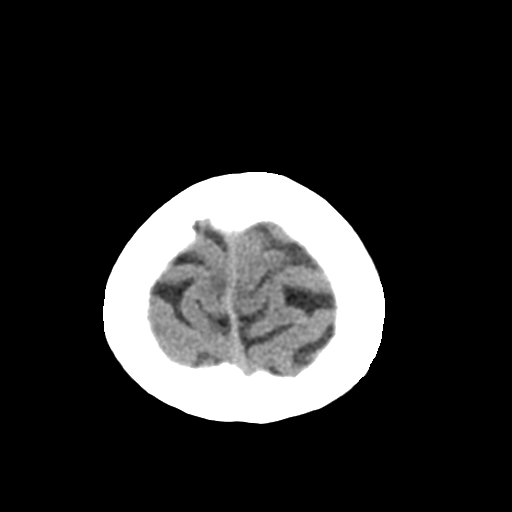

[Series 3: head bone · axial · 0.35mm/px · z∈[-99,-75]mm · 3 of 64 slices shown]
[im 7/64  bone]
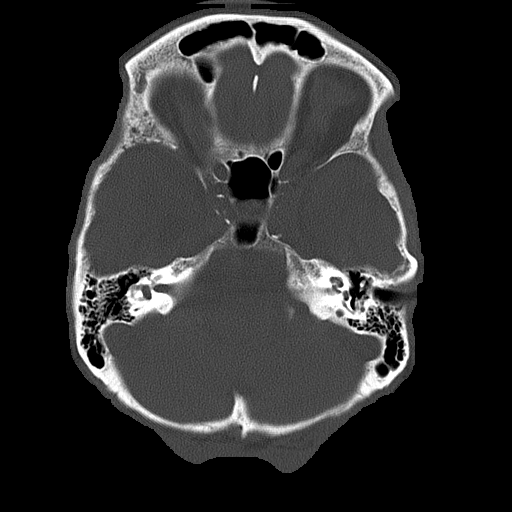
[im 13/64  bone]
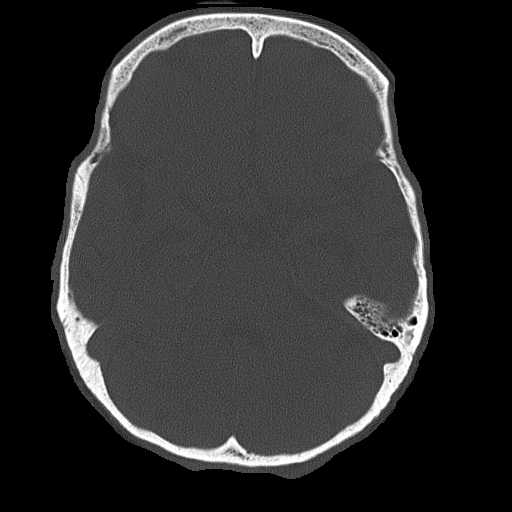
[im 19/64  bone]
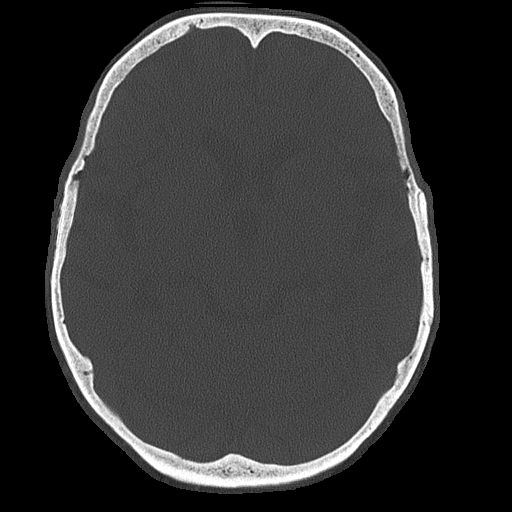

[Series 4: coronal soft tissue · coronal · 0.25mm/px · 3 of 61 slices shown]
[im 21/61  brain]
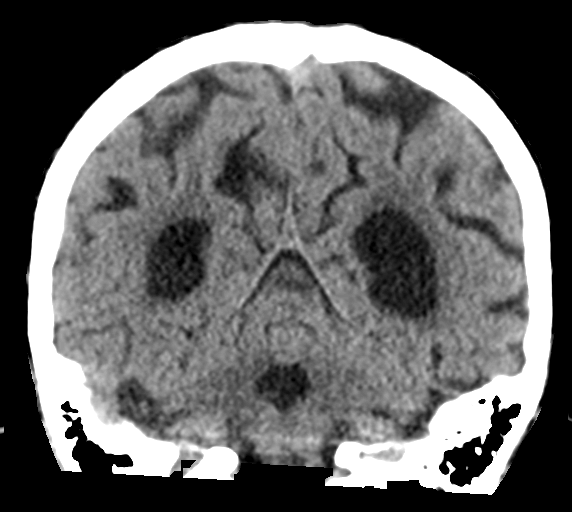
[im 27/61  brain]
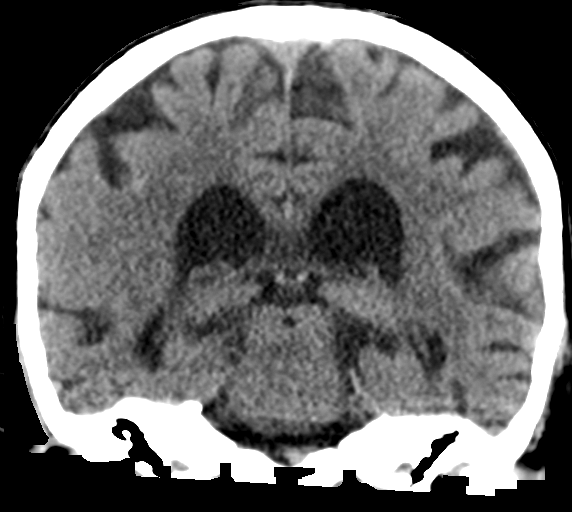
[im 34/61  brain]
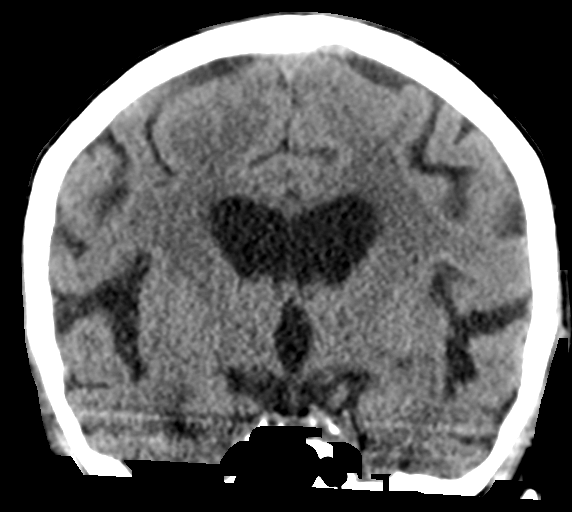

[Series 5: sagittal soft tissue · sagittal · 0.25mm/px · 3 of 49 slices shown]
[im 17/49  brain]
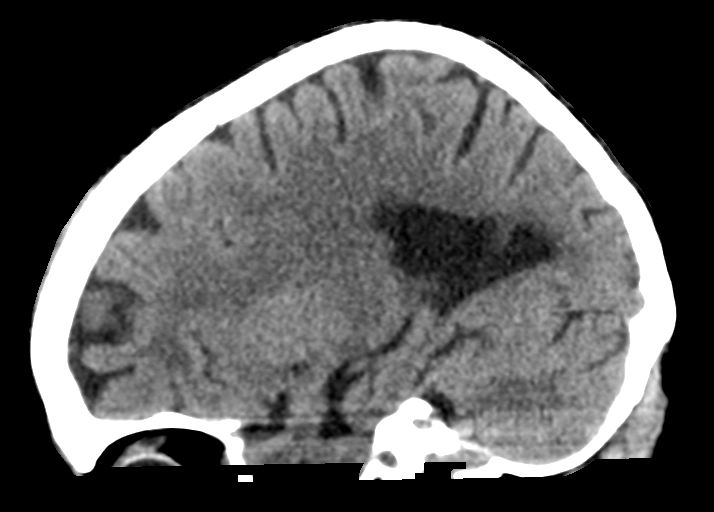
[im 25/49  brain]
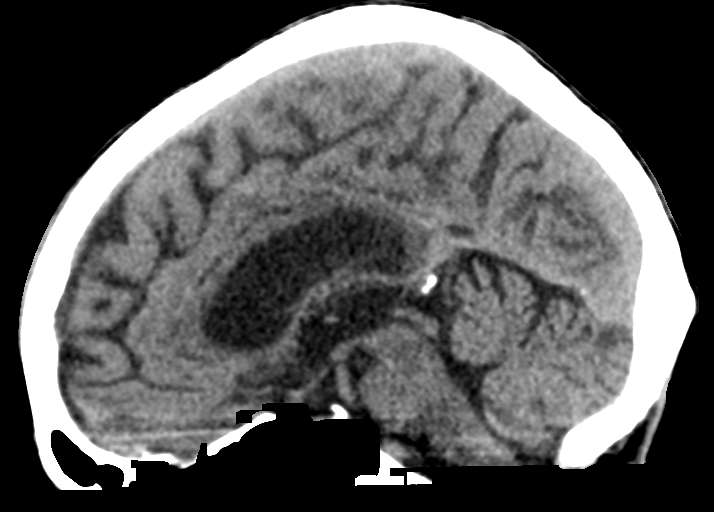
[im 33/49  brain]
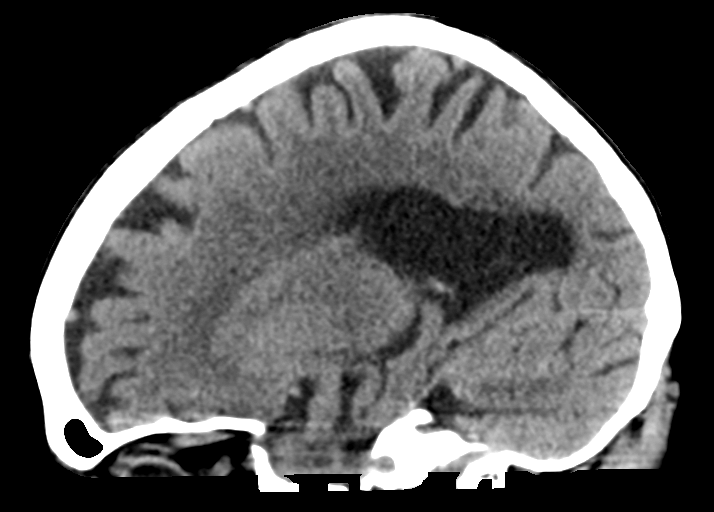

[16 of 47 positions shown; findings below may reference images not displayed]

FINDINGS: BRAIN: No intraparenchymal hemorrhage, mass effect nor midline
shift. Moderate ventriculomegaly with disproportionate medial
temporal lobe volume loss. Patchy supratentorial white matter
hypodensities within normal range for patient's age, though
non-specific are most compatible with chronic small vessel ischemic
disease. No acute large vascular territory infarcts. No abnormal
extra-axial fluid collections. Basal cisterns are patent.

VASCULAR: Moderate calcific atherosclerosis of the carotid siphons.

SKULL: No skull fracture. Moderate RIGHT frontal scalp hematoma and
superficial defect.

SINUSES/ORBITS: The mastoid air-cells and included paranasal sinuses
are well-aerated.The included ocular globes and orbital contents are
non-suspicious.

OTHER: None.
IMPRESSION: 1. No acute intracranial process. Moderate RIGHT frontal scalp
hematoma and laceration without skull fracture.
2. Temporal lobe volume loss associated with neuro degenerative
disorder.

## 2018-09-13 ENCOUNTER — Encounter: Payer: Self-pay | Admitting: Family Medicine

## 2018-09-13 ENCOUNTER — Ambulatory Visit (INDEPENDENT_AMBULATORY_CARE_PROVIDER_SITE_OTHER): Payer: Medicare Other | Admitting: Family Medicine

## 2018-09-13 VITALS — BP 142/68 | HR 68 | Temp 98.2°F | Resp 16 | Ht 59.0 in | Wt 88.0 lb

## 2018-09-13 DIAGNOSIS — F0280 Dementia in other diseases classified elsewhere without behavioral disturbance: Secondary | ICD-10-CM | POA: Diagnosis not present

## 2018-09-13 DIAGNOSIS — G301 Alzheimer's disease with late onset: Secondary | ICD-10-CM | POA: Diagnosis not present

## 2018-09-13 DIAGNOSIS — I150 Renovascular hypertension: Secondary | ICD-10-CM

## 2018-09-13 DIAGNOSIS — M25512 Pain in left shoulder: Secondary | ICD-10-CM

## 2018-09-13 NOTE — Progress Notes (Signed)
Patient: Sarah [name] Female    DOB: 01/23/1941   77 y.o.   MRN: [id number] Visit Date: 09/13/2018  Today's Provider: Wilhemena Durie, MD   Chief Complaint  Patient presents with  . Hypertension   Subjective:    HPI  Hypertension, follow-up:  BP Readings from Last 3 Encounters:  09/13/18 (!) 142/68  06/22/18 107/62  06/18/18 (!) 144/58    She was last seen for hypertension 3 months ago.  BP at that visit was 144/58. Management since that visit includes no changes. She reports good compliance with treatment. She is not having side effects.  She is not exercising. She is adherent to low salt diet.   Outside blood pressures are not being checked. She is experiencing none.  Patient denies exertional chest pressure/discomfort, lower extremity edema and palpitations.   Cardiovascular risk factors include dyslipidemia.   Weight trend: stable Wt Readings from Last 3 Encounters:  09/13/18 88 lb (39.9 kg)  06/14/18 91 lb (41.3 kg)  06/18/18 88 lb (39.9 kg)   Shoulder pain Patient reports that she is still having pain in her left shoulder. She reports that she was supposed to have an xray, but never did.  Allergies  Allergen Reactions  . Penicillins Hives and Other (See Comments)    Has patient had a PCN reaction causing immediate rash, facial/tongue/throat swelling, SOB or lightheadedness with hypotension: No Has patient had a PCN reaction causing severe rash involving mucus membranes or skin necrosis: No Has patient had a PCN reaction that required hospitalization: No Has patient had a PCN reaction occurring within the last 10 years: No  If all of the above answers are "NO", then may proceed with Cephalosporin use.      Current Outpatient Medications:  .  acetaminophen (TYLENOL) 500 MG tablet, Take 500 mg by mouth at bedtime. , Disp: , Rfl:  .  alendronate (FOSAMAX) 70 MG tablet, Take 1 tablet (70 mg total) by mouth once a week. Take every Friday.  Take with a full glass of water on an empty stomach. (Patient taking differently: Take 70 mg by mouth every Monday, Tuesday, Wednesday, Thursday, and Friday. Take with a full glass of water on an empty stomach.), Disp: 12 tablet, Rfl: 3 .  amLODipine (NORVASC) 10 MG tablet, TAKE 1 TABLET BY MOUTH ONCE DAILY (Patient taking differently: Take 10 mg by mouth daily. ), Disp: 90 tablet, Rfl: 3 .  aspirin EC 81 MG tablet, Take 81 mg by mouth daily., Disp: , Rfl:  .  CALCIUM CARBONATE-VITAMIN D PO, Take 1 tablet by mouth 4 (four) times a week. , Disp: , Rfl:  .  cholecalciferol (VITAMIN D) 1000 units tablet, Take 1,000 Units by mouth 4 (four) times a week. , Disp: , Rfl:  .  cyanocobalamin 1000 MCG tablet, Take 1,000 mcg by mouth 4 (four) times a week. , Disp: , Rfl:  .  donepezil (ARICEPT) 5 MG tablet, TAKE ONE TABLET BY MOUTH AT BEDTIME (Patient taking differently: Take 5 mg by mouth at bedtime. ), Disp: 90 tablet, Rfl: 3 .  lisinopril (PRINIVIL,ZESTRIL) 40 MG tablet, TAKE 1 TABLET BY MOUTH ONCE DAILY (Patient taking differently: Take 40 mg by mouth daily. ), Disp: 90 tablet, Rfl: 3 .  pantoprazole (PROTONIX) 40 MG tablet, Take 1 tablet (40 mg total) by mouth daily., Disp: 30 tablet, Rfl: 3 .  vitamin E 400 UNIT capsule, Take 400 Units by mouth 4 (four) times a week. , Disp: ,  Rfl:   Review of Systems  Constitutional: Negative.   HENT: Negative.   Eyes: Negative.   Respiratory: Negative for cough and shortness of breath.   Cardiovascular: Negative for chest pain, palpitations and leg swelling.  Musculoskeletal: Positive for arthralgias and myalgias. Negative for back pain, gait problem, joint swelling and neck stiffness.  Neurological: Negative for dizziness, weakness, light-headedness and headaches.  Psychiatric/Behavioral: Negative.        Progressive memory loss without any behavior disturbances.    Social History   Tobacco Use  . Smoking status: Current Every Day Smoker    Packs/day: 0.50      Years: 40.00    Pack years: 20.00    Types: Cigarettes  . Smokeless tobacco: Never Used  . Tobacco comment: less than  Substance Use Topics  . Alcohol use: No    Alcohol/week: 0.0 standard drinks   Objective:   BP (!) 142/68 (BP Location: Left Arm, Patient Position: Sitting, Cuff Size: Normal)   Pulse 68   Temp 98.2 F (36.8 C)   Resp 16   Ht 4\' 11"  (1.499 m)   Wt 88 lb (39.9 kg)   SpO2 99%   BMI 17.77 kg/m  Vitals:   09/13/18 1119  BP: (!) 142/68  Pulse: 68  Resp: 16  Temp: 98.2 F (36.8 C)  SpO2: 99%  Weight: 88 lb (39.9 kg)  Height: 4\' 11"  (1.499 m)     Physical Exam  Constitutional: She is oriented to person, place, and time. She appears well-developed and well-nourished.  Cachectic  female in no acute distress  HENT:  Head: Normocephalic and atraumatic.  Right Ear: External ear normal.  Left Ear: External ear normal.  Nose: Nose normal.  Eyes: No scleral icterus.  Neck: No thyromegaly present.  Cardiovascular: Normal rate, regular rhythm and normal heart sounds.  Pulmonary/Chest: Effort normal and breath sounds normal.  Abdominal: Soft.  Musculoskeletal: She exhibits no edema.  Neurological: She is alert and oriented to person, place, and time.  Skin: Skin is warm and dry.  Psychiatric: She has a normal mood and affect. Her behavior is normal. Judgment and thought content normal.        Assessment & Plan:     1. Left shoulder pain, unspecified chronicity This is a chronic problem.  The arthritis versus rotator arthropathy.  Obtain x-ray and refer to Dr. Jacinto Reap. - DG Shoulder Left; Future  2. Hypertension, renovascular   3. Late onset Alzheimer's disease without behavioral disturbance ([place]) She is now getting Meals on Wheels.  He is alone during the day.  If she starts wandering they may need to adjust her living situation To clinic in 3 months.     I have done the exam and reviewed the above chart and it is accurate to the best of my knowledge.  Development worker, community has been used in this note in any air is in the dictation or transcription are unintentional.  Wilhemena Durie, MD  [place]

## 2018-09-22 ENCOUNTER — Ambulatory Visit
Admission: RE | Admit: 2018-09-22 | Discharge: 2018-09-22 | Disposition: A | Discharge status: Home / Self Care | Payer: Medicare Other | Source: Ambulatory Visit | Attending: Family Medicine | Admitting: Family Medicine

## 2018-09-22 DIAGNOSIS — M25512 Pain in left shoulder: Secondary | ICD-10-CM

## 2018-09-22 DIAGNOSIS — M19012 Primary osteoarthritis, left shoulder: Secondary | ICD-10-CM | POA: Insufficient documentation

## 2018-09-27 ENCOUNTER — Telehealth: Payer: Self-pay

## 2018-09-27 NOTE — Telephone Encounter (Signed)
-----   Message from Jerrol Banana., MD sent at 09/22/2018  4:56 PM EST ----- Some arthritis of AC joint.

## 2018-09-27 NOTE — Telephone Encounter (Signed)
Tried calling patient, no answer. Will try again later.  

## 2018-09-28 NOTE — Telephone Encounter (Signed)
Spoke with patient grandson [name] (per DRP) and advised him.

## 2018-09-28 NOTE — Telephone Encounter (Signed)
Tylenol or Topical OTC products.

## 2018-09-28 NOTE — Telephone Encounter (Signed)
Dr. Rosanna Randy what would you recommend patient to take otc if she does experience arthritic pain? KW

## 2018-10-12 ENCOUNTER — Ambulatory Visit (INDEPENDENT_AMBULATORY_CARE_PROVIDER_SITE_OTHER): Payer: Medicare Other | Admitting: Family Medicine

## 2018-10-12 ENCOUNTER — Encounter: Payer: Self-pay | Admitting: Family Medicine

## 2018-10-12 VITALS — BP 138/82 | HR 64 | Temp 98.4°F | Resp 16 | Wt 95.0 lb

## 2018-10-12 DIAGNOSIS — I150 Renovascular hypertension: Secondary | ICD-10-CM

## 2018-10-12 DIAGNOSIS — G301 Alzheimer's disease with late onset: Secondary | ICD-10-CM | POA: Diagnosis not present

## 2018-10-12 DIAGNOSIS — R634 Abnormal weight loss: Secondary | ICD-10-CM

## 2018-10-12 DIAGNOSIS — M1990 Unspecified osteoarthritis, unspecified site: Secondary | ICD-10-CM

## 2018-10-12 DIAGNOSIS — F0280 Dementia in other diseases classified elsewhere without behavioral disturbance: Secondary | ICD-10-CM

## 2018-10-12 NOTE — Progress Notes (Signed)
Patient: Sarah [name] Female    DOB: Mar 02, 1941   77 y.o.   MRN: [id number] Visit Date: 10/12/2018  Today's Provider: Wilhemena Durie, MD   Chief Complaint  Patient presents with  . Follow-up   Subjective:     HPI  Patient comes in today accompanied with her grandson to discuss worsening dementia. He reports that her symptoms are getting worse.  She does not wander or have any behavioral issues at this time.  He is gradually getting much worse.  Patient also wanted to discuss shoulder pain. Patient had her xray on 09/22/18 and it showed arthritis. It was recommended she see Dr. B for cortisone injection.    Allergies  Allergen Reactions  . Penicillins Hives and Other (See Comments)    Has patient had a PCN reaction causing immediate rash, facial/tongue/throat swelling, SOB or lightheadedness with hypotension: No Has patient had a PCN reaction causing severe rash involving mucus membranes or skin necrosis: No Has patient had a PCN reaction that required hospitalization: No Has patient had a PCN reaction occurring within the last 10 years: No  If all of the above answers are "NO", then may proceed with Cephalosporin use.      Current Outpatient Medications:  .  acetaminophen (TYLENOL) 500 MG tablet, Take 500 mg by mouth at bedtime. , Disp: , Rfl:  .  alendronate (FOSAMAX) 70 MG tablet, Take 1 tablet (70 mg total) by mouth once a week. Take every Friday. Take with a full glass of water on an empty stomach. (Patient taking differently: Take 70 mg by mouth every Monday, Tuesday, Wednesday, Thursday, and Friday. Take with a full glass of water on an empty stomach.), Disp: 12 tablet, Rfl: 3 .  amLODipine (NORVASC) 10 MG tablet, TAKE 1 TABLET BY MOUTH ONCE DAILY (Patient taking differently: Take 10 mg by mouth daily. ), Disp: 90 tablet, Rfl: 3 .  aspirin EC 81 MG tablet, Take 81 mg by mouth daily., Disp: , Rfl:  .  CALCIUM CARBONATE-VITAMIN D PO, Take 1 tablet by mouth 4  (four) times a week. , Disp: , Rfl:  .  cholecalciferol (VITAMIN D) 1000 units tablet, Take 1,000 Units by mouth 4 (four) times a week. , Disp: , Rfl:  .  cyanocobalamin 1000 MCG tablet, Take 1,000 mcg by mouth 4 (four) times a week. , Disp: , Rfl:  .  donepezil (ARICEPT) 5 MG tablet, TAKE ONE TABLET BY MOUTH AT BEDTIME (Patient taking differently: Take 5 mg by mouth at bedtime. ), Disp: 90 tablet, Rfl: 3 .  lisinopril (PRINIVIL,ZESTRIL) 40 MG tablet, TAKE 1 TABLET BY MOUTH ONCE DAILY (Patient taking differently: Take 40 mg by mouth daily. ), Disp: 90 tablet, Rfl: 3 .  pantoprazole (PROTONIX) 40 MG tablet, Take 1 tablet (40 mg total) by mouth daily., Disp: 30 tablet, Rfl: 3 .  vitamin E 400 UNIT capsule, Take 400 Units by mouth 4 (four) times a week. , Disp: , Rfl:   Review of Systems  Constitutional: Negative.   HENT: Negative.   Eyes: Negative.   Respiratory: Negative for cough and shortness of breath.   Cardiovascular: Negative for chest pain and leg swelling.  Gastrointestinal: Negative.   Endocrine: Negative.   Musculoskeletal: Positive for arthralgias and myalgias. Negative for neck pain and neck stiffness.  Allergic/Immunologic: Negative.   Psychiatric/Behavioral: Positive for confusion and decreased concentration. Negative for agitation, self-injury, sleep disturbance and suicidal ideas. The patient is not nervous/anxious and is  not hyperactive.     Social History   Tobacco Use  . Smoking status: Current Every Day Smoker    Packs/day: 0.50    Years: 40.00    Pack years: 20.00    Types: Cigarettes  . Smokeless tobacco: Never Used  . Tobacco comment: less than  Substance Use Topics  . Alcohol use: No    Alcohol/week: 0.0 standard drinks      Objective:   BP 138/82 (BP Location: Left Arm, Patient Position: Sitting, Cuff Size: Normal)   Pulse 64   Temp 98.4 F (36.9 C)   Resp 16   Wt 95 lb (43.1 kg)   SpO2 96%   BMI 19.19 kg/m  Vitals:   10/12/18 1610  BP: 138/82    Pulse: 64  Resp: 16  Temp: 98.4 F (36.9 C)  SpO2: 96%  Weight: 95 lb (43.1 kg)     Physical Exam Constitutional:      Appearance: She is well-developed.  HENT:     Head: Normocephalic and atraumatic.  Eyes:     General: No scleral icterus. Neck:     Thyroid: No thyromegaly.  Cardiovascular:     Rate and Rhythm: Normal rate and regular rhythm.     Heart sounds: Normal heart sounds.  Pulmonary:     Effort: Pulmonary effort is normal.     Breath sounds: Normal breath sounds.  Abdominal:     Palpations: Abdomen is soft.  Musculoskeletal:        General: No swelling.  Skin:    General: Skin is warm and dry.  Neurological:     General: No focal deficit present.     Mental Status: She is alert. Mental status is at baseline.  Psychiatric:        Mood and Affect: Mood normal.        Behavior: Behavior normal.        Thought Content: Thought content normal.        Judgment: Judgment normal.         Assessment & Plan    1. Late onset Alzheimer's disease without behavioral disturbance ([place]) MMSE is 0/30 today.  Patient is unable to cooperate.  There are no behavioral issues.  Would like for CCM to start working with family to possibly find memory care unit placement for this nice lady.  More than 50% of this 30-minute visit is spent in counseling and coordination of care. - Ambulatory referral to Chronic Care Management Services  2. Hypertension, renovascular   3. Osteoarthritis, unspecified osteoarthritis type, unspecified site Chronic shoulder pain.  Can refer to Dr. B to see as she might be willing to inject the shoulder to give her some relief.  4. Weight loss Consistent with progressive dementia.  Follow.  I have done the exam and reviewed the chart and it is accurate to the best of my knowledge. Development worker, community has been used and  any errors in dictation or transcription are unintentional. Miguel Aschoff M.D. [place], MD  [facility]

## 2018-10-15 ENCOUNTER — Ambulatory Visit: Payer: Self-pay

## 2018-10-15 DIAGNOSIS — E46 Unspecified protein-calorie malnutrition: Secondary | ICD-10-CM

## 2018-10-15 DIAGNOSIS — I150 Renovascular hypertension: Secondary | ICD-10-CM

## 2018-10-15 DIAGNOSIS — G301 Alzheimer's disease with late onset: Principal | ICD-10-CM

## 2018-10-15 DIAGNOSIS — F0280 Dementia in other diseases classified elsewhere without behavioral disturbance: Secondary | ICD-10-CM

## 2018-10-15 DIAGNOSIS — F0390 Unspecified dementia without behavioral disturbance: Secondary | ICD-10-CM

## 2018-10-15 NOTE — Progress Notes (Signed)
Erroneous Encounter

## 2018-10-15 NOTE — Chronic Care Management (AMB) (Signed)
   Chronic Care Management   Note  10/15/2018 Name: Sarah [name] MRN: [id number] DOB: 11/22/1940  77 y.o. year old female referred to Chronic Care Management by Dr. Rosanna Randy for assistance with custodial care options for family secondary to her dementia/Alzheimers. Chronic conditions include HTN, dementia, depression, HLD. Last office visit with Jerrol Banana., MD was 10/12/18.   RN CM was unable to speak with son or grandson during telephone encounter today. Patient answered phone at given number for grandson. Patient states her grandson was unavailable as he had left for the airport. He is first contact on DPR. Patient states he lives with her but is moving. States her son (second person on [place]) is also not available. Patient did not understand introduction to CCM Services and wished for RN CM to speak with son. Left HIPAA compliant voicemail on son's cell requesting call back.  Plan: Will follow-up with patients son [name] within 3-5  business days via telephone.   Taye Cato E. Rollene Rotunda, RN, BSN Nurse Care Coordinator [facility] Care Management [phone]

## 2018-10-21 ENCOUNTER — Ambulatory Visit: Payer: Self-pay

## 2018-10-21 ENCOUNTER — Telehealth: Payer: Self-pay

## 2018-10-21 DIAGNOSIS — F0390 Unspecified dementia without behavioral disturbance: Secondary | ICD-10-CM

## 2018-10-21 DIAGNOSIS — F0280 Dementia in other diseases classified elsewhere without behavioral disturbance: Secondary | ICD-10-CM

## 2018-10-21 DIAGNOSIS — R634 Abnormal weight loss: Secondary | ICD-10-CM

## 2018-10-21 DIAGNOSIS — G301 Alzheimer's disease with late onset: Principal | ICD-10-CM

## 2018-10-21 DIAGNOSIS — I150 Renovascular hypertension: Secondary | ICD-10-CM

## 2018-10-21 NOTE — Chronic Care Management (AMB) (Signed)
  Chronic Care Management   Note  10/21/2018 Name: Sarah [name] MRN: [id number] DOB: November 15, 1940   77 y.o. year old female referred to Chronic Care Management by Dr. Rosanna Randy for assistance with custodial care options for family secondary to her dementia/Alzheimers. Chronic conditions include HTN, dementia, depression, HLD. Last office visit with Jerrol Banana., MD was 10/12/18.   CCM RN CM was able to to reach patient's Sarah [name] via telephone today to discuss patient's need for custodial care options and introduction of CCM services. Mr. Sarah [name] and patients son Sarah [name] accepted services on behalf of patient and requested an appointment with the CCM Team face to face to discuss custodial care options.  [name]) was given information about Chronic Care Management services today including:  1. CCM service includes personalized support from designated clinical staff supervised by her physician, including individualized plan of care and coordination with other care providers 2. 24/7 contact phone numbers for assistance for urgent and routine care needs. 3. Service will only be billed when office clinical staff spend 20 minutes or more in a month to coordinate care. 4. Only one practitioner may furnish and bill the service in a calendar month. 5. The patient may stop CCM services at any time (effective at the end of the month) by phone call to the office staff. 6. The patient will be responsible for cost sharing (co-pay) of up to 20% of the service fee (after annual deductible is met).  [name] agreed to services on patient's behalf and verbal consent obtained.  Plan: CCM Team will meet with patient's son and grandson on 11/03/17 at 10:15    E. Rollene Rotunda, RN, BSN Nurse Care Coordinator [facility] Care [facility] [phone]

## 2018-10-26 ENCOUNTER — Ambulatory Visit: Payer: Medicare Other | Admitting: Family Medicine

## 2018-10-26 ENCOUNTER — Encounter: Payer: Self-pay | Admitting: Family Medicine

## 2018-10-26 VITALS — BP 146/68 | HR 50 | Temp 98.1°F | Wt 94.4 lb

## 2018-10-26 DIAGNOSIS — M19012 Primary osteoarthritis, left shoulder: Secondary | ICD-10-CM

## 2018-10-26 MED ORDER — METHYLPREDNISOLONE ACETATE 40 MG/ML IJ SUSP
80.0000 mg | Freq: Once | INTRAMUSCULAR | Status: AC
  Administered 2018-10-26: 80 mg via INTRA_ARTICULAR

## 2018-10-26 MED ORDER — LIDOCAINE-EPINEPHRINE 2 %-1:100000 IJ SOLN
4.0000 mL | Freq: Once | INTRAMUSCULAR | Status: AC
  Administered 2018-10-26: 4 mL

## 2018-10-26 NOTE — Patient Instructions (Signed)

## 2018-10-26 NOTE — Progress Notes (Signed)
Patient: Sarah [name] Female    DOB: November 20, 1940   77 y.o.   MRN: [id number] Visit Date: 10/26/2018  Today's Provider: Lavon Paganini, MD   Chief Complaint  Patient presents with  . Shoulder Pain   Subjective:    I, Tiburcio Pea, CMA, am acting as a scribe for Lavon Paganini, MD.    HPI Patient presents today C/O left shoulder pain. She reports the pain started several years ago. She is requesting a cortisone injection.  She is left hand dominant and has pain with any movement.  Personally reviewed x-ray images and report from 09/22/2018.  Left shoulder shows moderate AC joint osteoarthritis degenerative changes and narrowed subacromial space.  She also has mild glenohumeral degenerative changes.   Allergies  Allergen Reactions  . Penicillins Hives and Other (See Comments)    Has patient had a PCN reaction causing immediate rash, facial/tongue/throat swelling, SOB or lightheadedness with hypotension: No Has patient had a PCN reaction causing severe rash involving mucus membranes or skin necrosis: No Has patient had a PCN reaction that required hospitalization: No Has patient had a PCN reaction occurring within the last 10 years: No  If all of the above answers are "NO", then may proceed with Cephalosporin use.      Current Outpatient Medications:  .  acetaminophen (TYLENOL) 500 MG tablet, Take 500 mg by mouth at bedtime. , Disp: , Rfl:  .  alendronate (FOSAMAX) 70 MG tablet, Take 1 tablet (70 mg total) by mouth once a week. Take every Friday. Take with a full glass of water on an empty stomach. (Patient taking differently: Take 70 mg by mouth every Monday, Tuesday, Wednesday, Thursday, and Friday. Take with a full glass of water on an empty stomach.), Disp: 12 tablet, Rfl: 3 .  amLODipine (NORVASC) 10 MG tablet, TAKE 1 TABLET BY MOUTH ONCE DAILY (Patient taking differently: Take 10 mg by mouth daily. ), Disp: 90 tablet, Rfl: 3 .  aspirin EC 81 MG tablet, Take 81 mg by  mouth daily., Disp: , Rfl:  .  CALCIUM CARBONATE-VITAMIN D PO, Take 1 tablet by mouth 4 (four) times a week. , Disp: , Rfl:  .  cholecalciferol (VITAMIN D) 1000 units tablet, Take 1,000 Units by mouth 4 (four) times a week. , Disp: , Rfl:  .  cyanocobalamin 1000 MCG tablet, Take 1,000 mcg by mouth 4 (four) times a week. , Disp: , Rfl:  .  donepezil (ARICEPT) 5 MG tablet, TAKE ONE TABLET BY MOUTH AT BEDTIME (Patient taking differently: Take 5 mg by mouth at bedtime. ), Disp: 90 tablet, Rfl: 3 .  lisinopril (PRINIVIL,ZESTRIL) 40 MG tablet, TAKE 1 TABLET BY MOUTH ONCE DAILY (Patient taking differently: Take 40 mg by mouth daily. ), Disp: 90 tablet, Rfl: 3 .  pantoprazole (PROTONIX) 40 MG tablet, Take 1 tablet (40 mg total) by mouth daily., Disp: 30 tablet, Rfl: 3 .  vitamin E 400 UNIT capsule, Take 400 Units by mouth 4 (four) times a week. , Disp: , Rfl:   Review of Systems  Constitutional: Negative.   Respiratory: Negative.   Cardiovascular: Negative.   Musculoskeletal: Positive for arthralgias (left shoulder pain).       Left shoulder pain     Social History   Tobacco Use  . Smoking status: Current Every Day Smoker    Packs/day: 0.50    Years: 40.00    Pack years: 20.00    Types: Cigarettes  . Smokeless tobacco: Never Used  .  Tobacco comment: less than  Substance Use Topics  . Alcohol use: No    Alcohol/week: 0.0 standard drinks      Objective:   BP (!) 146/68 (BP Location: Right Arm, Patient Position: Sitting, Cuff Size: Normal)   Pulse (!) 50   Temp 98.1 F (36.7 C) (Oral)   Wt 94 lb 6.4 oz (42.8 kg)   BMI 19.07 kg/m  Vitals:   10/26/18 1119  BP: (!) 146/68  Pulse: (!) 50  Temp: 98.1 F (36.7 C)  TempSrc: Oral  Weight: 94 lb 6.4 oz (42.8 kg)     Physical Exam Vitals signs reviewed.  Constitutional:      General: She is not in acute distress.    Appearance: Normal appearance.  HENT:     Head: Normocephalic and atraumatic.  Cardiovascular:     Rate and  Rhythm: Normal rate.     Pulses: Normal pulses.  Pulmonary:     Effort: Pulmonary effort is normal. No respiratory distress.  Musculoskeletal:     Comments: L Shoulder:  Inspection reveals no abnormalities, atrophy or asymmetry.  Palpation with tenderness over AC joint  ROM is full in all planes, but she does have pain with overhead motion Rotator cuff strength normal throughout  Skin:    General: Skin is warm and dry.     Capillary Refill: Capillary refill takes less than 2 seconds.     Findings: No rash.  Neurological:     Mental Status: She is alert.     Sensory: No sensory deficit.     Motor: No weakness.  Psychiatric:        Mood and Affect: Mood normal.        Behavior: Behavior normal.         Assessment & Plan   1. Primary osteoarthritis of left shoulder -Chronic and ongoing issue - Notable degenerative changes of AC joint and glenohumeral joint on recent x-ray - Patient has tried conservative measures - Discussed option for corticosteroid injection subacromially to help with pain control and ability to complete ADLs - See procedure note below - Return precautions discussed -Discussed gentle home exercises  Meds ordered this encounter  Medications  . methylPREDNISolone acetate (DEPO-MEDROL) injection 80 mg  . lidocaine-EPINEPHrine (XYLOCAINE W/EPI) 2 %-1:100000 (with pres) injection 4 mL    INJECTION: Patient was given informed consent. Appropriate time out was taken. Area prepped and draped in usual sterile fashion. 2 cc of Depo-Medrol 40 mg/ml plus  4 cc of 2% lidocaine with epinephrine was injected into the left shoulder subacromial space using a(n) posterior approach. The patient tolerated the procedure well, but she did move during procedure several times. There were no complications. Post procedure instructions were given.   Return if symptoms worsen or fail to improve.   The entirety of the information documented in the History of Present Illness,  Review of Systems and Physical Exam were personally obtained by me. Portions of this information were initially documented by Tiburcio Pea, CMA and reviewed by me for thoroughness and accuracy.    Virginia Crews, MD, MPH [facility] 10/26/2018 12:06 PM

## 2018-10-29 ENCOUNTER — Telehealth: Payer: Self-pay

## 2018-11-01 ENCOUNTER — Ambulatory Visit (INDEPENDENT_AMBULATORY_CARE_PROVIDER_SITE_OTHER): Payer: Medicare Other | Admitting: Pharmacist

## 2018-11-01 DIAGNOSIS — G301 Alzheimer's disease with late onset: Secondary | ICD-10-CM

## 2018-11-01 DIAGNOSIS — F0280 Dementia in other diseases classified elsewhere without behavioral disturbance: Secondary | ICD-10-CM | POA: Diagnosis not present

## 2018-11-01 DIAGNOSIS — F0390 Unspecified dementia without behavioral disturbance: Secondary | ICD-10-CM | POA: Diagnosis not present

## 2018-11-01 NOTE — Patient Instructions (Addendum)
[facility] (a senior "day care")   [employer] [employer]:  [place] [address] [place] [facility] ([id number])  [place]  [address] [phone] or toll free [phone]   1. Almyra Free (pharmacist) will follow up with Dr. Rosanna Randy and call by the end of the week regarding donepzil and alendronate 2. Truddie Crumble will reach out to you this week to follow up on insurance coverages for home health  Thank you for allowing the chronic care management team to be a part of your care!  Please call a member of the CCM (Chronic Care Management) Team with any questions or case management needs:   Vanetta Mulders, BSN Nurse Care Coordinator  [phone]  Ruben Reason, PharmD  Clinical Pharmacist  [phone]

## 2018-11-02 NOTE — Chronic Care Management (AMB) (Signed)
Chronic Care Management   Initial Visit Note  11/02/2018 Name: Sarah [name] MRN: [id number] DOB: [birth year]  Referred by: Jerrol Banana., MD Reason for referral : Chronic Care Management (Initial office visit)   Subjective:  "We want Mom to stay at home for as long as possible"  Understanding of regimen: Dementia prevents understanding Understanding of indications: Dementia prevents understanding Potential of compliance: fair patient will take medication if prompted and observed  Objective: Lab Results  Component Value Date   CREATININE 1.81 (H) 06/14/2018   CREATININE 0.9 05/11/2018   CREATININE 1.6 (A) 05/15/2017    No results found for: HGBA1C  Lipid Panel     Component Value Date/Time   CHOL 174 01/12/2017 1205   TRIG 123 01/12/2017 1205   HDL 61 01/12/2017 1205   LDLCALC 88 01/12/2017 1205    BP Readings from Last 3 Encounters:  10/26/18 (!) 146/68  10/12/18 138/82  09/13/18 (!) 142/68    Allergies  Allergen Reactions  . Penicillins Hives and Other (See Comments)    Has patient had a PCN reaction causing immediate rash, facial/tongue/throat swelling, SOB or lightheadedness with hypotension: No Has patient had a PCN reaction causing severe rash involving mucus membranes or skin necrosis: No Has patient had a PCN reaction that required hospitalization: No Has patient had a PCN reaction occurring within the last 10 years: No  If all of the above answers are "NO", then may proceed with Cephalosporin use.     Medications Reviewed Today    Reviewed by Cathi Roan, [facility] (Pharmacist) on 11/01/18 at 1158  Med List Status: <None>  Medication Order Taking? Sig Documenting Provider Last Dose Status Informant  acetaminophen (TYLENOL) 500 MG tablet [id number] Yes Take 500 mg by mouth at bedtime.  [provider] Taking Active Family Member  alendronate (FOSAMAX) 70 MG tablet [id number] Yes Take 1 tablet (70 mg total) by mouth once a week. Take every  Friday. Take with a full glass of water on an empty stomach.  Patient taking differently:  Take 70 mg by mouth every Monday, Tuesday, Wednesday, Thursday, and Friday. Take with a full glass of water on an empty stomach.   Jerrol Banana., MD Taking Active Family Member  amLODipine [facility]) 10 MG tablet [id number] Yes TAKE 1 TABLET BY MOUTH ONCE DAILY Jerrol Banana., MD Taking Active Family Member  aspirin EC 81 MG tablet [id number] Yes Take 81 mg by mouth daily. [provider] Taking Active Family Member  CALCIUM CARBONATE-VITAMIN D PO [id number] Yes Take 1 tablet by mouth 4 (four) times a week.  [provider] Taking Active Family Member           Med Note Rosemarie Beath, MELISSA B   Thu Mar 04, 2018 10:35 AM)    cholecalciferol (VITAMIN D) 1000 units tablet [id number] Yes Take 1,000 Units by mouth 4 (four) times a week.  [provider] Taking Active Family Member           Med Note Rosemarie Beath, MELISSA B   Thu Mar 04, 2018 10:35 AM)    cyanocobalamin 1000 MCG tablet [id number] Yes Take 1,000 mcg by mouth 4 (four) times a week.  [provider] Taking Active Family Member           Med Note Rosemarie Beath, MELISSA B   Thu Mar 04, 2018 10:35 AM)    donepezil (ARICEPT) 5 MG tablet [id number] Yes TAKE ONE TABLET BY MOUTH AT BEDTIME Miguel Aschoff  Kaylyn Lim., MD Taking Active Family Member  lisinopril (PRINIVIL,ZESTRIL) 40 MG tablet [id number] Yes TAKE 1 TABLET BY MOUTH ONCE DAILY Jerrol Banana., MD Taking Active Family Member  pantoprazole (PROTONIX) 40 MG tablet [id number] No Take 1 tablet (40 mg total) by mouth daily.  Patient not taking:  Reported on 11/01/2018   Jerrol Banana., MD Not Taking Active Family Member           Med Note Modena Nunnery, [place] T   Tue Jun 15, 2018 10:46 AM) Not started yet  vitamin E 400 UNIT capsule [id number] Yes Take 400 Units by mouth 4 (four) times a week.  [provider] Taking Active Family Member           Med Note  Rosemarie Beath, MELISSA B   Thu Mar 04, 2018 10:36 AM)           Assessment:   Total Number of meds:  4 (polypharmacy > 10 meds)  Indications for all medications: _0  Yes       _1  No  Adherence Review  _2  Excellent (no doses missed/week)     _3  Good (no more than 1 dose missed/week)     _4  Partial (2-3 doses missed/week)     _5  Poor (>3 doses missed/week)  Intervention  YES NO  Explanation   Unnecessary drug therapy   Alendronate- drug holiday    Possibly no indication X  Omeprazole    Safety/Adverse Med Event      Undesirable side effect _6  _7  Donepezil- diarrhea; grandson reports several episodes incontinent of stool   Adherence      Cannot self-administer medication appropriately _8  _9     Forgets to take _10  _11     Condition refractory to medication _12  _13  Donepezil- progression of dementia   More effective medication available _14  _15     More cost-effective medication available _16  _17     Other pertinent pharmacist  counseling   Reviewed OTC benefit, discussed mail order pharmacy and local pill packaging services;    Goals Addressed            This Visit's Progress   . "We need to simplify things for Mom" (pt-stated)       Pharmacist Clinical Goal(s): Over the next 10 days, CCM pharmacist will collaborate with physician to simplify [name]'s medication regimen   Interventions: . CCM pharmacist reviewed medication list with [name]'s caregivers . Recommend DEXA scan (last scan on file from 2014) . Recommend drug holiday from alendronate (5 years of treatment) . Recommend DC pantoprazole and donepezil  Patient Self Care Activities:  . Per caregiver, patient must be observed taking medications   Please see past updates related to this goal by clicking on the "Past Updates" button in the selected goal       . "We would like for Mom to stay at home as long as possible" (pt-stated)       Clinical Goal(s): Over the next 2 weeks, Mr Pritt and son [name] (caregivers for Mrs.  Almetta Liddicoat) will contact community resources provided to seek more information about community support programs/utilize self care strategies as discussed during today's visit.   Interventions: - offered emotional support and contact information for community support programs who might be of assistance in offering support groups and/or information pertaining to role as caregiver  - provided information regarding home health vs. respite care vs. PACE  - discussed different memory care strategies, including music, word finds, reading (*caregivers noted that  patient needed new eye glasses prescription)   #[facility] [place], [place] [address] [phone]  [place] [address] [phone]  [facility] [address], [place] [address] [phone]   The 36 Hour Day- A book to use for caregivers of those with dementia or Alzheimer's dementia         Plan: Recommendations discussed with provider - DC donepezil, alendronate, omeprazole - due for updated DEXA scan  The caregivers for Ms. Parlin was given information about Chronic Care Management services today including:  1. CCM service includes personalized support from designated clinical staff supervised by her physician, including individualized plan of care and coordination with other care providers 2. 24/7 contact phone numbers for assistance for urgent and routine care needs. 3. Service will only be billed when office clinical staff spend 20 minutes or more in a month to coordinate care. 4. Only one practitioner may furnish and bill the service in a calendar month. 5. The patient may stop CCM services at any time (effective at the end of the month) by phone call to the office staff. 6. The patient will be responsible for cost sharing (co-pay) of up to 20% of the service fee (after annual deductible is met).  Patient  agreed to services and verbal consent obtained.  Ruben Reason, PharmD Clinical Pharmacist [place] [phone]

## 2018-11-04 ENCOUNTER — Telehealth: Payer: Self-pay

## 2018-11-05 ENCOUNTER — Ambulatory Visit: Payer: Self-pay

## 2018-11-05 DIAGNOSIS — G301 Alzheimer's disease with late onset: Secondary | ICD-10-CM | POA: Diagnosis not present

## 2018-11-05 DIAGNOSIS — F0390 Unspecified dementia without behavioral disturbance: Secondary | ICD-10-CM | POA: Diagnosis not present

## 2018-11-05 DIAGNOSIS — F0280 Dementia in other diseases classified elsewhere without behavioral disturbance: Secondary | ICD-10-CM

## 2018-11-05 NOTE — Patient Instructions (Signed)
1. Please continue to utilize resources for dementia care provided to you by the CCM Team. 2. Contact the CCM Team with any additional questions or concerns. Pharmacist will discuss medications with you via telephone call next week.  CCM (Chronic Care Management) Team   Trish Fountain RN, BSN Nurse Care Coordinator  [phone]  Ruben Reason PharmD  Clinical Pharmacist  [phone]  Goals Addressed    . "We would like for Mom to stay at home as long as possible" (pt-stated)       Clinical Goal(s): Over the next 2 weeks, Mr Lukacs and son [name] (caregivers for Mrs. Alexia Dinger) will contact community resources provided to seek more information about community support programs/utilize self care strategies as discussed during today's visit. Goal partially met 11/05/17  Over the next 30 days, Mr Ahn and son [name] (caregivers for [name]) will contact additional community resources provided today for custodial care options for patient related to her progressive dementia  Interventions: - offered emotional support and contact information for community support programs who might be of assistance in offering support groups and/or information pertaining to role as caregiver  - provided information regarding home health vs. respite care vs. PACE  - discussed different memory care strategies, including music, word finds, reading (*caregivers noted that patient needed new eye glasses prescription)  #[facility] [place]  [place], [place] [address] [phone]  [place] [address] [phone]  [facility] [address] [place] [address] [phone]   The 36 Hour Day- A book to use for caregivers of those with dementia or Alzheimer's dementia    11/05/17  Provided Liona Wengert (grandson) with additional resources for dementia care including [facility], Discussed  how to apply for medicaid/long term medicaid if needed, provided emotional support and reassurance, discussed dementia symptoms and barriers to care/ patient self care.  [facility] [place], [place],  [address] [phone]    The patient verbalized understanding of instructions provided today and declined a print copy of patient instruction materials.

## 2018-11-05 NOTE — Chronic Care Management (AMB) (Signed)
  Chronic Care Management   Follow Up Note   11/05/2018 Name: Sarah [name] MRN: [id number] DOB: August 02, 1941  Referred by: Jerrol Banana., MD Reason for referral : Chronic Care Management (incoming call from grandson-dementia care)    Subjective: Per grandson 'We would like to keep her at home as long as we can"   Objective:   Assessment: 78 y.o.year old femalereferred to Chronic Care Management by Dr. Janina Mayo assistance with custodial care options for family secondary to her dementia/Alzheimers. Chronic conditions includeHTN, dementia, depression, HLD. Last office visit withGilbert, Retia Passe., MDwas 10/12/18.  Patient son  Mr Parma and son [name] (caregivers for Sarah [name]) met with CCM Clinic Pharmacist 11/01/2018 in the office and established healthcare goals.  Today Nahara Dona reaches out to [facility] RN CM via telephone for additional resources and to discuss medication changes suggested by [facility] Pharmacist.  Goals Addressed    . "We would like for Mom to stay at home as long as possible" (pt-stated)       Grandson Britainy Kozub has began to explore resources for in home care for Sarah [name]. He has called [facility] an left a message, he plans to follow up again today. He would like to keep Ms. Gerlich at home if possible. She is receiving meals on wheel and able to perform her ADLs. Her son lives in the home but works second shift. Josselyn Harkins, patients grandson also lives I the home but has to travel out of state for school intermittently. They would love to find inhome care for a couple of hours a day or enroll her into an adult daycare.   Clinical Goal(s): Over the next 2 weeks, Mr Smestad and son [name] (caregivers for Sarah [name]) will contact community resources provided to seek more information about community support programs/utilize self care strategies as discussed during today's visit. Goal partially met 11/05/17  Over the next 30 days, Mr  Espinola and son [name] (caregivers for Sarah [name]) will contact additional community resources provided today for custodial care options for patient related to her progressive dementia  Interventions: - offered emotional support and contact information for community support programs who might be of assistance in offering support groups and/or information pertaining to role as caregiver  - provided information regarding home health vs. respite care vs. PACE  - discussed different memory care strategies, including music, word finds, reading (*caregivers noted that patient needed new eye glasses prescription)  #[facility] [place]  [place], [place] [address] [phone]  [place] [address] [phone]  [facility] [address] [place] [address] [phone]   The 36 Hour Day- A book to use for caregivers of those with dementia or Alzheimer's dementia     11/05/17  Provided Sarah [name] (grandson) with additional resources for dementia care including [facility], Discussed how to apply for medicaid/long term medicaid if needed, provided emotional support and reassurance, discussed dementia symptoms and barriers to care/ patient self care.  [facility] [place], [place], [place] [address] [phone]     Plan: Will follow up with Paulla Fore in 4 weeks. Schedule call from [place] to Paulla Fore next week.  Jonathan [place]. Rollene Rotunda, RN, BSN Nurse Care Coordinator [facility] Care Management [phone]

## 2018-11-08 ENCOUNTER — Telehealth: Payer: Self-pay

## 2018-11-08 ENCOUNTER — Ambulatory Visit: Payer: Self-pay | Admitting: Pharmacist

## 2018-11-08 ENCOUNTER — Ambulatory Visit: Payer: Self-pay

## 2018-11-08 DIAGNOSIS — G301 Alzheimer's disease with late onset: Principal | ICD-10-CM

## 2018-11-08 DIAGNOSIS — F0280 Dementia in other diseases classified elsewhere without behavioral disturbance: Secondary | ICD-10-CM

## 2018-11-08 DIAGNOSIS — F0390 Unspecified dementia without behavioral disturbance: Secondary | ICD-10-CM

## 2018-11-08 NOTE — Chronic Care Management (AMB) (Signed)
  Chronic Care Management   Note  11/08/2018 Name: Sarah [name] MRN: [id number] DOB: 03/11/1941  78 y.o. year old female referred to Chronic Care Management by Dr. Johney Maine for caregiver strain and home care options. Chronic conditions include dementia, HTN, osteoporosis, tobacco use . Last office visit with Jerrol Banana., MD was 10/12/18.   Was unable to reach patient's grandson Keerat Denicola (DPR on file) via telephone today and have left HIPAA compliant voicemail asking patient to return my call. (unsuccessful outreach #1).  Outreach today was to discuss medication recommendations discussed with Dr. Miguel Aschoff: DC alendronate, donepezil. Order DEXA scan. Initiate memantine 10mg  BID based on caregiver preference and tolerability to patient.   Plan: Will follow-up within 3-5  business days via telephone.    Ruben Reason, PharmD Clinical Pharmacist [place] [phone]

## 2018-11-10 ENCOUNTER — Ambulatory Visit: Payer: Self-pay | Admitting: Pharmacist

## 2018-11-10 DIAGNOSIS — G301 Alzheimer's disease with late onset: Principal | ICD-10-CM

## 2018-11-10 DIAGNOSIS — F0390 Unspecified dementia without behavioral disturbance: Secondary | ICD-10-CM

## 2018-11-10 DIAGNOSIS — F0280 Dementia in other diseases classified elsewhere without behavioral disturbance: Secondary | ICD-10-CM

## 2018-11-10 DIAGNOSIS — M810 Age-related osteoporosis without current pathological fracture: Secondary | ICD-10-CM

## 2018-11-10 NOTE — Patient Instructions (Signed)
1. Stop taking alendronate 2. You are due for a DEXA (bone density) assessment. The referral coordinator will contact you to schedule an appointment 3. Stop taking donepezil and consider the pros and cons of starting memantine twice daily for dementia related memory loss. Remember there is no cure for dementia, and these medicines serve to slow the progression of the disease.   Thank you for allowing the CCM team to be a part of your care!   Please call a member of the CCM (Chronic Care Management) Team with any questions or case management needs:   Vanetta Mulders, BSN Nurse Care Coordinator  [phone]  Ruben Reason, PharmD  Clinical Pharmacist  [phone]  The patient verbalized understanding of instructions provided today and declined a print copy of patient instruction materials.

## 2018-11-10 NOTE — Chronic Care Management (AMB) (Signed)
Chronic Care Management   Follow Up Note   11/10/2018 Name: Sarah [name] MRN: [id number] DOB: 10-May-1941  Referred by: Jerrol Banana., MD Reason for referral : Chronic Care Management (Medication follow up)    Subjective: Follow up clinical pharmacy phone call for medications    Objective:  Medications Reviewed Today    Reviewed by Cathi Roan, [facility] (Pharmacist) on 11/10/18 at 1218  Med List Status: <None>  Medication Order Taking? Sig Documenting Provider Last Dose Status Informant  acetaminophen (TYLENOL) 500 MG tablet [id number] No Take 500 mg by mouth at bedtime.  [provider] Taking Active Family Member       Patient taking differently:       Discontinued 11/10/18 1217 (Discontinued by provider)   amLODipine (NORVASC) 10 MG tablet [id number] No TAKE 1 TABLET BY MOUTH ONCE DAILY Jerrol Banana., MD Taking Active Family Member  aspirin EC 81 MG tablet [id number] No Take 81 mg by mouth daily. [provider] Taking Active Family Member  CALCIUM CARBONATE-VITAMIN D PO [id number] No Take 1 tablet by mouth 4 (four) times a week.  [provider] Taking Active Family Member           Med Note Rosemarie Beath, MELISSA B   Thu Mar 04, 2018 10:35 AM)    cholecalciferol (VITAMIN D) 1000 units tablet [id number] No Take 1,000 Units by mouth 4 (four) times a week.  [provider] Taking Active Family Member           Med Note Rosemarie Beath, MELISSA B   Thu Mar 04, 2018 10:35 AM)    cyanocobalamin 1000 MCG tablet [id number] No Take 1,000 mcg by mouth 4 (four) times a week.  [provider] Taking Active Family Member           Med Note Liliane Channel Mar 04, 2018 10:35 AM)          Discontinued 11/10/18 1218 (Discontinued by provider)   lisinopril (PRINIVIL,ZESTRIL) 40 MG tablet [id number] No TAKE 1 TABLET BY MOUTH ONCE DAILY Jerrol Banana., MD Taking Active Family Member  pantoprazole (PROTONIX) 40 MG tablet [id number] No Take 1  tablet (40 mg total) by mouth daily.  Patient not taking:  Reported on 11/01/2018   Jerrol Banana., MD Not Taking Active Family Member           Med Note Modena Nunnery, [place] T   Tue Jun 15, 2018 10:46 AM) Not started yet  vitamin E 400 UNIT capsule [id number] No Take 400 Units by mouth 4 (four) times a week.  [provider] Taking Active Family Member           Med Note Rosemarie Beath, MELISSA B   Thu Mar 04, 2018 10:36 AM)             Assessment: Follow up phone call today with patient's grandson [name] (DPR on file). HIPAA identifiers verified.  Purpose of call was to update caregivers on medication recommendations discussed with Dr. Rosanna Randy. It is appropriate at this time to DC alendronate for a drug holiday. Patient is due for a DEXA scan. Caregivers may DC donepezil due to bowel incontinence.   Dr. Rosanna Randy would like caregivers to consider adding memantine to her medication regimen to possibly slow the cognitive decline. The caveat to consider is that memantine is twice daily dosing and caregiver (son Cassandria Santee) is only home to observe once daily dosing. Furthermore, memantine is not a  cure for dementia and will only slow progression. However it is possible that donepezil was helping and memantine would replace this.   Goals Addressed            This Visit's Progress   . "We need to simplify things for Mom" (pt-stated)   On track    Pharmacist Clinical Goal(s): Over the next 10 days, CCM pharmacist will collaborate with physician to simplify Ms. Kukla's medication regimen.    Interventions: - CCM pharmacist updated medication list based  - Discontinue donepezil due to limited efficacy and bowel incontinence - discontinue alendronate for drug holiday  - need to schedule a new DEXA bone density exam (order has been placed) - caregivers will consider the pros and cons of twice daily memantine (Namenda) for dementia   Patient Self Care Activities:  . Per caregiver, patient must be  observed taking medications   Please see past updates related to this goal by clicking on the "Past Updates" button in the selected goal           Plan: Follow up in 2 weeks to determine if caregivers would like for Dr. Rosanna Randy to initiate memantine or proceed with no medication for dementia as her memory decline has progressed.    Ruben Reason, PharmD Clinical Pharmacist [place] [phone]

## 2018-11-12 ENCOUNTER — Other Ambulatory Visit: Payer: Self-pay

## 2018-11-12 DIAGNOSIS — M8580 Other specified disorders of bone density and structure, unspecified site: Secondary | ICD-10-CM

## 2018-11-24 ENCOUNTER — Telehealth: Payer: Medicare Other

## 2018-11-24 ENCOUNTER — Ambulatory Visit: Payer: Self-pay | Admitting: Pharmacist

## 2018-11-24 DIAGNOSIS — G301 Alzheimer's disease with late onset: Principal | ICD-10-CM

## 2018-11-24 DIAGNOSIS — F0280 Dementia in other diseases classified elsewhere without behavioral disturbance: Secondary | ICD-10-CM

## 2018-11-24 DIAGNOSIS — F0390 Unspecified dementia without behavioral disturbance: Secondary | ICD-10-CM

## 2018-11-24 NOTE — Chronic Care Management (AMB) (Signed)
  Chronic Care Management   Note  11/24/2018 Name: Sarah [name] MRN: [id number] DOB: April 16, 1941   78 y.o. year old female referred to Chronic Care Management by Dr. Rosanna Randy for medication management and disease state management related to advancing Alzheimer's.   Initial CCM office visit 10/28/2018. Outreach call placed to determine how trial without donepezil went and to follow up on if memantine was needed or of interest to [name]'s family members.   Was unable to reach patient via telephone today and have left HIPAA compliant voicemail asking patient to return my call. (unsuccessful outreach #1).  Follow up plan: The CM team will reach out to the patient again over the next 7 days.   Ruben Reason, PharmD Clinical Pharmacist [place] [phone]

## 2018-11-26 ENCOUNTER — Telehealth: Payer: Self-pay | Admitting: Family Medicine

## 2018-11-26 DIAGNOSIS — M810 Age-related osteoporosis without current pathological fracture: Secondary | ICD-10-CM

## 2018-11-26 NOTE — Telephone Encounter (Signed)
Pt's insurance company will not cover osteopenia unless it is body part specific.Is there another diagnosis code that I can use ?

## 2018-11-29 NOTE — Telephone Encounter (Signed)
Please review. Thanks!  

## 2018-12-01 ENCOUNTER — Telehealth: Payer: Self-pay

## 2018-12-01 ENCOUNTER — Ambulatory Visit: Payer: Self-pay

## 2018-12-01 DIAGNOSIS — R634 Abnormal weight loss: Secondary | ICD-10-CM

## 2018-12-01 DIAGNOSIS — F0280 Dementia in other diseases classified elsewhere without behavioral disturbance: Secondary | ICD-10-CM

## 2018-12-01 DIAGNOSIS — G301 Alzheimer's disease with late onset: Principal | ICD-10-CM

## 2018-12-01 NOTE — Chronic Care Management (AMB) (Signed)
   Chronic Care Management   Note 12/01/2018 Name: Sarah [name] MRN: [id number] DOB: 09-20-1941   78 y.o.year old femalereferred to Chronic Care Management by Dr. Janina Mayo assistance with custodial care options for family secondary to her dementia/Alzheimers. Chronic conditions includeHTN, dementia, depression, HLD. Last office visit withGilbert, Retia Passe., MDwas 10/12/18.  Patient son  Mr Eustice and son [name] (caregivers for [name]) met with CCM Clinic Pharmacist 11/01/2018 in the office and established healthcare goals.  CCM RN CM has provided [name] custodial care information via telephone.Today the CCM Team (clinic pharmacist and RN case manager) made a joint call to Mr. Brandon Scarbrough and Mr. Graceland Wachter to follow up on goals.     Was unable to reach family via telephone. HIPAA compliant voicemail left with [name] and Cassandria Santee asking to return our call. (unsuccessful outreach #2).   Plan: Will follow-up within 7 business days via telephone.    Nusrat Encarnacion E. Rollene Rotunda, RN, BSN Nurse Care Coordinator [facility] Care Management [phone]

## 2018-12-02 ENCOUNTER — Ambulatory Visit: Payer: Self-pay | Admitting: Pharmacist

## 2018-12-02 DIAGNOSIS — G301 Alzheimer's disease with late onset: Principal | ICD-10-CM

## 2018-12-02 DIAGNOSIS — F0280 Dementia in other diseases classified elsewhere without behavioral disturbance: Secondary | ICD-10-CM

## 2018-12-02 DIAGNOSIS — E46 Unspecified protein-calorie malnutrition: Secondary | ICD-10-CM

## 2018-12-02 DIAGNOSIS — F0390 Unspecified dementia without behavioral disturbance: Secondary | ICD-10-CM

## 2018-12-02 NOTE — Telephone Encounter (Signed)
BMD reordered with corrected diagnosis.

## 2018-12-02 NOTE — Telephone Encounter (Signed)
Yes, Thank you

## 2018-12-02 NOTE — Telephone Encounter (Signed)
Looking back at old BMD she actually has a diagnosis of osteoporosis.  Thank you.

## 2018-12-02 NOTE — Telephone Encounter (Signed)
Sarah Pang, do you need me to reorder BMD? Please let me know. Thanks!

## 2018-12-02 NOTE — Chronic Care Management (AMB) (Signed)
Chronic Care Management   Follow Up Note   12/02/2018 Name: WANA MOUNT MRN: [id number] DOB: 1941/02/01  Referred by: Jerrol Banana., MD Reason for referral : Chronic Care Management (follow up- respite care, medication management)  Subjective:  "Mom is in a lot better place than she was a month or two ago"  HPI: Ms. Giana Castner is a 78 year old female seeing Dr. Miguel Aschoff, MD for primary care. She has advanced dementia. CCM team has previously engaged with son Imagene Boss and grandson Demetrica Zipp (DPR on file). Incoming call (returning outreach) from [place]. HIPAA identifiers verified.  Assessment: #Medication Management:  Cassandria Santee states that Mrs. Sanabia has discontinued donepezil. He and other family members have not noticed any change in her memory or behavior since discontinuation. He reports fewer episodes of incontinence and diarrhea ("She's not running to the bathroom"). He would like a longer trial of no medication for dementia to see if her memory loss and behaviors remain stable before trying memantine.   #Caregiver support: Cassandria Santee reports that Mrs. Rochel has received $500 per month for respite care. Respite care services started this week, the caregiver stays for about 3 hours per day, observes medication administration and does light housekeeping. This has provided him with great peace of mind. He has an appointment for an adult day program on Friday, 12/03/18.    Goals Addressed            This Visit's Progress   . "We need to simplify things for Mom" (pt-stated)   On track    Pharmacist Clinical Goal(s): Over the next 10 days, CCM pharmacist will collaborate with physician to simplify Ms. Duclos's medication regimen.    Interventions: - CCM pharmacist updated medication list based  - Discontinue donepezil due to limited efficacy and bowel incontinence - discontinue alendronate for drug holiday  - need to schedule a new DEXA bone density exam (order has been  placed) - caregivers will consider the pros and cons of twice daily memantine (Namenda) for dementia   Patient Self Care Activities:  . Per caregiver, patient must be observed taking medications   Please see past updates related to this goal by clicking on the "Past Updates" button in the selected goal       . "We would like for Mom to stay at home as long as possible" (pt-stated)   On track    Clinical Goal(s): Over the next 2 weeks, Mr Slagel and son [name] (caregivers for Mrs. Lailee Hoelzel) will contact community resources provided to seek more information about community support programs/utilize self care strategies as discussed during today's visit. Goal partially met 11/05/17, 12/01/18  Over the next 30 days, Mr Kobel and son [name] (caregivers for [name]) will contact additional community resources provided today for custodial care options for patient related to her progressive dementia  Interventions: - offered emotional support and contact information for community support programs who might be of assistance in offering support groups and/or information pertaining to role as caregiver  - provided information regarding home health vs. respite care vs. PACE  - discussed different memory care strategies, including music, word finds, reading (*caregivers noted that patient needed new eye glasses prescription)  #[facility] [place]  [place], [place] [address] [phone]  [place], [place] [address] [phone]  [facility] [address], [place] [address] [phone]  The 36 Hour Day- A book to use for caregivers of those with dementia or Alzheimer's dementia    11/05/17  Provided Prince Olivier (grandson) with additional resources for dementia care including [facility], Discussed how to apply for medicaid/long term medicaid if needed, provided emotional support and  reassurance, discussed dementia symptoms and barriers to care/ patient self care.  12/01/18 Ellamay Fors (son) states that Ms. Amayra Kiedrowski has received $500 for respite care per month, the caregiver started this week. He is meeting with an adult day program on Friday, 12/03/18   [facility] [place], [place], [place] [address] [phone]       Noted patient visit with Dr. Rosanna Randy 12/15/18- will discuss initiation of memantine with Dr. Rosanna Randy at this visit.    Next PCP appointment scheduled for: 12/15/18.   CCM team follow up following 2/19 appointment   Ruben Reason, PharmD Clinical Pharmacist [place] [phone]

## 2018-12-02 NOTE — Patient Instructions (Signed)
Goals Addressed            This Visit's Progress   . "We need to simplify things for Mom" (pt-stated)   On track    Pharmacist Clinical Goal(s): Over the next 10 days, CCM pharmacist will collaborate with physician to simplify Ms. Gasser's medication regimen.    Interventions: - CCM pharmacist updated medication list based  - Discontinue donepezil due to limited efficacy and bowel incontinence - discontinue alendronate for drug holiday  - need to schedule a new DEXA bone density exam (order has been placed) - caregivers will consider the pros and cons of twice daily memantine (Namenda) for dementia   Patient Self Care Activities:  . Per caregiver, patient must be observed taking medications   Please see past updates related to this goal by clicking on the "Past Updates" button in the selected goal       . "We would like for Mom to stay at home as long as possible" (pt-stated)   On track    Clinical Goal(s): Over the next 2 weeks, Mr Whitelaw and son [name] (caregivers for Mrs. Andreah Goheen) will contact community resources provided to seek more information about community support programs/utilize self care strategies as discussed during today's visit. Goal partially met 11/05/17, 12/01/18  Over the next 30 days, Mr Pote and son [name] (caregivers for [name]) will contact additional community resources provided today for custodial care options for patient related to her progressive dementia  Interventions: - offered emotional support and contact information for community support programs who might be of assistance in offering support groups and/or information pertaining to role as caregiver  - provided information regarding home health vs. respite care vs. PACE  - discussed different memory care strategies, including music, word finds, reading (*caregivers noted that patient needed new eye glasses prescription)  #[facility] [place]  [place],  [place] [address] [phone]  [place] [address] [phone]  [facility] [address] [place] [address] [phone]   The 36 Hour Day- A book to use for caregivers of those with dementia or Alzheimer's dementia    11/05/17  Provided Caitlinn Klinker (grandson) with additional resources for dementia care including [facility], Discussed how to apply for medicaid/long term medicaid if needed, provided emotional support and reassurance, discussed dementia symptoms and barriers to care/ patient self care.  12/01/18 Briannah Lona (son) states that Ms. Katina Remick has received $500 for respite care per month, the caregiver started this week. He is meeting with an adult day program on Friday, 12/03/18   [facility] [place], [place], [place] [address] [phone]        Thank you allowing the Chronic Care Management Team to be a part of your care!  .Please call a member of the CCM (Chronic Care Management) Team with any questions or case management needs:   Vanetta Mulders, BSN Nurse Care Coordinator  [phone]  Ruben Reason, PharmD  Clinical Pharmacist  [phone]  The patient verbalized understanding of instructions provided today and declined a print copy of patient instruction materials.

## 2018-12-06 ENCOUNTER — Telehealth: Payer: Self-pay

## 2018-12-15 ENCOUNTER — Other Ambulatory Visit: Payer: Self-pay

## 2018-12-15 ENCOUNTER — Ambulatory Visit (INDEPENDENT_AMBULATORY_CARE_PROVIDER_SITE_OTHER): Payer: Medicare Other | Admitting: Family Medicine

## 2018-12-15 ENCOUNTER — Ambulatory Visit: Payer: Self-pay | Admitting: Family Medicine

## 2018-12-15 ENCOUNTER — Encounter: Payer: Self-pay | Admitting: Family Medicine

## 2018-12-15 VITALS — BP 132/58 | HR 77 | Temp 98.1°F | Ht <= 58 in | Wt 94.6 lb

## 2018-12-15 DIAGNOSIS — G301 Alzheimer's disease with late onset: Secondary | ICD-10-CM | POA: Diagnosis not present

## 2018-12-15 DIAGNOSIS — R634 Abnormal weight loss: Secondary | ICD-10-CM

## 2018-12-15 DIAGNOSIS — F17200 Nicotine dependence, unspecified, uncomplicated: Secondary | ICD-10-CM | POA: Diagnosis not present

## 2018-12-15 DIAGNOSIS — F0280 Dementia in other diseases classified elsewhere without behavioral disturbance: Secondary | ICD-10-CM

## 2018-12-15 DIAGNOSIS — M1990 Unspecified osteoarthritis, unspecified site: Secondary | ICD-10-CM | POA: Diagnosis not present

## 2018-12-15 NOTE — Progress Notes (Signed)
Patient: Sarah [name] Female    DOB: 1941-08-13   78 y.o.   MRN: [id number] Visit Date: 12/15/2018  Today's Provider: Wilhemena Durie, MD   Chief Complaint  Patient presents with  . Hypertension    3 month fup   Subjective:     HPI  Pt reports she has been having balance problems in the last few days but this has been going on for years.  She reports no falls.  Hypertension, follow-up:  BP Readings from Last 3 Encounters:  12/15/18 (!) 132/58  10/26/18 (!) 146/68  10/12/18 138/82    She was last seen for hypertension 3 months ago.  BP at that visit was 142/68 Management since that visit includes no changes. She reports good compliance with treatment. She is not having side effects.  She is exercising pt is pretty active with doing things around the house. She is not adherent to low salt diet.   Outside blood pressures are not being done at home. She is experiencing none.  Patient denies none.   Cardiovascular risk factors include hypertension.  Use of agents associated with hypertension: none.     Weight trend: stable Wt Readings from Last 3 Encounters:  12/15/18 94 lb 9.6 oz (42.9 kg)  10/26/18 94 lb 6.4 oz (42.8 kg)  10/12/18 95 lb (43.1 kg)    Current diet: well balanced  ------------------------------------------------------------------------   Allergies  Allergen Reactions  . Penicillins Hives and Other (See Comments)    Has patient had a PCN reaction causing immediate rash, facial/tongue/throat swelling, SOB or lightheadedness with hypotension: No Has patient had a PCN reaction causing severe rash involving mucus membranes or skin necrosis: No Has patient had a PCN reaction that required hospitalization: No Has patient had a PCN reaction occurring within the last 10 years: No  If all of the above answers are "NO", then may proceed with Cephalosporin use.      Current Outpatient Medications:  .  acetaminophen (TYLENOL) 500 MG tablet,  Take 500 mg by mouth at bedtime. , Disp: , Rfl:  .  amLODipine (NORVASC) 10 MG tablet, TAKE 1 TABLET BY MOUTH ONCE DAILY, Disp: 90 tablet, Rfl: 3 .  aspirin EC 81 MG tablet, Take 81 mg by mouth daily., Disp: , Rfl:  .  CALCIUM CARBONATE-VITAMIN D PO, Take 1 tablet by mouth 4 (four) times a week. , Disp: , Rfl:  .  cholecalciferol (VITAMIN D) 1000 units tablet, Take 1,000 Units by mouth 4 (four) times a week. , Disp: , Rfl:  .  cyanocobalamin 1000 MCG tablet, Take 1,000 mcg by mouth 4 (four) times a week. , Disp: , Rfl:  .  lisinopril (PRINIVIL,ZESTRIL) 40 MG tablet, TAKE 1 TABLET BY MOUTH ONCE DAILY, Disp: 90 tablet, Rfl: 3 .  vitamin E 400 UNIT capsule, Take 400 Units by mouth 4 (four) times a week. , Disp: , Rfl:  .  pantoprazole (PROTONIX) 40 MG tablet, Take 1 tablet (40 mg total) by mouth daily. (Patient not taking: Reported on 11/01/2018), Disp: 30 tablet, Rfl: 3  Review of Systems  Constitutional: Negative.   HENT: Negative.   Eyes: Negative.   Respiratory: Negative.   Cardiovascular: Negative.   Gastrointestinal: Negative.   Endocrine: Negative.   Genitourinary: Negative.   Musculoskeletal: Negative.   Skin: Negative.   Allergic/Immunologic: Negative.   Neurological: Positive for dizziness.  Hematological: Negative.   Psychiatric/Behavioral: Negative.     Social History   Tobacco Use  .  Smoking status: Current Every Day Smoker    Packs/day: 0.50    Years: 40.00    Pack years: 20.00    Types: Cigarettes  . Smokeless tobacco: Never Used  . Tobacco comment: less than  Substance Use Topics  . Alcohol use: No    Alcohol/week: 0.0 standard drinks      Objective:   BP (!) 132/58 (BP Location: Right Arm, Patient Position: Sitting, Cuff Size: Normal)   Pulse 77   Temp 98.1 F (36.7 C) (Oral)   Ht 4\' 8"  (1.422 m)   Wt 94 lb 9.6 oz (42.9 kg)   SpO2 94%   BMI 21.21 kg/m  Vitals:   12/15/18 1009  BP: (!) 132/58  Pulse: 77  Temp: 98.1 F (36.7 C)  TempSrc: Oral    SpO2: 94%  Weight: 94 lb 9.6 oz (42.9 kg)  Height: 4\' 8"  (1.422 m)     Physical Exam Vitals signs reviewed.  Constitutional:      Appearance: She is well-developed.  HENT:     Head: Normocephalic and atraumatic.     Right Ear: External ear normal.     Left Ear: External ear normal.     Nose: Nose normal.  Eyes:     General: No scleral icterus.    Conjunctiva/sclera: Conjunctivae normal.  Neck:     Thyroid: No thyromegaly.  Cardiovascular:     Rate and Rhythm: Normal rate and regular rhythm.     Heart sounds: Normal heart sounds.  Pulmonary:     Effort: Pulmonary effort is normal.     Breath sounds: Normal breath sounds.  Abdominal:     Palpations: Abdomen is soft.  Musculoskeletal:        General: No swelling.  Skin:    General: Skin is warm and dry.  Neurological:     General: No focal deficit present.     Mental Status: She is alert. Mental status is at baseline.  Psychiatric:        Mood and Affect: Mood normal.        Behavior: Behavior normal.        Thought Content: Thought content normal.        Judgment: Judgment normal.    MMSE is 10/30 today     Assessment & Plan    1. Late onset Alzheimer's disease without behavioral disturbance ([place]) .  Moderately severe now.  And progressive.  I am not sure how much longer she can continue to live at home.  May require memory care unit.  Return in 4 months.  More than 50% of 25-minute visit is spent in counseling and coordination of care.  2. Osteoarthritis, unspecified osteoarthritis type, unspecified site Are improved after steroid injection.  3. Smokes tobacco daily Have been asking  patient to quit for years.  4. Weight loss At her this.  Have discussed this with patient and especially son and grandson.  They wish no aggressive work-up for this.  I think it is related to the dementia.     Sarah [name] Mon, MD  [facility]

## 2018-12-20 ENCOUNTER — Telehealth: Payer: Self-pay

## 2018-12-20 ENCOUNTER — Encounter: Payer: Self-pay | Admitting: Family Medicine

## 2018-12-20 ENCOUNTER — Ambulatory Visit: Payer: Self-pay | Admitting: Pharmacist

## 2018-12-20 DIAGNOSIS — F0390 Unspecified dementia without behavioral disturbance: Secondary | ICD-10-CM

## 2018-12-20 DIAGNOSIS — G301 Alzheimer's disease with late onset: Principal | ICD-10-CM

## 2018-12-20 DIAGNOSIS — I129 Hypertensive chronic kidney disease with stage 1 through stage 4 chronic kidney disease, or unspecified chronic kidney disease: Secondary | ICD-10-CM | POA: Diagnosis not present

## 2018-12-20 DIAGNOSIS — F0280 Dementia in other diseases classified elsewhere without behavioral disturbance: Secondary | ICD-10-CM

## 2018-12-20 DIAGNOSIS — N183 Chronic kidney disease, stage 3 (moderate): Secondary | ICD-10-CM | POA: Diagnosis not present

## 2018-12-20 NOTE — Chronic Care Management (AMB) (Signed)
  Chronic Care Management   Note  12/20/2018 Name: Sarah [name] MRN: [id number] DOB: 02/18/1941  78 y.o. year old female referred to Chronic Care Management by Dr. Miguel Aschoff for caregiver strain related to dementia, medication management. Last office visit with Jerrol Banana., MD was 12/15/18. Patient's son and grandson (DPR on file) have been engaged with CCM team for assistance with dementia related caregiver strain, memory care resources, and medication management.   Joint outreach with CCM pharmacist and CCM RNCM today. Was unable to reach patient's son Cassandria Santee via telephone today and have left HIPAA compliant voicemail asking him to return my call. (unsuccessful outreach #1).  Ruben Reason, PharmD Clinical Pharmacist [place] [phone]

## 2018-12-22 ENCOUNTER — Telehealth: Payer: Self-pay

## 2018-12-22 ENCOUNTER — Ambulatory Visit: Payer: Self-pay

## 2018-12-22 DIAGNOSIS — F0390 Unspecified dementia without behavioral disturbance: Secondary | ICD-10-CM

## 2018-12-22 DIAGNOSIS — F0280 Dementia in other diseases classified elsewhere without behavioral disturbance: Secondary | ICD-10-CM

## 2018-12-22 DIAGNOSIS — G301 Alzheimer's disease with late onset: Secondary | ICD-10-CM

## 2018-12-22 NOTE — Chronic Care Management (AMB) (Signed)
  Chronic Care Management   Note  12/22/2018 Name: BRIANY AYE MRN: [id number] DOB: [birth year]  78 y.o. year old female referred to Chronic Care Management by Dr. Miguel Aschoff for caregiver strain related to dementia, medication management. Last office visit with Jerrol Banana., MD was 12/15/18. Patient's son and grandson (DPR on file) have been engaged with CCM team for assistance with dementia related caregiver strain, memory care resources, and medication management.   Joint outreach with CCM pharmacist and CCM RNCM today. Was unable to reach patient's grandson [name] via telephone today. Unfortunately both given numbers for [name] did not have an option to leave VM message as phones continued to ring without VM pick-up (unsuccessful outreach # 2). CCM Team has previously been unsuccessful contacting patients son [place]. Hipaa compliant VM messages have been left for [place] without return.   The CM team will reach out to the patient again over the next 7 days.    Jordane Hisle E. Rollene Rotunda, RN, BSN Nurse Care Coordinator [facility] Practice/[facility] Care Management [phone]

## 2018-12-27 ENCOUNTER — Telehealth: Payer: Self-pay

## 2018-12-27 ENCOUNTER — Ambulatory Visit: Payer: Self-pay

## 2018-12-27 DIAGNOSIS — F0390 Unspecified dementia without behavioral disturbance: Secondary | ICD-10-CM

## 2018-12-27 DIAGNOSIS — F0280 Dementia in other diseases classified elsewhere without behavioral disturbance: Secondary | ICD-10-CM

## 2018-12-27 DIAGNOSIS — G301 Alzheimer's disease with late onset: Secondary | ICD-10-CM

## 2018-12-27 NOTE — Chronic Care Management (AMB) (Signed)
  Chronic Care Management   Note  12/27/2018 Name: OLUWASEUN BRUYERE MRN: [id number] DOB: Sep 05, 1941   78 y.o.year old femalereferred to Chronic Care Management by Dr. Natale Milch caregiver strain related to dementia, medication management. Last office visit withGilbert, Retia Passe., MDwas 12/15/18.Patient's son and grandson (DPR on file) have been engaged with CCM team for assistance with dementia related caregiver strain, memory care resources, and medication management.   Joint outreach with CCM pharmacist and CCM RNCM today.Was unable to reach patient's son [place] telephone today. Multiple outreach attempts have been made by CCM RN CM and Clinic Pharmacist unsuccessfully. Today completes #3 unsuccessful outreach attempt. Patient's family  has been provided CCM contact information if he/she wishes to engage with care managers in the future. CCM Team will suspend all outreach calls at this time.  Donovyn Guidice E. Rollene Rotunda, RN, BSN Nurse Care Coordinator [facility] Practice/[facility] Care Management [phone]

## 2018-12-28 ENCOUNTER — Telehealth: Payer: Self-pay | Admitting: Family Medicine

## 2018-12-28 NOTE — Telephone Encounter (Signed)
Bone density scheduled at [place] 01/12/19 at 11:30

## 2018-12-29 ENCOUNTER — Encounter: Payer: Self-pay | Admitting: Family Medicine

## 2018-12-29 ENCOUNTER — Ambulatory Visit (INDEPENDENT_AMBULATORY_CARE_PROVIDER_SITE_OTHER): Payer: Medicare Other | Admitting: Family Medicine

## 2018-12-29 VITALS — BP 124/71 | HR 64 | Temp 98.3°F | Resp 16 | Wt 93.0 lb

## 2018-12-29 DIAGNOSIS — Z111 Encounter for screening for respiratory tuberculosis: Secondary | ICD-10-CM

## 2018-12-29 DIAGNOSIS — F0280 Dementia in other diseases classified elsewhere without behavioral disturbance: Secondary | ICD-10-CM

## 2018-12-29 DIAGNOSIS — E440 Moderate protein-calorie malnutrition: Secondary | ICD-10-CM | POA: Diagnosis not present

## 2018-12-29 DIAGNOSIS — I150 Renovascular hypertension: Secondary | ICD-10-CM

## 2018-12-29 DIAGNOSIS — G301 Alzheimer's disease with late onset: Secondary | ICD-10-CM

## 2018-12-29 NOTE — Progress Notes (Signed)
Patient: Sarah [name] Female    DOB: February 18, 1941   78 y.o.   MRN: [id number] Visit Date: 12/29/2018  Today's Provider: Wilhemena Durie, MD   Chief Complaint  Patient presents with  . needs forms filled out   Subjective:     HPI  Patient comes in today needing forms filled out to enroll in a Day program for adults. She feels well today with no other complaints.  Her son brings her in.  She is very pleasantly demented.  She eats about 1 meal a day now.  She has no complaints today and is very talkative. Allergies  Allergen Reactions  . Penicillins Hives and Other (See Comments)    Has patient had a PCN reaction causing immediate rash, facial/tongue/throat swelling, SOB or lightheadedness with hypotension: No Has patient had a PCN reaction causing severe rash involving mucus membranes or skin necrosis: No Has patient had a PCN reaction that required hospitalization: No Has patient had a PCN reaction occurring within the last 10 years: No  If all of the above answers are "NO", then may proceed with Cephalosporin use.      Current Outpatient Medications:  .  acetaminophen (TYLENOL) 500 MG tablet, Take 500 mg by mouth at bedtime. , Disp: , Rfl:  .  amLODipine (NORVASC) 10 MG tablet, TAKE 1 TABLET BY MOUTH ONCE DAILY, Disp: 90 tablet, Rfl: 3 .  aspirin EC 81 MG tablet, Take 81 mg by mouth daily., Disp: , Rfl:  .  CALCIUM CARBONATE-VITAMIN D PO, Take 1 tablet by mouth 4 (four) times a week. , Disp: , Rfl:  .  cholecalciferol (VITAMIN D) 1000 units tablet, Take 1,000 Units by mouth 4 (four) times a week. , Disp: , Rfl:  .  cyanocobalamin 1000 MCG tablet, Take 1,000 mcg by mouth 4 (four) times a week. , Disp: , Rfl:  .  lisinopril (PRINIVIL,ZESTRIL) 40 MG tablet, TAKE 1 TABLET BY MOUTH ONCE DAILY, Disp: 90 tablet, Rfl: 3 .  vitamin E 400 UNIT capsule, Take 400 Units by mouth 4 (four) times a week. , Disp: , Rfl:  .  pantoprazole (PROTONIX) 40 MG tablet, Take 1 tablet (40 mg  total) by mouth daily. (Patient not taking: Reported on 11/01/2018), Disp: 30 tablet, Rfl: 3  Review of Systems  Constitutional: Negative for activity change, appetite change and chills.  Eyes: Negative.   Respiratory: Negative for cough and shortness of breath.   Cardiovascular: Negative for chest pain, palpitations and leg swelling.  Gastrointestinal: Negative.   Endocrine: Positive for cold intolerance.  Musculoskeletal: Positive for arthralgias.  Allergic/Immunologic: Negative.   Neurological: Negative for dizziness and speech difficulty.  Psychiatric/Behavioral: Positive for decreased concentration. Negative for agitation, behavioral problems, self-injury and suicidal ideas. The patient is not nervous/anxious and is not hyperactive.     Social History   Tobacco Use  . Smoking status: Current Every Day Smoker    Packs/day: 0.50    Years: 40.00    Pack years: 20.00    Types: Cigarettes  . Smokeless tobacco: Never Used  . Tobacco comment: less than  Substance Use Topics  . Alcohol use: No    Alcohol/week: 0.0 standard drinks      Objective:   BP 124/71 (BP Location: Right Arm, Patient Position: Sitting, Cuff Size: Normal)   Pulse 64   Temp 98.3 F (36.8 C)   Resp 16   Wt 93 lb (42.2 kg)   BMI 20.85 kg/m  Vitals:  12/29/18 1147  BP: 124/71  Pulse: 64  Resp: 16  Temp: 98.3 F (36.8 C)  Weight: 93 lb (42.2 kg)     Physical Exam Vitals signs reviewed.  Constitutional:      Appearance: She is well-developed.  HENT:     Head: Normocephalic and atraumatic.     Right Ear: External ear normal.     Left Ear: External ear normal.     Nose: Nose normal.  Eyes:     General: No scleral icterus.    Conjunctiva/sclera: Conjunctivae normal.  Neck:     Thyroid: No thyromegaly.  Cardiovascular:     Rate and Rhythm: Normal rate and regular rhythm.     Heart sounds: Normal heart sounds.  Pulmonary:     Effort: Pulmonary effort is normal.     Breath sounds: Normal  breath sounds.  Abdominal:     Palpations: Abdomen is soft.  Musculoskeletal:        General: No swelling.  Skin:    General: Skin is warm and dry.  Neurological:     General: No focal deficit present.     Mental Status: She is alert. Mental status is at baseline.  Psychiatric:        Mood and Affect: Mood normal.        Behavior: Behavior normal.         Assessment & Plan    1. Late onset Alzheimer's disease without behavioral disturbance ([place]) Form filled out for adult daycare.  QuantiFERON gold ordered for TB testing  2. Hypertension, renovascular Controlled  3. Screening-pulmonary TB  - QuantiFERON-TB Gold Plus  4. Malnutrition of moderate degree (HCC) Encourage any calories patient will intake.    I have done the exam and reviewed the above chart and it is accurate to the best of my knowledge. Development worker, community has been used in this note in any air is in the dictation or transcription are unintentional.  Wilhemena Durie, MD  [facility]

## 2018-12-31 LAB — QUANTIFERON-TB GOLD PLUS
QUANTIFERON TB2 AG VALUE: 0.01 [IU]/mL
QUANTIFERON-TB GOLD PLUS: NEGATIVE
QuantiFERON Mitogen Value: >10 IU/mL
QuantiFERON Nil Value: 0.01 IU/mL
QuantiFERON TB1 Ag Value: 0.01 IU/mL

## 2019-01-04 ENCOUNTER — Telehealth: Payer: Self-pay | Admitting: Family Medicine

## 2019-01-04 ENCOUNTER — Telehealth: Payer: Self-pay

## 2019-01-04 NOTE — Telephone Encounter (Signed)
Advised grandson as below.

## 2019-01-04 NOTE — Telephone Encounter (Signed)
Advised grandson.

## 2019-01-04 NOTE — Telephone Encounter (Signed)
-----   Message from Jerrol Banana., MD sent at 01/03/2019  1:54 PM EDT ----- TB test negative.

## 2019-01-04 NOTE — Telephone Encounter (Signed)
Advised patient of results.  

## 2019-01-04 NOTE — Telephone Encounter (Signed)
Pt's grandson called saying they dropped off some paperwork for pt to be enrolled adult day care.  He wants to know when they will be ready to be picked up  CB#  [phone]  Thank Con Memos

## 2019-01-04 NOTE — Telephone Encounter (Signed)
Left message to call back. Forms are also ready for pick up.

## 2019-03-24 ENCOUNTER — Ambulatory Visit (INDEPENDENT_AMBULATORY_CARE_PROVIDER_SITE_OTHER): Payer: Medicare Other | Admitting: Nurse Practitioner

## 2019-03-24 ENCOUNTER — Ambulatory Visit (INDEPENDENT_AMBULATORY_CARE_PROVIDER_SITE_OTHER): Payer: Medicare Other

## 2019-03-24 ENCOUNTER — Encounter (INDEPENDENT_AMBULATORY_CARE_PROVIDER_SITE_OTHER): Payer: Self-pay | Admitting: Vascular Surgery

## 2019-03-24 ENCOUNTER — Other Ambulatory Visit: Payer: Self-pay

## 2019-03-24 VITALS — BP 176/74 | HR 50 | Resp 16 | Wt 97.0 lb

## 2019-03-24 DIAGNOSIS — I701 Atherosclerosis of renal artery: Secondary | ICD-10-CM | POA: Diagnosis not present

## 2019-03-24 DIAGNOSIS — Z79899 Other long term (current) drug therapy: Secondary | ICD-10-CM

## 2019-03-24 DIAGNOSIS — F17200 Nicotine dependence, unspecified, uncomplicated: Secondary | ICD-10-CM

## 2019-03-24 DIAGNOSIS — K551 Chronic vascular disorders of intestine: Secondary | ICD-10-CM | POA: Diagnosis not present

## 2019-03-24 DIAGNOSIS — E785 Hyperlipidemia, unspecified: Secondary | ICD-10-CM | POA: Diagnosis not present

## 2019-03-24 DIAGNOSIS — F17210 Nicotine dependence, cigarettes, uncomplicated: Secondary | ICD-10-CM

## 2019-03-24 DIAGNOSIS — G301 Alzheimer's disease with late onset: Secondary | ICD-10-CM | POA: Diagnosis not present

## 2019-03-24 DIAGNOSIS — F0280 Dementia in other diseases classified elsewhere without behavioral disturbance: Secondary | ICD-10-CM

## 2019-03-24 NOTE — Progress Notes (Signed)
SUBJECTIVE:  Patient ID: Sarah [name], female    DOB: Apr 10, 1941, 78 y.o.   MRN: [id number] Chief Complaint  Patient presents with  . Follow-up    ultrasound follow up    HPI  Sarah [name] is a 78 y.o. female to the office for noninvasive studies regarding renal artery stenosis.  Patient has had a previous renal artery stent placed 11/21/2014 in the right renal artery.  The patient has advanced Alzheimer's therefore she is not a good historian however her grandson is present with her today and is able to give insight.  He states that her blood pressure is generally good except today it is a little bit elevated however it is worth noting that she has not taken her medicines today.  The patient continues to smoke on a daily basis.  He denies any nausea or vomiting after meals.  He denies any food aversion or complaints of abdominal pain.  He states that she generally eats well.  The patient is also complaining of hunger this morning.  She denies any fevers, chills, nausea, vomiting or diarrhea.  Today the patient underwent noninvasive studies today which reveals the patient has a patent right renal artery stent with stenosis of 1 to 59%.  No significant changes based on the previous exam on 03/18/2018.  The left renal artery has no evidence of stenosis also consistent with previous studies.  The mesenteric arteries have a 70 to 99% stenosis noted also consistent with previous studies.  A known distal aortic aneurysm was noted consistent with previous sizes.  It is also worth noting that there is a right renal cyst noted at the superior border.  Past Medical History:  Diagnosis Date  . Arthritis   . Dementia ([place])    ALZHEIMERS  . Ear anomaly    INNER EAR ISSUES  . Hyperlipidemia   . Hypertension     Past Surgical History:  Procedure Laterality Date  . APPENDECTOMY    . CATARACT EXTRACTION W/PHACO Right 03/09/2018   Procedure: CATARACT EXTRACTION PHACO AND INTRAOCULAR LENS PLACEMENT  (IOC);  Surgeon: Birder Robson, MD;  Location: [facility] ORS;  Service: Ophthalmology;  Laterality: Right;  [place] 00:38.9 AP% 15.9 CDE 6.19 FLUID PACK LOT # [id number]  . CATARACT EXTRACTION W/PHACO Left 06/22/2018   Procedure: CATARACT EXTRACTION PHACO AND INTRAOCULAR LENS PLACEMENT (IOC);  Surgeon: Birder Robson, MD;  Location: [facility] ORS;  Service: Ophthalmology;  Laterality: Left;  [place] 00:50 AP% 16.5 CDE 8.25 Fluid pack lot # [id number]  . CESAREAN SECTION    . CHOLECYSTECTOMY    . CYSTOSCOPY WITH STENT PLACEMENT      Social History   Socioeconomic History  . Marital status: Widowed    Spouse name: Not on file  . Number of children: 2  . Years of education: Not on file  . Highest education level: High school graduate  Occupational History  . Occupation: retired  Scientific laboratory technician  . Financial resource strain: Not hard at all  . Food insecurity:    Worry: Never true    Inability: Not on file  . Transportation needs:    Medical: No    Non-medical: No  Tobacco Use  . Smoking status: Current Every Day Smoker    Packs/day: 0.50    Years: 40.00    Pack years: 20.00    Types: Cigarettes  . Smokeless tobacco: Never Used  . Tobacco comment: less than  Substance and Sexual Activity  . Alcohol use: No  Alcohol/week: 0.0 standard drinks  . Drug use: No  . Sexual activity: Not on file  Lifestyle  . Physical activity:    Days per week: Not on file    Minutes per session: Not on file  . Stress: Not at all  Relationships  . Social connections:    Talks on phone: Not on file    Gets together: Not on file    Attends religious service: Not on file    Active member of club or organization: Not on file    Attends meetings of clubs or organizations: Not on file    Relationship status: Not on file  . Intimate partner violence:    Fear of current or ex partner: Not on file    Emotionally abused: Not on file    Physically abused: Not on file    Forced sexual activity: Not on file   Other Topics Concern  . Not on file  Social History Narrative  . Not on file    Family History  Problem Relation Age of Onset  . Colon cancer Father   . Pancreatic cancer Sister   . GER disease Son   . Alzheimer's disease Sister     Allergies  Allergen Reactions  . Penicillins Hives and Other (See Comments)    Has patient had a PCN reaction causing immediate rash, facial/tongue/throat swelling, SOB or lightheadedness with hypotension: No Has patient had a PCN reaction causing severe rash involving mucus membranes or skin necrosis: No Has patient had a PCN reaction that required hospitalization: No Has patient had a PCN reaction occurring within the last 10 years: No  If all of the above answers are "NO", then may proceed with Cephalosporin use.      Review of Systems   Review of Systems: Negative Unless Checked Constitutional: [] Weight loss  [] Fever  [] Chills Cardiac: [] Chest pain   []  Atrial Fibrillation  [] Palpitations   [] Shortness of breath when laying flat   [] Shortness of breath with exertion. [] Shortness of breath at rest Vascular:  [] Pain in legs with walking   [] Pain in legs with standing [] Pain in legs when laying flat   [] Claudication    [] Pain in feet when laying flat    [] History of DVT   [] Phlebitis   [] Swelling in legs   [] Varicose veins   [] Non-healing ulcers Pulmonary:   [] Uses home oxygen   [] Productive cough   [] Hemoptysis   [] Wheeze  [] COPD   [] Asthma Neurologic:  [] Dizziness   [] Seizures  [] Blackouts [] History of stroke   [] History of TIA  [] Aphasia   [] Temporary Blindness   [] Weakness or numbness in arm   [] Weakness or numbness in leg Musculoskeletal:   [] Joint swelling   [] Joint pain   [] Low back pain  []  History of Knee Replacement [] Arthritis [] back Surgeries  []  Spinal Stenosis    Hematologic:  [] Easy bruising  [] Easy bleeding   [] Hypercoagulable state   [] Anemic Gastrointestinal:  [] Diarrhea   [] Vomiting  [x] Gastroesophageal reflux/heartburn    [] Difficulty swallowing. [] Abdominal pain Genitourinary:  [] Chronic kidney disease   [] Difficult urination  [] Anuric   [] Blood in urine [] Frequent urination  [] Burning with urination   [] Hematuria Skin:  [] Rashes   [] Ulcers [] Wounds Psychological:  [] History of anxiety   []  History of major depression  []  Memory Difficulties      OBJECTIVE:   Physical Exam  BP (!) 176/74 (BP Location: Right Arm)   Pulse (!) 50   Resp 16   Wt 97 lb (44  kg)   BMI 21.75 kg/m   Gen: WD/WN, NAD Head: [place]/AT, No temporalis wasting.  Ear/Nose/Throat: Hearing grossly intact, nares w/o erythema or drainage Eyes: PER, EOMI, sclera nonicteric.  Neck: Supple, no masses.  No JVD.  Pulmonary:  Good air movement, no use of accessory muscles.  Cardiac: RRR Vascular:  Vessel Right Left  Radial Palpable Palpable   Gastrointestinal: soft, non-distended. No guarding/no peritoneal signs.  Musculoskeletal: M/S 5/5 throughout.  No deformity or atrophy.  Neurologic: Pain and light touch intact in extremities.  Symmetrical.  Speech is fluent. Motor exam as listed above. Psychiatric: Judgment intact, Mood & affect appropriate for pt's clinical situation. Dermatologic: No Venous rashes. No Ulcers Noted.  No changes consistent with cellulitis. Lymph : No Cervical lymphadenopathy, no lichenification or skin changes of chronic lymphedema.       ASSESSMENT AND PLAN:  1. Renal artery stenosis (HCC) Today the patient underwent noninvasive studies today which reveals the patient has a patent right renal artery stent with stenosis of 1 to 59%.  No significant changes based on the previous exam on 03/18/2018.  The left renal artery has no evidence of stenosis also consistent with previous studies.  The mesenteric arteries have a 70 to 99% stenosis noted also consistent with previous studies.  A known distal aortic aneurysm was noted consistent with previous sizes.  It is also worth noting that there is a right renal cyst noted at  the superior border.  Today the patient's blood pressure was slightly elevated however this is not a normal occurrence.  The patient also has not had her medication this morning prior to her scans.  Family is advised that if her blood pressure is not under good control despite adjustments by her PCP they should contact us for follow-up evaluation.  Also I was unable to find mention of a previous noted of the renal cyst.  I have advised the family to follow-up with the PCP to determine if they would like further follow-up studies or if they are comfortable with [place] following with the annual ultrasound. - VAS [place] MESENTERIC; Future  2. Hyperlipidemia, unspecified hyperlipidemia type Continue statin as ordered and reviewed, no changes at this time   3. Late onset Alzheimer's disease without behavioral disturbance ([place]) Patient is a poor historian however her grandson is there to provide history and insight.  We will continue current medical therapy.  4. Smokes tobacco daily Smoking cessation was discussed, 3-10 minutes spent on this topic specifically   5. Mesenteric artery stenosis (HCC) Currently the patient has no manifestation of mesenteric artery stenosis.  Given her age as well as dementia it is felt that it is better to proceed on a more cautious approach.  Family is counseled that should she develop food aversion, nausea or vomiting after meals, pain after meals, or just complaints of generalized stomachaches they should contact her office for reevaluation sooner than 1 year.   Current Outpatient Medications on File Prior to Visit  Medication Sig Dispense Refill  . acetaminophen (TYLENOL) 500 MG tablet Take 500 mg by mouth at bedtime.     Marland Kitchen amLODipine (NORVASC) 10 MG tablet TAKE 1 TABLET BY MOUTH ONCE DAILY 90 tablet 3  . aspirin EC 81 MG tablet Take 81 mg by mouth daily.    Marland Kitchen CALCIUM CARBONATE-VITAMIN D PO Take 1 tablet by mouth 4 (four) times a week.     . cholecalciferol (VITAMIN D)  1000 units tablet Take 1,000 Units by mouth 4 (four) times  a week.     . cyanocobalamin 1000 MCG tablet Take 1,000 mcg by mouth 4 (four) times a week.     Marland Kitchen lisinopril (PRINIVIL,ZESTRIL) 40 MG tablet TAKE 1 TABLET BY MOUTH ONCE DAILY 90 tablet 3  . vitamin E 400 UNIT capsule Take 400 Units by mouth 4 (four) times a week.     . pantoprazole (PROTONIX) 40 MG tablet Take 1 tablet (40 mg total) by mouth daily. (Patient not taking: Reported on 11/01/2018) 30 tablet 3   No current facility-administered medications on file prior to visit.     There are no Patient Instructions on file for this visit. No follow-ups on file.   Kris Hartmann, NP  This note was completed with Sales executive.  Any errors are purely unintentional.

## 2019-04-11 ENCOUNTER — Encounter: Payer: Self-pay | Admitting: Family Medicine

## 2019-04-11 ENCOUNTER — Other Ambulatory Visit: Payer: Self-pay

## 2019-04-11 ENCOUNTER — Ambulatory Visit (INDEPENDENT_AMBULATORY_CARE_PROVIDER_SITE_OTHER): Payer: Medicare Other | Admitting: Family Medicine

## 2019-04-11 VITALS — BP 161/72 | HR 71 | Temp 98.3°F | Resp 20 | Ht <= 58 in | Wt 95.0 lb

## 2019-04-11 DIAGNOSIS — E785 Hyperlipidemia, unspecified: Secondary | ICD-10-CM | POA: Diagnosis not present

## 2019-04-11 DIAGNOSIS — I150 Renovascular hypertension: Secondary | ICD-10-CM

## 2019-04-11 DIAGNOSIS — F0280 Dementia in other diseases classified elsewhere without behavioral disturbance: Secondary | ICD-10-CM

## 2019-04-11 DIAGNOSIS — F17200 Nicotine dependence, unspecified, uncomplicated: Secondary | ICD-10-CM

## 2019-04-11 DIAGNOSIS — I2510 Atherosclerotic heart disease of native coronary artery without angina pectoris: Secondary | ICD-10-CM | POA: Diagnosis not present

## 2019-04-11 DIAGNOSIS — G301 Alzheimer's disease with late onset: Secondary | ICD-10-CM | POA: Diagnosis not present

## 2019-04-11 NOTE — Progress Notes (Signed)
Patient: Sarah [name] Female    DOB: [birth year]   78 y.o.   MRN: [id number] Visit Date: 04/11/2019  Today's Provider: Wilhemena Durie, MD   Chief Complaint  Patient presents with  . Follow-up   Subjective:    HPI Patient comes in today for a follow up. She was last seen in the office 3 months ago. No medications were changed since her last visit. She is accompanied with her grandson today. She feels well, only c/o allergy symptoms.  Her grandson and son now live with her full time. BP Readings from Last 3 Encounters:  04/11/19 (!) 161/72  03/24/19 (!) 176/74  12/29/18 124/71   Wt Readings from Last 3 Encounters:  04/11/19 95 lb (43.1 kg)  03/24/19 97 lb (44 kg)  12/29/18 93 lb (42.2 kg)    Since last OV, she was seen by the vascular specialist for noninvasive studies for her kidneys. Her grandson reports that her results were stable and there has not been much change since the last time this was done. He does mention that they found a right renal cyst that may need further evaluation.   Allergies  Allergen Reactions  . Penicillins Hives and Other (See Comments)    Has patient had a PCN reaction causing immediate rash, facial/tongue/throat swelling, SOB or lightheadedness with hypotension: No Has patient had a PCN reaction causing severe rash involving mucus membranes or skin necrosis: No Has patient had a PCN reaction that required hospitalization: No Has patient had a PCN reaction occurring within the last 10 years: No  If all of the above answers are "NO", then may proceed with Cephalosporin use.      Current Outpatient Medications:  .  acetaminophen (TYLENOL) 500 MG tablet, Take 500 mg by mouth at bedtime. , Disp: , Rfl:  .  amLODipine (NORVASC) 10 MG tablet, TAKE 1 TABLET BY MOUTH ONCE DAILY, Disp: 90 tablet, Rfl: 3 .  aspirin EC 81 MG tablet, Take 81 mg by mouth daily., Disp: , Rfl:  .  CALCIUM CARBONATE-VITAMIN D PO, Take 1 tablet by mouth 4 (four) times  a week. , Disp: , Rfl:  .  cholecalciferol (VITAMIN D) 1000 units tablet, Take 1,000 Units by mouth 4 (four) times a week. , Disp: , Rfl:  .  cyanocobalamin 1000 MCG tablet, Take 1,000 mcg by mouth 4 (four) times a week. , Disp: , Rfl:  .  lisinopril (PRINIVIL,ZESTRIL) 40 MG tablet, TAKE 1 TABLET BY MOUTH ONCE DAILY, Disp: 90 tablet, Rfl: 3 .  vitamin E 400 UNIT capsule, Take 400 Units by mouth 4 (four) times a week. , Disp: , Rfl:  .  pantoprazole (PROTONIX) 40 MG tablet, Take 1 tablet (40 mg total) by mouth daily. (Patient not taking: Reported on 11/01/2018), Disp: 30 tablet, Rfl: 3  Review of Systems  Constitutional: Negative for activity change, appetite change, chills and unexpected weight change.  HENT: Positive for congestion and postnasal drip.   Eyes: Negative for photophobia, discharge and visual disturbance.  Respiratory: Negative for cough and shortness of breath.   Cardiovascular: Negative for chest pain, palpitations and leg swelling.  Gastrointestinal: Negative.   Musculoskeletal: Positive for arthralgias.  Allergic/Immunologic: Positive for environmental allergies.  Neurological: Negative for dizziness, numbness and headaches.  Psychiatric/Behavioral: Negative for agitation, behavioral problems, confusion, decreased concentration, self-injury, sleep disturbance and suicidal ideas. The patient is not nervous/anxious.     Social History   Tobacco Use  . Smoking status:  Current Every Day Smoker    Packs/day: 0.50    Years: 40.00    Pack years: 20.00    Types: Cigarettes  . Smokeless tobacco: Never Used  . Tobacco comment: less than  Substance Use Topics  . Alcohol use: No    Alcohol/week: 0.0 standard drinks      Objective:   BP (!) 161/72 (BP Location: Right Arm, Patient Position: Sitting, Cuff Size: Small)   Pulse 71   Temp 98.3 F (36.8 C)   Resp 20   Ht 4\' 8"  (1.422 m)   Wt 95 lb (43.1 kg)   SpO2 95%   BMI 21.30 kg/m  Vitals:   04/11/19 1101  BP: (!)  161/72  Pulse: 71  Resp: 20  Temp: 98.3 F (36.8 C)  SpO2: 95%  Weight: 95 lb (43.1 kg)  Height: 4\' 8"  (1.422 m)     Physical Exam Vitals signs reviewed.  Constitutional:      Appearance: Normal appearance.  HENT:     Head: Normocephalic and atraumatic.     Right Ear: External ear normal.     Left Ear: External ear normal.     Nose: Nose normal.  Eyes:     General: No scleral icterus. Cardiovascular:     Rate and Rhythm: Normal rate and regular rhythm.     Heart sounds: Normal heart sounds.  Pulmonary:     Breath sounds: Normal breath sounds.  Abdominal:     Palpations: Abdomen is soft.  Neurological:     Mental Status: She is alert. Mental status is at baseline.  Psychiatric:        Mood and Affect: Mood normal.        Behavior: Behavior normal.         Assessment & Plan    1. Late onset Alzheimer's disease without behavioral disturbance ([place]) Pt not a danger to herself presently.RTC 3 months.  2. Hypertension, renovascular Controlled. - Comprehensive metabolic panel  3. ASCVD (arteriosclerotic cardiovascular disease) Off of statin withe age/AD.  4. Hyperlipidemia, unspecified hyperlipidemia type  - CBC with Differential/Platelet - TSH  5. Smokes tobacco daily Encouraged to stop.     Richard Cranford Mon, MD  [facility]

## 2019-04-12 ENCOUNTER — Telehealth: Payer: Self-pay

## 2019-04-12 LAB — COMPREHENSIVE METABOLIC PANEL
ALT: 7 IU/L (ref 0–32)
AST: 11 IU/L (ref 0–40)
Albumin/Globulin Ratio: 1.6 (ref 1.2–2.2)
Albumin: 4.1 g/dL (ref 3.7–4.7)
Alkaline Phosphatase: 56 IU/L (ref 39–117)
BUN/Creatinine Ratio: 26 (ref 12–28)
BUN: 48 mg/dL — ABNORMAL HIGH (ref 8–27)
Bilirubin Total: 0.2 mg/dL (ref 0.0–1.2)
CO2: 18 mmol/L — ABNORMAL LOW (ref 20–29)
Calcium: 9.6 mg/dL (ref 8.7–10.3)
Chloride: 106 mmol/L (ref 96–106)
Creatinine, Ser: 1.82 mg/dL — ABNORMAL HIGH (ref 0.57–1.00)
GFR calc Af Amer: 30 mL/min/{1.73_m2} — ABNORMAL LOW (ref >59)
GFR calc non Af Amer: 26 mL/min/{1.73_m2} — ABNORMAL LOW (ref >59)
Globulin, Total: 2.6 g/dL (ref 1.5–4.5)
Glucose: 91 mg/dL (ref 65–99)
Potassium: 5.1 mmol/L (ref 3.5–5.2)
Sodium: 141 mmol/L (ref 134–144)
Total Protein: 6.7 g/dL (ref 6.0–8.5)

## 2019-04-12 LAB — CBC WITH DIFFERENTIAL/PLATELET
Basophils Absolute: 0.1 10*3/uL (ref 0.0–0.2)
Basos: 1 %
EOS (ABSOLUTE): 0.6 10*3/uL — ABNORMAL HIGH (ref 0.0–0.4)
Eos: 6 %
Hematocrit: 33.9 % — ABNORMAL LOW (ref 34.0–46.6)
Hemoglobin: 10.9 g/dL — ABNORMAL LOW (ref 11.1–15.9)
Immature Grans (Abs): 0 10*3/uL (ref 0.0–0.1)
Immature Granulocytes: 0 %
Lymphocytes Absolute: 2 10*3/uL (ref 0.7–3.1)
Lymphs: 20 %
MCH: 27.9 pg (ref 26.6–33.0)
MCHC: 32.2 g/dL (ref 31.5–35.7)
MCV: 87 fL (ref 79–97)
Monocytes Absolute: 0.6 10*3/uL (ref 0.1–0.9)
Monocytes: 6 %
Neutrophils Absolute: 6.5 10*3/uL (ref 1.4–7.0)
Neutrophils: 67 %
Platelets: 216 10*3/uL (ref 150–450)
RBC: 3.9 x10E6/uL (ref 3.77–5.28)
RDW: 13.5 % (ref 11.7–15.4)
WBC: 9.8 10*3/uL (ref 3.4–10.8)

## 2019-04-12 LAB — TSH: TSH: 0.628 u[IU]/mL (ref 0.450–4.500)

## 2019-04-12 NOTE — Telephone Encounter (Signed)
Patient grandson Sarah [name] called and stated his grandmother had a call from the office this morning and he would like to know what the call was about due to his grandmother having Dementia. He can be reached at [phone]. Please advise.

## 2019-04-12 NOTE — Telephone Encounter (Signed)
Patient's grand son Lucus advised

## 2019-04-23 ENCOUNTER — Other Ambulatory Visit: Payer: Self-pay | Admitting: Family Medicine

## 2019-04-23 DIAGNOSIS — I10 Essential (primary) hypertension: Secondary | ICD-10-CM

## 2019-06-13 DIAGNOSIS — N183 Chronic kidney disease, stage 3 (moderate): Secondary | ICD-10-CM | POA: Diagnosis not present

## 2019-06-13 DIAGNOSIS — I701 Atherosclerosis of renal artery: Secondary | ICD-10-CM | POA: Diagnosis not present

## 2019-06-13 DIAGNOSIS — I10 Essential (primary) hypertension: Secondary | ICD-10-CM | POA: Diagnosis not present

## 2019-06-22 NOTE — Progress Notes (Signed)
Subjective:   Sarah [name] is a 78 y.o. female who presents for Medicare Annual (Subsequent) preventive examination.    This visit is being conducted through telemedicine due to the COVID-19 pandemic. This patient has given me verbal consent via doximity to conduct this visit, patient states they are participating from their home address. Some vital signs may be absent or patient reported.    Patient identification: identified by name, DOB, and current address  Review of Systems:  N/A  Cardiac Risk Factors include: advanced age (>23men, >68 women);dyslipidemia;smoking/ tobacco exposure     Objective:     Vitals: There were no vitals taken for this visit.  There is no height or weight on file to calculate BMI. Unable to obtain vitals due to visit being conducted via telephonically.   Advanced Directives 06/23/2019 06/22/2018 06/18/2018 05/30/2017 01/26/2017 12/16/2016 05/02/2015  Does Patient Have a Medical Advance Directive? Yes No No No No No No  Type of Advance Directive Living will - - - - - -  Would patient like information on creating a medical advance directive? - No - Patient declined No - Patient declined No - Patient declined - Yes (ED - Information included in AVS) -    Tobacco Social History   Tobacco Use  Smoking Status Current Every Day Smoker  . Packs/day: 0.50  . Years: 40.00  . Pack years: 20.00  . Types: Cigarettes  Smokeless Tobacco Never Used  Tobacco Comment   less than     Ready to quit: No Counseling given: No Comment: less than   Clinical Intake:  Pre-visit preparation completed: Yes  Pain : No/denies pain Pain Score: 0-No pain     Nutritional Risks: Nausea/ vomitting/ diarrhea(Has had diarrhea more recently. Unsure of the cause. Sarah [name] believes it is due to her diet and medication. Declined scheduling an apt to f/u on this.) Diabetes: No  How often do you need to have someone help you when you read instructions, pamphlets, or other written  materials from your doctor or pharmacy?: 1 - Never  Interpreter Needed?: No  Information entered by :: [facility], LPN  Past Medical History:  Diagnosis Date  . Arthritis   . Dementia ([place])    [place]IMERS  . Ear anomaly    INNER EAR ISSUES  . Hyperlipidemia   . Hypertension    Past Surgical History:  Procedure Laterality Date  . APPENDECTOMY    . CATARACT EXTRACTION W/PHACO Right 03/09/2018   Procedure: CATARACT EXTRACTION PHACO AND INTRAOCULAR LENS PLACEMENT (IOC);  Surgeon: Birder Robson, MD;  Location: [facility] ORS;  Service: Ophthalmology;  Laterality: Right;  [place] 00:38.9 AP% 15.9 CDE 6.19 FLUID PACK LOT # [id number]  . CATARACT EXTRACTION W/PHACO Left 06/22/2018   Procedure: CATARACT EXTRACTION PHACO AND INTRAOCULAR LENS PLACEMENT (IOC);  Surgeon: Birder Robson, MD;  Location: [facility] ORS;  Service: Ophthalmology;  Laterality: Left;  [place] 00:50 AP% 16.5 CDE 8.25 Fluid pack lot # [id number]  . CESAREAN SECTION    . CHOLECYSTECTOMY    . CYSTOSCOPY WITH STENT PLACEMENT     Family History  Problem Relation Age of Onset  . Colon cancer Father   . Pancreatic cancer Sister   . GER disease Son   . Alzheimer's disease Sister    Social History   Socioeconomic History  . Marital status: Widowed    Spouse name: Not on file  . Number of children: 2  . Years of education: Not on file  . Highest education level: High school  graduate  Occupational History  . Occupation: retired  Scientific laboratory technician  . Financial resource strain: Not hard at all  . Food insecurity    Worry: Never true    Inability: Never true  . Transportation needs    Medical: No    Non-medical: No  Tobacco Use  . Smoking status: Current Every Day Smoker    Packs/day: 0.50    Years: 40.00    Pack years: 20.00    Types: Cigarettes  . Smokeless tobacco: Never Used  . Tobacco comment: less than  Substance and Sexual Activity  . Alcohol use: No    Alcohol/week: 0.0 standard drinks  . Drug use: No  . Sexual  activity: Not on file  Lifestyle  . Physical activity    Days per week: 0 days    Minutes per session: 0 min  . Stress: Not at all  Relationships  . Social Herbalist on phone: Patient refused    Gets together: Patient refused    Attends religious service: Patient refused    Active member of club or organization: Patient refused    Attends meetings of clubs or organizations: Patient refused    Relationship status: Patient refused  Other Topics Concern  . Not on file  Social History Narrative  . Not on file    Outpatient Encounter Medications as of 06/23/2019  Medication Sig  . acetaminophen (TYLENOL) 500 MG tablet Take 500 mg by mouth at bedtime.   Marland Kitchen amLODipine (NORVASC) 10 MG tablet Take 1 tablet by mouth once daily  . aspirin EC 81 MG tablet Take 81 mg by mouth daily.  Marland Kitchen CALCIUM CARBONATE-VITAMIN D PO Take 1 tablet by mouth 4 (four) times a week.   . cholecalciferol (VITAMIN D) 1000 units tablet Take 1,000 Units by mouth 4 (four) times a week.   . cyanocobalamin 1000 MCG tablet Take 1,000 mcg by mouth 4 (four) times a week.   Marland Kitchen lisinopril (ZESTRIL) 40 MG tablet Take 1 tablet by mouth once daily  . vitamin C (ASCORBIC ACID) 500 MG tablet Take 500 mg by mouth daily.  . vitamin E 400 UNIT capsule Take 400 Units by mouth 4 (four) times a week.   . pantoprazole (PROTONIX) 40 MG tablet Take 1 tablet (40 mg total) by mouth daily. (Patient not taking: Reported on 06/23/2019)   No facility-administered encounter medications on file as of 06/23/2019.     Activities of Daily Living In your present state of health, do you have any difficulty performing the following activities: 06/23/2019  Hearing? Y  Comment Does not wear hearing aids.  Vision? N  Difficulty concentrating or making decisions? Y  Comment Has dementia.  Walking or climbing stairs? N  Dressing or bathing? Y  Comment Has assistance bathing and can dress herself.  Doing errands, shopping? Y  Comment Does not  drive.  Preparing Food and eating ? Y  Comment Does not cook.  Using the Toilet? N  In the past six months, have you accidently leaked urine? N  Do you have problems with loss of bowel control? Y  Comment Due to recent episodes with diarrhea.  Managing your Medications? Y  Comment Grandson manages medication.  Managing your Finances? Y  Comment Son manages finances.  Housekeeping or managing your Housekeeping? Y  Comment Does minimal cleaning.  Some recent data might be hidden    Patient Care Team: Jerrol Banana., MD as PCP - General (Family Medicine) Murlean Iba,  MD as Consulting Physician (Internal Medicine) Delana Meyer, Dolores Lory, MD (Vascular Surgery) Cathi Roan, [facility] (Pharmacist) Benedetto Goad, RN as Case Manager Birder Robson, MD as Referring Physician (Ophthalmology)    Assessment:   This is a routine wellness examination for [facility].  Exercise Activities and Dietary recommendations Current Exercise Habits: The patient does not participate in regular exercise at present, Exercise limited by: Other - see comments(Due to dementia.)  Goals      Patient Stated   . "We need to simplify things for Mom" (pt-stated)     Pharmacist Clinical Goal(s): Over the next 10 days, CCM pharmacist will collaborate with physician to simplify Ms. Cogdell's medication regimen.    Interventions: - CCM pharmacist updated medication list based  - Discontinue donepezil due to limited efficacy and bowel incontinence - discontinue alendronate for drug holiday  - need to schedule a new DEXA bone density exam (order has been placed) - caregivers will consider the pros and cons of twice daily memantine (Namenda) for dementia   Patient Self Care Activities:  . Per caregiver, patient must be observed taking medications   Please see past updates related to this goal by clicking on the "Past Updates" button in the selected goal         Other   . Quit Smoking     Recommend to cut  back on how many cigarettes smoked a day until completely quits.        Fall Risk: Fall Risk  06/23/2019 12/15/2018 06/18/2018 02/11/2018 12/16/2016  Falls in the past year? 0 0 No No No    FALL RISK PREVENTION PERTAINING TO THE HOME:  Any stairs in or around the home? Yes  If so, are there any without handrails? No   Home free of loose throw rugs in walkways, pet beds, electrical cords, etc? Yes  Adequate lighting in your home to reduce risk of falls? Yes   ASSISTIVE DEVICES UTILIZED TO PREVENT FALLS:  Life alert? No  Use of a cane, walker or w/c? No  Grab bars in the bathroom? No  Shower chair or bench in shower? Yes  Elevated toilet seat or a handicapped toilet? No    TIMED UP AND GO:  Was the test performed? No .    Depression Screen PHQ 2/9 Scores 06/23/2019 12/15/2018 06/18/2018 02/11/2018  PHQ - 2 Score 0 0 0 0  Exception Documentation - - - -     Cognitive Function: Unable to complete due to dementia and speaking with grandson.      6CIT Screen 06/18/2018 12/16/2016  What Year? 4 points 0 points  What month? 3 points 0 points  What time? 3 points 0 points  Count back from 20 4 points 2 points  Months in reverse 4 points 4 points  Repeat phrase 10 points 10 points  Total Score 28 16    Immunization History  Administered Date(s) Administered  . Pneumococcal Conjugate-13 08/01/2014  . Pneumococcal Polysaccharide-23 03/01/2013  . Td 02/10/1996  . Tdap 05/30/2017    Qualifies for Shingles Vaccine? Yes . Due for Shingrix. Education has been provided regarding the importance of this vaccine. Pt has been advised to call insurance company to determine out of pocket expense. Advised may also receive vaccine at local pharmacy or Health Dept. Verbalized acceptance and understanding.  Tdap: Up to date  Flu Vaccine: Due for Flu vaccine. Does the patient want to receive this vaccine today?  No .   Pneumococcal Vaccine: Completed series  Screening Tests Health  Maintenance  Topic Date Due  . DEXA SCAN  07/12/2015  . INFLUENZA VACCINE  05/28/2019  . TETANUS/TDAP  05/31/2027  . PNA vac Low Risk Adult  Completed    Cancer Screenings:  Colorectal Screening: No longer required.   Mammogram: No longer required.   Bone Density: Completed 07/11/13. Results reflect OSTEOPOROSIS. Repeat every 2 years. Ordered 12/02/18. Grandson to call [facility] to set up apt.   Lung Cancer Screening: (Low Dose CT Chest recommended if Age 57-80 years, 30 pack-year currently smoking OR have quit w/in 15years.) does not qualify.   Additional Screening:  Vision Screening: Recommended annual ophthalmology exams for early detection of glaucoma and other disorders of the eye.  Dental Screening: Recommended annual dental exams for proper oral hygiene  Community Resource Referral:  CRR required this visit?  No       Plan:  I have personally reviewed and addressed the Medicare Annual Wellness questionnaire and have noted the following in the patient's chart:  A. Medical and social history B. Use of alcohol, tobacco or illicit drugs  C. Current medications and supplements D. Functional ability and status E.  Nutritional status F.  Physical activity G. Advance directives H. List of other physicians I.  Hospitalizations, surgeries, and ER visits in previous 12 months J.  [place] such as hearing and vision if needed, cognitive and depression L. Referrals and appointments   In addition, I have reviewed and discussed with patient certain preventive protocols, quality metrics, and best practice recommendations. A written personalized care plan for preventive services as well as general preventive health recommendations were provided to patient. Nurse Health Advisor  Signed,    Joson Sapp [place], [place]  5/63/1497 Nurse Health Advisor   Nurse Notes: Pt is due for an influenza vaccine at next in office visit. Grandson to call imaging center to set up  DEXA scan.

## 2019-06-23 ENCOUNTER — Other Ambulatory Visit: Payer: Self-pay

## 2019-06-23 ENCOUNTER — Ambulatory Visit (INDEPENDENT_AMBULATORY_CARE_PROVIDER_SITE_OTHER): Payer: Medicare Other

## 2019-06-23 DIAGNOSIS — Z0000 Encounter for general adult medical examination without abnormal findings: Secondary | ICD-10-CM

## 2019-06-23 NOTE — Patient Instructions (Signed)
Sarah [name] , Thank you for taking time to come for your Medicare Wellness Visit. I appreciate your ongoing commitment to your health goals. Please review the following plan we discussed and let me know if I can assist you in the future.   Screening recommendations/referrals: Colonoscopy: No longer required.  Mammogram: No longer required.  Bone Density: Ordered 11/2018 Recommended yearly ophthalmology/optometry visit for glaucoma screening and checkup Recommended yearly dental visit for hygiene and checkup  Vaccinations: Influenza vaccine: Currently due Pneumococcal vaccine: Completed series Tdap vaccine: Up to date, due 05/2027 Shingles vaccine: Pt declines today.     Advanced directives: Please bring a copy of your POA (Power of Attorney) and/or Living Will to your next appointment.   Conditions/risks identified: Continue to work towards smoking cessation.   Next appointment: 07/12/19 with Dr Rosanna Randy.    Preventive Care 80 Years and Older, Female Preventive care refers to lifestyle choices and visits with your health care provider that can promote health and wellness. What does preventive care include?  A yearly physical exam. This is also called an annual well check.  Dental exams once or twice a year.  Routine eye exams. Ask your health care provider how often you should have your eyes checked.  Personal lifestyle choices, including:  Daily care of your teeth and gums.  Regular physical activity.  Eating a healthy diet.  Avoiding tobacco and drug use.  Limiting alcohol use.  Practicing safe sex.  Taking low-dose aspirin every day.  Taking vitamin and mineral supplements as recommended by your health care provider. What happens during an annual well check? The services and screenings done by your health care provider during your annual well check will depend on your age, overall health, lifestyle risk factors, and family history of disease. Counseling  Your health  care provider may ask you questions about your:  Alcohol use.  Tobacco use.  Drug use.  Emotional well-being.  Home and relationship well-being.  Sexual activity.  Eating habits.  History of falls.  Memory and ability to understand (cognition).  Work and work Statistician.  Reproductive health. Screening  You may have the following tests or measurements:  Height, weight, and BMI.  Blood pressure.  Lipid and cholesterol levels. These may be checked every 5 years, or more frequently if you are over 74 years old.  Skin check.  Lung cancer screening. You may have this screening every year starting at age 6 if you have a 30-pack-year history of smoking and currently smoke or have quit within the past 15 years.  Fecal occult blood test (FOBT) of the stool. You may have this test every year starting at age 59.  Flexible sigmoidoscopy or colonoscopy. You may have a sigmoidoscopy every 5 years or a colonoscopy every 10 years starting at age 83.  Hepatitis C blood test.  Hepatitis B blood test.  Sexually transmitted disease (STD) testing.  Diabetes screening. This is done by checking your blood sugar (glucose) after you have not eaten for a while (fasting). You may have this done every 1-3 years.  Bone density scan. This is done to screen for osteoporosis. You may have this done starting at age 2.  Mammogram. This may be done every 1-2 years. Talk to your health care provider about how often you should have regular mammograms. Talk with your health care provider about your test results, treatment options, and if necessary, the need for more tests. Vaccines  Your health care provider may recommend certain vaccines, such as:  Influenza vaccine. This is recommended every year.  Tetanus, diphtheria, and acellular pertussis (Tdap, Td) vaccine. You may need a Td booster every 10 years.  Zoster vaccine. You may need this after age 63.  Pneumococcal 13-valent conjugate  (PCV13) vaccine. One dose is recommended after age 59.  Pneumococcal polysaccharide (PPSV23) vaccine. One dose is recommended after age 36. Talk to your health care provider about which screenings and vaccines you need and how often you need them. This information is not intended to replace advice given to you by your health care provider. Make sure you discuss any questions you have with your health care provider. Document Released: 11/09/2015 Document Revised: 07/02/2016 Document Reviewed: 08/14/2015 Elsevier Interactive Patient Education  2017 [employer] Prevention in the Home Falls can cause injuries. They can happen to people of all ages. There are many things you can do to make your home safe and to help prevent falls. What can I do on the outside of my home?  Regularly fix the edges of walkways and driveways and fix any cracks.  Remove anything that might make you trip as you walk through a door, such as a raised step or threshold.  Trim any bushes or trees on the path to your home.  Use bright outdoor lighting.  Clear any walking paths of anything that might make someone trip, such as rocks or tools.  Regularly check to see if handrails are loose or broken. Make sure that both sides of any steps have handrails.  Any raised decks and porches should have guardrails on the edges.  Have any leaves, snow, or ice cleared regularly.  Use sand or salt on walking paths during winter.  Clean up any spills in your garage right away. This includes oil or grease spills. What can I do in the bathroom?  Use night lights.  Install grab bars by the toilet and in the tub and shower. Do not use towel bars as grab bars.  Use non-skid mats or decals in the tub or shower.  If you need to sit down in the shower, use a plastic, non-slip stool.  Keep the floor dry. Clean up any water that spills on the floor as soon as it happens.  Remove soap buildup in the tub or shower  regularly.  Attach bath mats securely with double-sided non-slip rug tape.  Do not have throw rugs and other things on the floor that can make you trip. What can I do in the bedroom?  Use night lights.  Make sure that you have a light by your bed that is easy to reach.  Do not use any sheets or blankets that are too big for your bed. They should not hang down onto the floor.  Have a firm chair that has side arms. You can use this for support while you get dressed.  Do not have throw rugs and other things on the floor that can make you trip. What can I do in the kitchen?  Clean up any spills right away.  Avoid walking on wet floors.  Keep items that you use a lot in easy-to-reach places.  If you need to reach something above you, use a strong step stool that has a grab bar.  Keep electrical cords out of the way.  Do not use floor polish or wax that makes floors slippery. If you must use wax, use non-skid floor wax.  Do not have throw rugs and other things on the floor that  can make you trip. What can I do with my stairs?  Do not leave any items on the stairs.  Make sure that there are handrails on both sides of the stairs and use them. Fix handrails that are broken or loose. Make sure that handrails are as long as the stairways.  Check any carpeting to make sure that it is firmly attached to the stairs. Fix any carpet that is loose or worn.  Avoid having throw rugs at the top or bottom of the stairs. If you do have throw rugs, attach them to the floor with carpet tape.  Make sure that you have a light switch at the top of the stairs and the bottom of the stairs. If you do not have them, ask someone to add them for you. What else can I do to help prevent falls?  Wear shoes that:  Do not have high heels.  Have rubber bottoms.  Are comfortable and fit you well.  Are closed at the toe. Do not wear sandals.  If you use a stepladder:  Make sure that it is fully  opened. Do not climb a closed stepladder.  Make sure that both sides of the stepladder are locked into place.  Ask someone to hold it for you, if possible.  Clearly mark and make sure that you can see:  Any grab bars or handrails.  First and last steps.  Where the edge of each step is.  Use tools that help you move around (mobility aids) if they are needed. These include:  Canes.  Walkers.  Scooters.  Crutches.  Turn on the lights when you go into a dark area. Replace any light bulbs as soon as they burn out.  Set up your furniture so you have a clear path. Avoid moving your furniture around.  If any of your floors are uneven, fix them.  If there are any pets around you, be aware of where they are.  Review your medicines with your doctor. Some medicines can make you feel dizzy. This can increase your chance of falling. Ask your doctor what other things that you can do to help prevent falls. This information is not intended to replace advice given to you by your health care provider. Make sure you discuss any questions you have with your health care provider. Document Released: 08/09/2009 Document Revised: 03/20/2016 Document Reviewed: 11/17/2014 Elsevier Interactive Patient Education  2017 [employer].

## 2019-06-28 ENCOUNTER — Ambulatory Visit (INDEPENDENT_AMBULATORY_CARE_PROVIDER_SITE_OTHER): Payer: Medicare Other | Admitting: Family Medicine

## 2019-06-28 ENCOUNTER — Other Ambulatory Visit: Payer: Self-pay

## 2019-06-28 ENCOUNTER — Encounter: Payer: Self-pay | Admitting: Family Medicine

## 2019-06-28 VITALS — BP 86/47 | HR 68 | Temp 97.9°F | Resp 14 | Wt 85.0 lb

## 2019-06-28 DIAGNOSIS — R197 Diarrhea, unspecified: Secondary | ICD-10-CM | POA: Diagnosis not present

## 2019-06-28 DIAGNOSIS — R5383 Other fatigue: Secondary | ICD-10-CM

## 2019-06-28 DIAGNOSIS — G301 Alzheimer's disease with late onset: Secondary | ICD-10-CM | POA: Diagnosis not present

## 2019-06-28 DIAGNOSIS — F17200 Nicotine dependence, unspecified, uncomplicated: Secondary | ICD-10-CM

## 2019-06-28 DIAGNOSIS — R634 Abnormal weight loss: Secondary | ICD-10-CM | POA: Diagnosis not present

## 2019-06-28 DIAGNOSIS — F0280 Dementia in other diseases classified elsewhere without behavioral disturbance: Secondary | ICD-10-CM

## 2019-06-28 NOTE — Patient Instructions (Signed)
Stop Amlodipine.   Try probiotic daily.

## 2019-06-28 NOTE — Progress Notes (Signed)
Patient: Sarah [name] Female    DOB: [birth year]   78 y.o.   MRN: [id number] Visit Date: 06/28/2019  Today's Provider: Wilhemena Durie, MD   Chief Complaint  Patient presents with  . Fatigue   Subjective:   HPI Patient comes in today c/o fatigue, loss of appetite, and loose stools for the last 2 weeks. She also mentions that she has had bowel incontinence for the last 2 weeks. Her grandson mentions that she has not been herself. She sleeps constantly.  Her BP has averaged in the 110s/60s. She has lost 10lbs since her last OV.   BP Readings from Last 3 Encounters:  06/28/19 (!) 86/47  04/11/19 (!) 161/72  03/24/19 (!) 176/74   Wt Readings from Last 3 Encounters:  06/28/19 85 lb (38.6 kg)  04/11/19 95 lb (43.1 kg)  03/24/19 97 lb (44 kg)     Allergies  Allergen Reactions  . Penicillins Hives and Other (See Comments)    Has patient had a PCN reaction causing immediate rash, facial/tongue/throat swelling, SOB or lightheadedness with hypotension: No Has patient had a PCN reaction causing severe rash involving mucus membranes or skin necrosis: No Has patient had a PCN reaction that required hospitalization: No Has patient had a PCN reaction occurring within the last 10 years: No  If all of the above answers are "NO", then may proceed with Cephalosporin use.      Current Outpatient Medications:  .  acetaminophen (TYLENOL) 500 MG tablet, Take 500 mg by mouth at bedtime. , Disp: , Rfl:  .  amLODipine (NORVASC) 10 MG tablet, Take 1 tablet by mouth once daily, Disp: 90 tablet, Rfl: 3 .  aspirin EC 81 MG tablet, Take 81 mg by mouth daily., Disp: , Rfl:  .  CALCIUM CARBONATE-VITAMIN D PO, Take 1 tablet by mouth 4 (four) times a week. , Disp: , Rfl:  .  cholecalciferol (VITAMIN D) 1000 units tablet, Take 1,000 Units by mouth 4 (four) times a week. , Disp: , Rfl:  .  cyanocobalamin 1000 MCG tablet, Take 1,000 mcg by mouth 4 (four) times a week. , Disp: , Rfl:  .  lisinopril  (ZESTRIL) 40 MG tablet, Take 1 tablet by mouth once daily, Disp: 90 tablet, Rfl: 0 .  vitamin C (ASCORBIC ACID) 500 MG tablet, Take 500 mg by mouth daily., Disp: , Rfl:  .  vitamin E 400 UNIT capsule, Take 400 Units by mouth 4 (four) times a week. , Disp: , Rfl:  .  pantoprazole (PROTONIX) 40 MG tablet, Take 1 tablet (40 mg total) by mouth daily. (Patient not taking: Reported on 06/23/2019), Disp: 30 tablet, Rfl: 3  Review of Systems  Constitutional: Negative for activity change, appetite change, chills and unexpected weight change.  HENT: Positive for congestion and postnasal drip.   Eyes: Negative for photophobia, discharge and visual disturbance.  Respiratory: Negative for cough and shortness of breath.   Cardiovascular: Negative for chest pain, palpitations and leg swelling.  Gastrointestinal: Positive for diarrhea.  Musculoskeletal: Positive for arthralgias.  Allergic/Immunologic: Positive for environmental allergies.  Neurological: Negative for dizziness, numbness and headaches.  Psychiatric/Behavioral: Negative for agitation, behavioral problems, confusion, decreased concentration, self-injury, sleep disturbance and suicidal ideas. The patient is not nervous/anxious.     Social History   Tobacco Use  . Smoking status: Current Every Day Smoker    Packs/day: 0.50    Years: 40.00    Pack years: 20.00    Types:  Cigarettes  . Smokeless tobacco: Never Used  . Tobacco comment: less than  Substance Use Topics  . Alcohol use: No    Alcohol/week: 0.0 standard drinks      Objective:   BP (!) 86/47   Pulse 68   Temp 97.9 F (36.6 C)   Resp 14   Wt 85 lb (38.6 kg)   SpO2 99%   BMI 19.06 kg/m  Vitals:   06/28/19 1024  BP: (!) 86/47  Pulse: 68  Resp: 14  Temp: 97.9 F (36.6 C)  SpO2: 99%  Weight: 85 lb (38.6 kg)  Body mass index is 19.06 kg/m.   Physical Exam Vitals signs reviewed.  Constitutional:      Appearance: She is well-developed.     Comments: Cachectic  ,pleasant,female with obvious cognitve decline.  HENT:     Head: Normocephalic and atraumatic.     Right Ear: External ear normal.     Left Ear: External ear normal.     Nose: Nose normal.  Eyes:     General: No scleral icterus.    Conjunctiva/sclera: Conjunctivae normal.  Neck:     Thyroid: No thyromegaly.  Cardiovascular:     Rate and Rhythm: Normal rate and regular rhythm.     Heart sounds: Normal heart sounds.  Pulmonary:     Effort: Pulmonary effort is normal.     Breath sounds: Normal breath sounds.  Abdominal:     Palpations: Abdomen is soft.  Musculoskeletal:        General: No swelling.  Skin:    General: Skin is warm and dry.  Neurological:     General: No focal deficit present.     Mental Status: She is alert. Mental status is at baseline.  Psychiatric:        Mood and Affect: Mood normal.        Behavior: Behavior normal.      No results found for any visits on 06/28/19.     Assessment & Plan    1. Late onset Alzheimer's disease without behavioral disturbance (HCC) Moderate to severe.Consider Hospice in near future.  2. Fatigue, unspecified type  - CBC with Differential/Platelet - TSH  3. Weight loss Secondary to AD most likely but I did discuss with grandson today who lives with pt that there could certainly be a sinister medical cause but I do not think any medical evaluation would benefit the pt. More than 50% 25 minute visit spent in counseling or coordination of care  - CBC with Differential/Platelet - Comprehensive metabolic panel  4. Smokes tobacco daily Rarely smoke now. 5.Diarrhea Try probiotic daily.     Cranford Mon, MD  [facility]

## 2019-06-29 ENCOUNTER — Telehealth: Payer: Self-pay

## 2019-06-29 LAB — CBC WITH DIFFERENTIAL/PLATELET
Basophils Absolute: 0.1 10*3/uL (ref 0.0–0.2)
Basos: 1 %
EOS (ABSOLUTE): 0.4 10*3/uL (ref 0.0–0.4)
Eos: 3 %
Hematocrit: 34.9 % (ref 34.0–46.6)
Hemoglobin: 11 g/dL — ABNORMAL LOW (ref 11.1–15.9)
Immature Grans (Abs): 0.1 10*3/uL (ref 0.0–0.1)
Immature Granulocytes: 1 %
Lymphocytes Absolute: 1.9 10*3/uL (ref 0.7–3.1)
Lymphs: 14 %
MCH: 27.7 pg (ref 26.6–33.0)
MCHC: 31.5 g/dL (ref 31.5–35.7)
MCV: 88 fL (ref 79–97)
Monocytes Absolute: 0.6 10*3/uL (ref 0.1–0.9)
Monocytes: 5 %
Neutrophils Absolute: 10.3 10*3/uL — ABNORMAL HIGH (ref 1.4–7.0)
Neutrophils: 76 %
Platelets: 339 10*3/uL (ref 150–450)
RBC: 3.97 x10E6/uL (ref 3.77–5.28)
RDW: 14.5 % (ref 11.7–15.4)
WBC: 13.3 10*3/uL — ABNORMAL HIGH (ref 3.4–10.8)

## 2019-06-29 LAB — COMPREHENSIVE METABOLIC PANEL
ALT: 7 IU/L (ref 0–32)
AST: 9 IU/L (ref 0–40)
Albumin/Globulin Ratio: 1.5 (ref 1.2–2.2)
Albumin: 4.3 g/dL (ref 3.7–4.7)
Alkaline Phosphatase: 57 IU/L (ref 39–117)
BUN/Creatinine Ratio: 31 — ABNORMAL HIGH (ref 12–28)
BUN: 82 mg/dL (ref 8–27)
Bilirubin Total: 0.2 mg/dL (ref 0.0–1.2)
CO2: 12 mmol/L — ABNORMAL LOW (ref 20–29)
Calcium: 9.7 mg/dL (ref 8.7–10.3)
Chloride: 106 mmol/L (ref 96–106)
Creatinine, Ser: 2.65 mg/dL — ABNORMAL HIGH (ref 0.57–1.00)
GFR calc Af Amer: 19 mL/min/{1.73_m2} — ABNORMAL LOW (ref >59)
GFR calc non Af Amer: 16 mL/min/{1.73_m2} — ABNORMAL LOW (ref >59)
Globulin, Total: 2.8 g/dL (ref 1.5–4.5)
Glucose: 103 mg/dL — ABNORMAL HIGH (ref 65–99)
Potassium: 5.2 mmol/L (ref 3.5–5.2)
Sodium: 137 mmol/L (ref 134–144)
Total Protein: 7.1 g/dL (ref 6.0–8.5)

## 2019-06-29 LAB — TSH: TSH: 0.628 u[IU]/mL (ref 0.450–4.500)

## 2019-06-29 NOTE — Telephone Encounter (Signed)
-----   Message from Jerrol Banana., MD sent at 06/29/2019  4:03 PM EDT ----- Try to push fluids--kidneys trying to shut down. Would family like Hospice or Palliative care  consult for options.?

## 2019-06-29 NOTE — Telephone Encounter (Signed)
Spoke with grandson about labs and he would like to discuss palliative care during follow up appt on 9/15. He reports that he will discuss with the family what will be the best option for the patient.

## 2019-07-07 DIAGNOSIS — N179 Acute kidney failure, unspecified: Secondary | ICD-10-CM | POA: Diagnosis not present

## 2019-07-07 DIAGNOSIS — N183 Chronic kidney disease, stage 3 (moderate): Secondary | ICD-10-CM | POA: Diagnosis not present

## 2019-07-07 DIAGNOSIS — I129 Hypertensive chronic kidney disease with stage 1 through stage 4 chronic kidney disease, or unspecified chronic kidney disease: Secondary | ICD-10-CM | POA: Diagnosis not present

## 2019-07-07 DIAGNOSIS — I701 Atherosclerosis of renal artery: Secondary | ICD-10-CM | POA: Diagnosis not present

## 2019-07-12 ENCOUNTER — Encounter: Payer: Self-pay | Admitting: Family Medicine

## 2019-07-12 ENCOUNTER — Ambulatory Visit (INDEPENDENT_AMBULATORY_CARE_PROVIDER_SITE_OTHER): Payer: Medicare Other | Admitting: Family Medicine

## 2019-07-12 ENCOUNTER — Other Ambulatory Visit: Payer: Self-pay

## 2019-07-12 VITALS — BP 100/58 | HR 62 | Temp 96.9°F | Resp 18 | Wt 85.4 lb

## 2019-07-12 DIAGNOSIS — I701 Atherosclerosis of renal artery: Secondary | ICD-10-CM | POA: Diagnosis not present

## 2019-07-12 DIAGNOSIS — I150 Renovascular hypertension: Secondary | ICD-10-CM

## 2019-07-12 DIAGNOSIS — F0280 Dementia in other diseases classified elsewhere without behavioral disturbance: Secondary | ICD-10-CM

## 2019-07-12 DIAGNOSIS — N183 Chronic kidney disease, stage 3 unspecified: Secondary | ICD-10-CM

## 2019-07-12 DIAGNOSIS — G301 Alzheimer's disease with late onset: Secondary | ICD-10-CM | POA: Diagnosis not present

## 2019-07-12 DIAGNOSIS — R634 Abnormal weight loss: Secondary | ICD-10-CM

## 2019-07-12 NOTE — Progress Notes (Signed)
Patient: Sarah [name] Female    DOB: [birth year]   77 y.o.   MRN: [id number] Visit Date: 07/12/2019  Today's Provider: Wilhemena Durie, MD   Chief Complaint  Patient presents with  . Follow-up   Subjective:     HPI Follow up. Discuss palliative care. Patient has been experiencing dizziness for the last few weeks, mood swings, and sleeping more than usual.  Allergies  Allergen Reactions  . Penicillins Hives and Other (See Comments)    Has patient had a PCN reaction causing immediate rash, facial/tongue/throat swelling, SOB or lightheadedness with hypotension: No Has patient had a PCN reaction causing severe rash involving mucus membranes or skin necrosis: No Has patient had a PCN reaction that required hospitalization: No Has patient had a PCN reaction occurring within the last 10 years: No  If all of the above answers are "NO", then may proceed with Cephalosporin use.      Current Outpatient Medications:  .  acetaminophen (TYLENOL) 500 MG tablet, Take 500 mg by mouth at bedtime. , Disp: , Rfl:  .  CALCIUM CARBONATE-VITAMIN D PO, Take 1 tablet by mouth 4 (four) times a week. , Disp: , Rfl:  .  cholecalciferol (VITAMIN D) 1000 units tablet, Take 1,000 Units by mouth 4 (four) times a week. , Disp: , Rfl:  .  cyanocobalamin 1000 MCG tablet, Take 1,000 mcg by mouth 4 (four) times a week. , Disp: , Rfl:  .  vitamin C (ASCORBIC ACID) 500 MG tablet, Take 500 mg by mouth daily., Disp: , Rfl:  .  vitamin E 400 UNIT capsule, Take 400 Units by mouth 4 (four) times a week. , Disp: , Rfl:  .  aspirin EC 81 MG tablet, Take 81 mg by mouth daily., Disp: , Rfl:  .  lisinopril (ZESTRIL) 40 MG tablet, Take 1 tablet by mouth once daily (Patient not taking: Reported on 07/12/2019), Disp: 90 tablet, Rfl: 0 .  pantoprazole (PROTONIX) 40 MG tablet, Take 1 tablet (40 mg total) by mouth daily. (Patient not taking: Reported on 06/23/2019), Disp: 30 tablet, Rfl: 3  Review of Systems   Constitutional: Positive for activity change and appetite change.  HENT: Negative.   Respiratory: Negative.   Cardiovascular: Negative.   Gastrointestinal: Negative.   Endocrine: Negative.   Allergic/Immunologic: Negative.   Neurological:       Memory loss progressive.  Hematological: Negative.   Psychiatric/Behavioral: Negative.     Social History   Tobacco Use  . Smoking status: Current Every Day Smoker    Packs/day: 0.50    Years: 40.00    Pack years: 20.00    Types: Cigarettes  . Smokeless tobacco: Never Used  . Tobacco comment: less than  Substance Use Topics  . Alcohol use: No    Alcohol/week: 0.0 standard drinks      Objective:   There were no vitals taken for this visit. There were no vitals filed for this visit.There is no height or weight on file to calculate BMI.   Physical Exam Vitals signs reviewed.  Constitutional:      Appearance: She is well-developed.     Comments: Cachectic ,pleasant,female with obvious cognitve decline.  HENT:     Head: Normocephalic and atraumatic.     Right Ear: External ear normal.     Left Ear: External ear normal.     Nose: Nose normal.  Eyes:     General: No scleral icterus.    Conjunctiva/sclera: Conjunctivae  normal.  Neck:     Thyroid: No thyromegaly.  Cardiovascular:     Rate and Rhythm: Normal rate and regular rhythm.     Heart sounds: Normal heart sounds.  Pulmonary:     Effort: Pulmonary effort is normal.     Breath sounds: Normal breath sounds.  Abdominal:     Palpations: Abdomen is soft.  Musculoskeletal:        General: No swelling.  Skin:    General: Skin is warm and dry.  Neurological:     General: No focal deficit present.     Mental Status: She is alert. Mental status is at baseline.  Psychiatric:        Mood and Affect: Mood normal.        Behavior: Behavior normal.      No results found for any visits on 07/12/19.     Assessment & Plan    1. Late onset Alzheimer's disease without  behavioral disturbance (HCC) Progressive with weight loss. Palliative care  Discussed. I do not think we are far from Hospice care. - CBC w/Diff/Platelet  2. Renal artery stenosis (HCC)  - Renal Function Panel  3. Chronic kidney disease (CKD), stage III (moderate) (HCC)  - Renal Function Panel  4. Hypertension, renovascular  - CBC w/Diff/Platelet  5. Weight loss 10 lbs since last visit. More than 50% 25 minute visit spent in counseling or coordination of care     Wilhemena Durie, MD  [facility]

## 2019-07-13 LAB — RENAL FUNCTION PANEL
Albumin: 4.1 g/dL (ref 3.7–4.7)
BUN/Creatinine Ratio: 40 — ABNORMAL HIGH (ref 12–28)
BUN: 101 mg/dL (ref 8–27)
CO2: 15 mmol/L — ABNORMAL LOW (ref 20–29)
Calcium: 9.9 mg/dL (ref 8.7–10.3)
Chloride: 105 mmol/L (ref 96–106)
Creatinine, Ser: 2.54 mg/dL — ABNORMAL HIGH (ref 0.57–1.00)
GFR calc Af Amer: 20 mL/min/{1.73_m2} — ABNORMAL LOW (ref >59)
GFR calc non Af Amer: 17 mL/min/{1.73_m2} — ABNORMAL LOW (ref >59)
Glucose: 96 mg/dL (ref 65–99)
Phosphorus: 4.1 mg/dL (ref 3.0–4.3)
Potassium: 5.5 mmol/L — ABNORMAL HIGH (ref 3.5–5.2)
Sodium: 136 mmol/L (ref 134–144)

## 2019-07-13 LAB — CBC WITH DIFFERENTIAL/PLATELET
Basophils Absolute: 0.1 10*3/uL (ref 0.0–0.2)
Basos: 1 %
EOS (ABSOLUTE): 0.4 10*3/uL (ref 0.0–0.4)
Eos: 3 %
Hematocrit: 33.8 % — ABNORMAL LOW (ref 34.0–46.6)
Hemoglobin: 10.6 g/dL — ABNORMAL LOW (ref 11.1–15.9)
Immature Grans (Abs): 0 10*3/uL (ref 0.0–0.1)
Immature Granulocytes: 0 %
Lymphocytes Absolute: 1.7 10*3/uL (ref 0.7–3.1)
Lymphs: 14 %
MCH: 27.5 pg (ref 26.6–33.0)
MCHC: 31.4 g/dL — ABNORMAL LOW (ref 31.5–35.7)
MCV: 88 fL (ref 79–97)
Monocytes Absolute: 0.7 10*3/uL (ref 0.1–0.9)
Monocytes: 5 %
Neutrophils Absolute: 9.4 10*3/uL — ABNORMAL HIGH (ref 1.4–7.0)
Neutrophils: 77 %
Platelets: 251 10*3/uL (ref 150–450)
RBC: 3.85 x10E6/uL (ref 3.77–5.28)
RDW: 14.8 % (ref 11.7–15.4)
WBC: 12.2 10*3/uL — ABNORMAL HIGH (ref 3.4–10.8)

## 2019-07-18 ENCOUNTER — Telehealth: Payer: Self-pay

## 2019-07-18 NOTE — Telephone Encounter (Signed)
-----   Message from Jerrol Banana., MD sent at 07/14/2019  9:38 AM EDT ----- Sarah [name] high due to dehydration and poor kidney function,,could also be to some bleeding IN GI tract. Make sure pt is taking protonix--also maybe look for blood in stool. F/u 2-4 weeks to check on this.  I just want to make sure pt is comfortable.

## 2019-07-18 NOTE — Telephone Encounter (Signed)
Left message to call back  

## 2019-07-19 NOTE — Telephone Encounter (Signed)
Pt's grandson returned missed call.  Please call [name] back at [phone].  Thanks, [employer]

## 2019-07-20 ENCOUNTER — Ambulatory Visit: Payer: Self-pay | Admitting: Family Medicine

## 2019-07-20 NOTE — Telephone Encounter (Signed)
Results given to grandson. Follow up was already scheduled.

## 2019-07-24 DIAGNOSIS — I129 Hypertensive chronic kidney disease with stage 1 through stage 4 chronic kidney disease, or unspecified chronic kidney disease: Secondary | ICD-10-CM | POA: Insufficient documentation

## 2019-07-25 DIAGNOSIS — N184 Chronic kidney disease, stage 4 (severe): Secondary | ICD-10-CM | POA: Diagnosis not present

## 2019-07-25 DIAGNOSIS — N189 Chronic kidney disease, unspecified: Secondary | ICD-10-CM | POA: Diagnosis not present

## 2019-07-25 DIAGNOSIS — I129 Hypertensive chronic kidney disease with stage 1 through stage 4 chronic kidney disease, or unspecified chronic kidney disease: Secondary | ICD-10-CM | POA: Diagnosis not present

## 2019-07-25 DIAGNOSIS — N179 Acute kidney failure, unspecified: Secondary | ICD-10-CM | POA: Diagnosis not present

## 2019-08-10 ENCOUNTER — Other Ambulatory Visit: Payer: Self-pay | Admitting: Family Medicine

## 2019-08-10 DIAGNOSIS — M25512 Pain in left shoulder: Secondary | ICD-10-CM

## 2019-08-16 NOTE — Progress Notes (Addendum)
Patient: Sarah [name] Female    DOB: 1941-02-09   78 y.o.   MRN: [id number] Visit Date: 08/18/2019  Today's Provider: Wilhemena Durie, MD   Chief Complaint  Patient presents with  . Follow-up  . Weight Check   Subjective:     HPI   Late onset Alzheimer's disease without behavioral disturbance ([place]) From 07/12/2019-Progressive with weight loss. Palliative care discussed. I do not think we are far from Hospice care. - CBC w/Diff/Platelet  Renal artery stenosis ([place]) From 07/12/2019-- Renal Function Panel  Chronic kidney disease (CKD), stage III (moderate) ([place]) From 07/12/2019- Renal Function Panel  Hypertension, renovascular From 07/12/2019- CBC w/Diff/Platelet BP Readings from Last 3 Encounters:  08/18/19 (!) 158/72  07/12/19 (!) 100/58  06/28/19 (!) 86/47   Weight loss From 07/12/2019-10 lbs since last visit. Wt Readings from Last 3 Encounters:  08/18/19 94 lb 6.4 oz (42.8 kg)  07/12/19 85 lb 6.4 oz (38.7 kg)  06/28/19 85 lb (38.6 kg)      Allergies  Allergen Reactions  . Penicillins Hives and Other (See Comments)    Has patient had a PCN reaction causing immediate rash, facial/tongue/throat swelling, SOB or lightheadedness with hypotension: No Has patient had a PCN reaction causing severe rash involving mucus membranes or skin necrosis: No Has patient had a PCN reaction that required hospitalization: No Has patient had a PCN reaction occurring within the last 10 years: No  If all of the above answers are "NO", then may proceed with Cephalosporin use.      Current Outpatient Medications:  .  acetaminophen (TYLENOL) 500 MG tablet, Take 500 mg by mouth at bedtime. , Disp: , Rfl:  .  aspirin EC 81 MG tablet, Take 81 mg by mouth daily., Disp: , Rfl:  .  CALCIUM CARBONATE-VITAMIN D PO, Take 1 tablet by mouth 4 (four) times a week. , Disp: , Rfl:  .  cholecalciferol (VITAMIN D) 1000 units tablet, Take 1,000 Units by mouth 4 (four) times a week. ,  Disp: , Rfl:  .  cyanocobalamin 1000 MCG tablet, Take 1,000 mcg by mouth 4 (four) times a week. , Disp: , Rfl:  .  lisinopril (ZESTRIL) 40 MG tablet, Take 1 tablet by mouth once daily (Patient not taking: Reported on 07/12/2019), Disp: 90 tablet, Rfl: 0 .  pantoprazole (PROTONIX) 40 MG tablet, Take 1 tablet (40 mg total) by mouth daily. (Patient not taking: Reported on 06/23/2019), Disp: 30 tablet, Rfl: 3 .  vitamin C (ASCORBIC ACID) 500 MG tablet, Take 500 mg by mouth daily., Disp: , Rfl:  .  vitamin E 400 UNIT capsule, Take 400 Units by mouth 4 (four) times a week. , Disp: , Rfl:   Review of Systems  Constitutional: Negative for appetite change, chills, fatigue and fever.  HENT: Negative.   Eyes: Negative.   Respiratory: Negative for chest tightness and shortness of breath.   Cardiovascular: Negative for chest pain and palpitations.  Gastrointestinal: Negative for abdominal pain, nausea and vomiting.  Endocrine: Negative.   Allergic/Immunologic: Negative.   Neurological: Negative for dizziness and weakness.  Psychiatric/Behavioral:       Significant dementia without behavioral disturbance    Social History   Tobacco Use  . Smoking status: Current Every Day Smoker    Packs/day: 0.50    Years: 40.00    Pack years: 20.00    Types: Cigarettes  . Smokeless tobacco: Never Used  . Tobacco comment: less than  Substance Use  Topics  . Alcohol use: No    Alcohol/week: 0.0 standard drinks      Objective:   BP (!) 158/72   Pulse (!) 55   Resp 16   Ht 4\' 8"  (1.422 m)   Wt 94 lb 6.4 oz (42.8 kg)   SpO2 97%   BMI 21.16 kg/m  Vitals:   08/18/19 1016  BP: (!) 158/72  Pulse: (!) 55  Resp: 16  SpO2: 97%  Weight: 94 lb 6.4 oz (42.8 kg)  Height: 4\' 8"  (1.422 m)  Body mass index is 21.16 kg/m.   Physical Exam   No results found for any visits on 08/18/19.     Assessment & Plan    1. Weight loss Improved. Patient has gained 9lbs since last visit. Recheck in 3 months.   2.  Late onset Alzheimer's disease without behavioral disturbance ([name]) Family will start getting a healthcare POA in place.  More than 50% 25 minute visit spent in counseling or coordination of care Spent time with patient and grandson discussing appropriate need for power of attorney and healthcare power of attorney for future decision making 3. Hypertension, renovascular Elevated today. Will continue to monitor.   4. ASCVD (arteriosclerotic cardiovascular disease) All risk factors treated.  We have stopped her statin due to progressive dementia.  5. Stage 3 chronic kidney disease, unspecified whether stage 3a or 3b CKD Followed by nephrology.   I, Sarah [name], CMA, am acting as a Education administrator for [employer]. Sarah [name], Sarah [name]    Wilhemena Durie, MD  [facility]

## 2019-08-18 ENCOUNTER — Other Ambulatory Visit: Payer: Self-pay

## 2019-08-18 ENCOUNTER — Ambulatory Visit (INDEPENDENT_AMBULATORY_CARE_PROVIDER_SITE_OTHER): Payer: Medicare Other | Admitting: Family Medicine

## 2019-08-18 ENCOUNTER — Encounter: Payer: Self-pay | Admitting: Family Medicine

## 2019-08-18 VITALS — BP 158/72 | HR 55 | Resp 16 | Ht <= 58 in | Wt 94.4 lb

## 2019-08-18 DIAGNOSIS — I150 Renovascular hypertension: Secondary | ICD-10-CM

## 2019-08-18 DIAGNOSIS — R634 Abnormal weight loss: Secondary | ICD-10-CM | POA: Diagnosis not present

## 2019-08-18 DIAGNOSIS — G301 Alzheimer's disease with late onset: Secondary | ICD-10-CM

## 2019-08-18 DIAGNOSIS — N183 Chronic kidney disease, stage 3 unspecified: Secondary | ICD-10-CM | POA: Diagnosis not present

## 2019-08-18 DIAGNOSIS — I2510 Atherosclerotic heart disease of native coronary artery without angina pectoris: Secondary | ICD-10-CM

## 2019-08-18 DIAGNOSIS — F0280 Dementia in other diseases classified elsewhere without behavioral disturbance: Secondary | ICD-10-CM

## 2019-08-18 NOTE — Patient Instructions (Signed)
Call Velna Hatchet [phone] to start process for Healthcare POA.

## 2019-09-13 ENCOUNTER — Emergency Department: Payer: Medicare Other

## 2019-09-13 ENCOUNTER — Emergency Department
Admission: EM | Admit: 2019-09-13 | Discharge: 2019-09-13 | Disposition: A | Discharge status: Home / Self Care | Payer: Medicare Other | Attending: Emergency Medicine | Admitting: Emergency Medicine

## 2019-09-13 ENCOUNTER — Other Ambulatory Visit: Payer: Self-pay

## 2019-09-13 ENCOUNTER — Encounter: Payer: Self-pay | Admitting: Emergency Medicine

## 2019-09-13 DIAGNOSIS — Y92009 Unspecified place in unspecified non-institutional (private) residence as the place of occurrence of the external cause: Secondary | ICD-10-CM | POA: Insufficient documentation

## 2019-09-13 DIAGNOSIS — Z23 Encounter for immunization: Secondary | ICD-10-CM | POA: Insufficient documentation

## 2019-09-13 DIAGNOSIS — Y999 Unspecified external cause status: Secondary | ICD-10-CM | POA: Diagnosis not present

## 2019-09-13 DIAGNOSIS — Z7982 Long term (current) use of aspirin: Secondary | ICD-10-CM | POA: Insufficient documentation

## 2019-09-13 DIAGNOSIS — Y939 Activity, unspecified: Secondary | ICD-10-CM | POA: Diagnosis not present

## 2019-09-13 DIAGNOSIS — Z79899 Other long term (current) drug therapy: Secondary | ICD-10-CM | POA: Diagnosis not present

## 2019-09-13 DIAGNOSIS — I10 Essential (primary) hypertension: Secondary | ICD-10-CM | POA: Insufficient documentation

## 2019-09-13 DIAGNOSIS — S0101XA Laceration without foreign body of scalp, initial encounter: Secondary | ICD-10-CM

## 2019-09-13 DIAGNOSIS — S0990XA Unspecified injury of head, initial encounter: Secondary | ICD-10-CM | POA: Diagnosis not present

## 2019-09-13 DIAGNOSIS — S199XXA Unspecified injury of neck, initial encounter: Secondary | ICD-10-CM | POA: Diagnosis not present

## 2019-09-13 DIAGNOSIS — F0280 Dementia in other diseases classified elsewhere without behavioral disturbance: Secondary | ICD-10-CM | POA: Insufficient documentation

## 2019-09-13 DIAGNOSIS — G309 Alzheimer's disease, unspecified: Secondary | ICD-10-CM | POA: Insufficient documentation

## 2019-09-13 DIAGNOSIS — W19XXXA Unspecified fall, initial encounter: Secondary | ICD-10-CM

## 2019-09-13 DIAGNOSIS — W0110XA Fall on same level from slipping, tripping and stumbling with subsequent striking against unspecified object, initial encounter: Secondary | ICD-10-CM | POA: Diagnosis not present

## 2019-09-13 LAB — URINALYSIS, COMPLETE (UACMP) WITH MICROSCOPIC
Bilirubin Urine: NEGATIVE
Glucose, UA: NEGATIVE mg/dL
Hgb urine dipstick: NEGATIVE
Ketones, ur: NEGATIVE mg/dL
Leukocytes,Ua: NEGATIVE
Nitrite: NEGATIVE
Protein, ur: 100 mg/dL — AB
Specific Gravity, Urine: 1.01 (ref 1.005–1.030)
Squamous Epithelial / LPF: NONE SEEN (ref 0–5)
pH: 6 (ref 5.0–8.0)

## 2019-09-13 MED ORDER — TETANUS-DIPHTH-ACELL PERTUSSIS 5-2.5-18.5 LF-MCG/0.5 IM SUSP
0.5000 mL | Freq: Once | INTRAMUSCULAR | Status: AC
  Administered 2019-09-13: 22:00:00 0.5 mL via INTRAMUSCULAR
  Filled 2019-09-13: qty 0.5

## 2019-09-13 MED ORDER — LIDOCAINE HCL (PF) 1 % IJ SOLN
5.0000 mL | Freq: Once | INTRAMUSCULAR | Status: AC
  Administered 2019-09-13: 5 mL via INTRADERMAL
  Filled 2019-09-13: qty 5
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
## 2019-09-13 NOTE — Discharge Instructions (Addendum)
Your CT scans of the head and neck were okay today.  A urine test was also normal and did not show any signs of urine infection.  We gave you a tetanus shot today and closed the scalp laceration with 4 staples.

## 2019-09-13 NOTE — ED Notes (Signed)
Patient transported to CT 
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
## 2019-09-13 NOTE — ED Notes (Signed)
Pt back from triage by wheelchair, grandson at bedside. Reports son found her sitting up on the floor of the patio with head bleeding. Pt has dementia and grandson reports her mental status today is baseline for her. When asked anything pt states "I want to go home".

## 2019-09-13 NOTE — ED Provider Notes (Signed)
[facility] Emergency Department Provider Note  ____________________________________________  Time seen: Approximately 9:25 PM  I have reviewed the triage vital signs and the nursing notes.   HISTORY  Chief Complaint Head Injury    Level 5 Caveat: Portions of the History and Physical including HPI and review of systems are unable to be completely obtained due to patient being a poor historian   HPI Sarah [name] is a 78 y.o. female with a history of severe Alzheimer's dementia, hypertension who was brought to the ED due to a fall at home.  She hit the back of her head resulting in bleeding.  She is not on blood thinners.  Patient is otherwise at her baseline mental status, disoriented, not able to provide any meaningful history.  No other pain complaints.  She states that she wants to go home.   Prior to the fall the patient was in her usual state of health.   Past Medical History:  Diagnosis Date  . Arthritis   . Dementia ([place])    ALZHEIMERS  . Ear anomaly    INNER EAR ISSUES  . Hyperlipidemia   . Hypertension      Patient Active Problem List   Diagnosis Date Noted  . Mesenteric artery stenosis ([place]) 03/24/2019  . Renal artery stenosis ([place]) 01/26/2017  . Personal history of tobacco use, presenting hazards to health 06/23/2016  . Depression 05/29/2015  . Dementia ([place]) 05/29/2015  . Allergic rhinitis 03/05/2015  . Weak pulse 03/05/2015  . Smokes tobacco daily 03/05/2015  . Anxiety, generalized 03/05/2015  . Acid reflux 03/05/2015  . HLD (hyperlipidemia) 03/05/2015  . Affective disorder, major 03/05/2015  . Bad memory 03/05/2015  . Arthritis, degenerative 03/05/2015  . OP (osteoporosis) 03/05/2015  . Hypertension, renovascular 03/05/2015  . Scoliosis 03/05/2015  . Avitaminosis D 03/05/2015  . Weight loss 03/05/2015  . Family history of colon cancer 11/07/2014     Past Surgical History:  Procedure Laterality Date  . APPENDECTOMY     . CATARACT EXTRACTION W/PHACO Right 03/09/2018   Procedure: CATARACT EXTRACTION PHACO AND INTRAOCULAR LENS PLACEMENT (IOC);  Surgeon: Birder Robson, MD;  Location: [facility] ORS;  Service: Ophthalmology;  Laterality: Right;  [place] 00:38.9 AP% 15.9 CDE 6.19 FLUID PACK LOT # [id number]  . CATARACT EXTRACTION W/PHACO Left 06/22/2018   Procedure: CATARACT EXTRACTION PHACO AND INTRAOCULAR LENS PLACEMENT (IOC);  Surgeon: Birder Robson, MD;  Location: [facility] ORS;  Service: Ophthalmology;  Laterality: Left;  [place] 00:50 AP% 16.5 CDE 8.25 Fluid pack lot # [id number]  . CESAREAN SECTION    . CHOLECYSTECTOMY    . CYSTOSCOPY WITH STENT PLACEMENT       Prior to Admission medications   Medication Sig Start Date End Date Taking? Authorizing Provider  acetaminophen (TYLENOL) 500 MG tablet Take 500 mg by mouth at bedtime.     [provider]  aspirin EC 81 MG tablet Take 81 mg by mouth daily.    [provider]  CALCIUM CARBONATE-VITAMIN D PO Take 1 tablet by mouth 4 (four) times a week.     [provider]  cholecalciferol (VITAMIN D) 1000 units tablet Take 1,000 Units by mouth 4 (four) times a week.     [provider]  cyanocobalamin 1000 MCG tablet Take 1,000 mcg by mouth 4 (four) times a week.     [provider]  lisinopril (ZESTRIL) 40 MG tablet Take 1 tablet by mouth once daily Patient not taking: Reported on 07/12/2019 04/23/19  Jerrol Banana., MD  pantoprazole (PROTONIX) 40 MG tablet Take 1 tablet (40 mg total) by mouth daily. Patient not taking: Reported on 06/23/2019 06/14/18   Jerrol Banana., MD  vitamin C (ASCORBIC ACID) 500 MG tablet Take 500 mg by mouth daily.    [provider]  vitamin E 400 UNIT capsule Take 400 Units by mouth 4 (four) times a week.     [provider]     Allergies Penicillins   Family History  Problem Relation Age of Onset  . Colon cancer Father   . Pancreatic cancer Sister   . GER disease  Son   . Alzheimer's disease Sister     Social History Social History   Tobacco Use  . Smoking status: Current Every Day Smoker    Packs/day: 0.50    Years: 40.00    Pack years: 20.00    Types: Cigarettes  . Smokeless tobacco: Never Used  . Tobacco comment: less than  Substance Use Topics  . Alcohol use: No    Alcohol/week: 0.0 standard drinks  . Drug use: No    Review of Systems Level 5 Caveat: Portions of the History and Physical including HPI and review of systems are unable to be completely obtained due to patient being a poor historian   Constitutional:   No known fever.  ENT:   No rhinorrhea. Cardiovascular:   No chest pain or syncope. Respiratory:   No dyspnea or cough. Gastrointestinal:   Negative for abdominal pain, vomiting and diarrhea.  Musculoskeletal:   Negative for focal pain or swelling ____________________________________________   PHYSICAL EXAM:  VITAL SIGNS: ED Triage Vitals  Enc Vitals Group     BP 09/13/19 1811 (!) 180/82     Pulse Rate 09/13/19 1811 (!) 59     Resp 09/13/19 1811 16     Temp 09/13/19 1811 97.8 F (36.6 C)     Temp Source 09/13/19 1811 Oral     SpO2 09/13/19 1811 100 %     Weight 09/13/19 1812 94 lb (42.6 kg)     Height 09/13/19 1812 4\' 11"  (1.499 m)     Head Circumference --      Peak Flow --      Pain Score --      Pain Loc --      Pain Edu? --      Excl. in [place]? --     Vital signs reviewed, nursing assessments reviewed.   Constitutional: Awake and alert, not oriented. Non-toxic appearance. Eyes:   Conjunctivae are normal. EOMI. PERRL. ENT      Head:   Normocephalic with 4 cm curvilinear laceration to posterior parietal scalp.  No pulsatile bleeding..      Nose:   No congestion/rhinnorhea.       Mouth/Throat:   MMM, no pharyngeal erythema. No peritonsillar mass.       Neck:   No meningismus. Full ROM. Hematological/Lymphatic/Immunilogical:   No cervical lymphadenopathy. Cardiovascular:   RRR. Symmetric bilateral  radial  pulses.   Cap refill less than 2 seconds. Respiratory:   Unlabored breathing Musculoskeletal:   Normal range of motion in all extremities. No joint effusions.  No lower extremity tenderness.  No edema. Neurologic:   Normal speech, baseline language Motor grossly intact. At baseline mental status for chronic Alzheimer's dementia according to grandson at bedside No acute focal neurologic deficits are appreciated.  Skin:    Skin is warm, dry and intact except for scalp wound  as above. No rash noted.  No petechiae, purpura, or bullae.  ____________________________________________    LABS (pertinent positives/negatives) (all labs ordered are listed, but only abnormal results are displayed) Labs Reviewed  URINALYSIS, COMPLETE (UACMP) WITH MICROSCOPIC - Abnormal; Notable for the following components:      Result Value   Color, Urine STRAW (*)    APPearance CLEAR (*)    Protein, ur 100 (*)    Bacteria, UA RARE (*)    All other components within normal limits   ____________________________________________   EKG    ____________________________________________    RADIOLOGY  Ct Head Wo Contrast  Result Date: 09/13/2019 CLINICAL DATA:  Dementia unwitnessed fall EXAM: CT HEAD WITHOUT CONTRAST CT CERVICAL SPINE WITHOUT CONTRAST TECHNIQUE: Multidetector CT imaging of the head and cervical spine was performed following the standard protocol without intravenous contrast. Multiplanar CT image reconstructions of the cervical spine were also generated. COMPARISON:  CT brain 05/31/2017 FINDINGS: CT HEAD FINDINGS Brain: No acute territorial infarction, hemorrhage, or intracranial mass. Atrophy. Moderate hypodensity in the white matter consistent with chronic small vessel ischemic change, progressed as compared with prior CT. Enlarged ventricles felt secondary to atrophy. Vascular: No hyperdense vessels.  Carotid vascular calcification Skull: Normal. Negative for fracture or focal lesion.  Sinuses/Orbits: No acute finding. Other: Small left posterior parietal scalp hematoma CT CERVICAL SPINE FINDINGS Alignment: Trace anterolisthesis C3 on C4, C4 on C5 and C5 on C6. Facet alignment is maintained Skull base and vertebrae: No acute fracture. No primary bone lesion or focal pathologic process. Soft tissues and spinal canal: No prevertebral fluid or swelling. No visible canal hematoma. Disc levels: Mild degenerative changes C5-C6 and C6-C7. Multiple level posterior facet degenerative change. Upper chest: Emphysema at the apices Other: None IMPRESSION: 1. No CT evidence for acute intracranial abnormality. Moderate hypodensity in the white matter, consistent with chronic small vessel ischemic change, progressed as compared with previous exam. Atrophy 2. No acute osseous abnormality of the cervical spine Electronically Signed   By: Donavan Foil M.D.   On: 09/13/2019 19:30    ____________________________________________   PROCEDURES .Marland KitchenLaceration Repair  Date/Time: 09/13/2019 9:28 PM Performed by: Carrie Mew, MD Authorized by: Carrie Mew, MD   Consent:    Consent obtained:  Verbal   Consent given by: [name].   Risks discussed:  Infection, pain, retained foreign body, poor cosmetic result and poor wound healing   Alternatives discussed:  Delayed treatment, referral and no treatment Anesthesia (see MAR for exact dosages):    Anesthesia method:  Local infiltration   Local anesthetic:  Lidocaine 1% w/o epi Laceration details:    Location:  Scalp   Scalp location:  Occipital   Length (cm):  4 Repair type:    Repair type:  Simple Pre-procedure details:    Preparation:  Patient was prepped and draped in usual sterile fashion and imaging obtained to evaluate for foreign bodies Exploration:    Hemostasis achieved with:  Direct pressure   Wound exploration: entire depth of wound probed and visualized     Wound extent: no foreign bodies/material noted, no muscle damage  noted, no underlying fracture noted and no vascular damage noted     Contaminated: no   Treatment:    Area cleansed with:  Saline   Amount of cleaning:  Extensive   Irrigation solution:  Sterile saline   Irrigation method:  Pressure wash   Visualized foreign bodies/material removed: no   Skin repair:    Repair method:  Staples   Number  of staples:  4 Approximation:    Approximation:  Close Post-procedure details:    Dressing:  Sterile dressing   Patient tolerance of procedure:  Tolerated well, no immediate complications    ____________________________________________  DIFFERENTIAL DIAGNOSIS   Intracranial hemorrhage, C-spine fracture  CLINICAL IMPRESSION / ASSESSMENT AND PLAN / ED COURSE  Medications ordered in the ED: Medications  lidocaine (PF) (XYLOCAINE) 1 % injection 5 mL (has no administration in time range)  Tdap (BOOSTRIX) injection 0.5 mL (has no administration in time range)    Pertinent labs & imaging results that were available during my care of the patient were reviewed by me and considered in my medical decision making (see chart for details).   AKIBA MELFI was evaluated in Emergency Department on 09/13/2019 for the symptoms described in the history of present illness. She was evaluated in the context of the global COVID-19 pandemic, which necessitated consideration that the patient might be at risk for infection with the SARS-CoV-2 virus that causes COVID-19. Institutional protocols and algorithms that pertain to the evaluation of patients at risk for COVID-19 are in a state of rapid change based on information released by regulatory bodies including the CDC and federal and state organizations. These policies and algorithms were followed during the patient's care in the ED.   Patient presents with scalp laceration after a fall at home.  CT head and neck unremarkable.  Urinalysis normal.  In discussion with grandson, he feels that wound repair with staples would  be most appropriate for the patient due to her limited ability to participate in the procedure.  This was completed uneventfully.  They will follow up with primary care in about a week for reassessment and staple removal.  Clinical Course as of Sep 12 2124  Tue Sep 13, 2019  2053 CT head/neck impression: IMPRESSION: 1. No CT evidence for acute intracranial abnormality. Moderate hypodensity in the white matter, consistent with chronic small vessel ischemic change, progressed as compared with previous exam. Atrophy 2. No acute osseous abnormality of the cervical spine   [PS]    Clinical Course User Index [PS] Carrie Mew, MD     ____________________________________________   FINAL CLINICAL IMPRESSION(S) / ED DIAGNOSES    Final diagnoses:  Laceration of scalp, initial encounter  Fall, initial encounter     ED Discharge Orders    None      Portions of this note were generated with dragon dictation software. Dictation errors may occur despite best attempts at proofreading.   Carrie Mew, MD 09/13/19 2129

## 2019-09-14 NOTE — Progress Notes (Signed)
Patient: Sarah [name] Female    DOB: Sep 05, 1941   78 y.o.   MRN: [id number] Visit Date: 09/14/2019  Today's Provider: Wilhemena Durie, MD   No chief complaint on file.  Subjective:     Injury The incident occurred 2 days ago. The injury mechanism was a fall. There is an injury to the head. Pertinent negatives include no abdominal pain, chest pain, nausea, vomiting or weakness. There have been no prior injuries to these areas. Her tetanus status is UTD.      Follow up ER visit  Patient was seen in ER for Fall and Head Injury on 09/13/19 She was treated for CT head scan, labs, Staples #4. Treatment for this included Staples #4 and advised to see pcp in 1 week. She reports good compliance with treatment. She reports this condition is Improved.  ------------------------------------------------------------------------------------    Allergies  Allergen Reactions  . Penicillins Hives and Other (See Comments)    Has patient had a PCN reaction causing immediate rash, facial/tongue/throat swelling, SOB or lightheadedness with hypotension: No Has patient had a PCN reaction causing severe rash involving mucus membranes or skin necrosis: No Has patient had a PCN reaction that required hospitalization: No Has patient had a PCN reaction occurring within the last 10 years: No  If all of the above answers are "NO", then may proceed with Cephalosporin use.      Current Outpatient Medications:  .  acetaminophen (TYLENOL) 500 MG tablet, Take 500 mg by mouth at bedtime. , Disp: , Rfl:  .  aspirin EC 81 MG tablet, Take 81 mg by mouth daily., Disp: , Rfl:  .  CALCIUM CARBONATE-VITAMIN D PO, Take 1 tablet by mouth 4 (four) times a week. , Disp: , Rfl:  .  cholecalciferol (VITAMIN D) 1000 units tablet, Take 1,000 Units by mouth 4 (four) times a week. , Disp: , Rfl:  .  cyanocobalamin 1000 MCG tablet, Take 1,000 mcg by mouth 4 (four) times a week. , Disp: , Rfl:  .  lisinopril  (ZESTRIL) 40 MG tablet, Take 1 tablet by mouth once daily (Patient not taking: Reported on 07/12/2019), Disp: 90 tablet, Rfl: 0 .  pantoprazole (PROTONIX) 40 MG tablet, Take 1 tablet (40 mg total) by mouth daily. (Patient not taking: Reported on 06/23/2019), Disp: 30 tablet, Rfl: 3 .  vitamin C (ASCORBIC ACID) 500 MG tablet, Take 500 mg by mouth daily., Disp: , Rfl:  .  vitamin E 400 UNIT capsule, Take 400 Units by mouth 4 (four) times a week. , Disp: , Rfl:   Review of Systems  Constitutional: Negative for appetite change, chills, fatigue and fever.  Respiratory: Negative for chest tightness and shortness of breath.   Cardiovascular: Negative for chest pain and palpitations.  Gastrointestinal: Negative for abdominal pain, nausea and vomiting.  Skin:       Healing laceration of left posterior scalp with staples in place.  Neurological: Negative for dizziness and weakness.       Dementia.  Psychiatric/Behavioral: Negative.     Social History   Tobacco Use  . Smoking status: Current Every Day Smoker    Packs/day: 0.50    Years: 40.00    Pack years: 20.00    Types: Cigarettes  . Smokeless tobacco: Never Used  . Tobacco comment: less than  Substance Use Topics  . Alcohol use: No    Alcohol/week: 0.0 standard drinks      Objective:   There were no  vitals taken for this visit. There were no vitals filed for this visit.There is no height or weight on file to calculate BMI.   Physical Exam   No results found for any visits on 09/15/19.     Assessment & Plan    1. Fall, subsequent encounter With mild concussion.  Return next week to have staples removed from posterior head - Ambulatory referral to Physical Therapy  2. Late onset Alzheimer's disease without behavioral disturbance (HCC) Progressive  3. Chronic kidney disease due to hypertension   4. Hyperlipidemia, unspecified hyperlipidemia type        Wilhemena Durie, MD  [facility]

## 2019-09-15 ENCOUNTER — Encounter: Payer: Self-pay | Admitting: Family Medicine

## 2019-09-15 ENCOUNTER — Other Ambulatory Visit: Payer: Self-pay

## 2019-09-15 ENCOUNTER — Ambulatory Visit (INDEPENDENT_AMBULATORY_CARE_PROVIDER_SITE_OTHER): Payer: Medicare Other | Admitting: Family Medicine

## 2019-09-15 VITALS — BP 175/68 | HR 65 | Temp 97.3°F | Resp 16 | Ht 59.0 in | Wt 100.3 lb

## 2019-09-15 DIAGNOSIS — E785 Hyperlipidemia, unspecified: Secondary | ICD-10-CM

## 2019-09-15 DIAGNOSIS — I129 Hypertensive chronic kidney disease with stage 1 through stage 4 chronic kidney disease, or unspecified chronic kidney disease: Secondary | ICD-10-CM | POA: Diagnosis not present

## 2019-09-15 DIAGNOSIS — F0280 Dementia in other diseases classified elsewhere without behavioral disturbance: Secondary | ICD-10-CM

## 2019-09-15 DIAGNOSIS — W19XXXD Unspecified fall, subsequent encounter: Secondary | ICD-10-CM

## 2019-09-15 DIAGNOSIS — G301 Alzheimer's disease with late onset: Secondary | ICD-10-CM

## 2019-09-23 ENCOUNTER — Encounter: Payer: Self-pay | Admitting: Adult Health

## 2019-09-23 ENCOUNTER — Ambulatory Visit (INDEPENDENT_AMBULATORY_CARE_PROVIDER_SITE_OTHER): Payer: Medicare Other | Admitting: Adult Health

## 2019-09-23 ENCOUNTER — Other Ambulatory Visit: Payer: Self-pay

## 2019-09-23 VITALS — BP 161/70 | HR 64 | Temp 97.9°F | Resp 16 | Wt 100.8 lb

## 2019-09-23 DIAGNOSIS — Z87898 Personal history of other specified conditions: Secondary | ICD-10-CM

## 2019-09-23 DIAGNOSIS — Z4802 Encounter for removal of sutures: Secondary | ICD-10-CM | POA: Diagnosis not present

## 2019-09-23 NOTE — Progress Notes (Addendum)
Patient: Sarah [name] Female    DOB: 22-May-1941   78 y.o.   MRN: [id number] Visit Date: 09/23/2019  Today's Provider: Marcille Buffy, FNP   Chief Complaint  Patient presents with  . Follow-up   Subjective:     Suture / Staple Removal The sutures were placed 7 to 10 days ago. She tried a wound recheck since the wound repair. There has been no drainage from the wound. The pain has improved.   Since grandson is [name] is with her today for follow-up.  Patient has been doing well he reports, she has had no more falls.  He denies her having any nausea, vomiting, blood in her stools, headaches or any complaints of pain.  Denies any new onset of confusion she is at her baseline he verbalizes with her Alzheimer's.No change with head injury.   [name] does have concerns that she continues to have some mild dizziness that she has had intermittently for a while now.  This was even prior to her fall. He reports she used to take meclizine, but this was discontinued. He denies any other symptoms or concerns at this time.  Patient  denies any fever, body aches,chills, rash, chest pain, shortness of breath, nausea, vomiting, or diarrhea.     Allergies  Allergen Reactions  . Penicillins Hives and Other (See Comments)    Has patient had a PCN reaction causing immediate rash, facial/tongue/throat swelling, SOB or lightheadedness with hypotension: No Has patient had a PCN reaction causing severe rash involving mucus membranes or skin necrosis: No Has patient had a PCN reaction that required hospitalization: No Has patient had a PCN reaction occurring within the last 10 years: No  If all of the above answers are "NO", then may proceed with Cephalosporin use.      Current Outpatient Medications:  .  acetaminophen (TYLENOL) 500 MG tablet, Take 500 mg by mouth at bedtime. , Disp: , Rfl:  .  aspirin EC 81 MG tablet, Take 81 mg by mouth daily., Disp: , Rfl:  .  CALCIUM  CARBONATE-VITAMIN D PO, Take 1 tablet by mouth 4 (four) times a week. , Disp: , Rfl:  .  cholecalciferol (VITAMIN D) 1000 units tablet, Take 1,000 Units by mouth 4 (four) times a week. , Disp: , Rfl:  .  Cobalamin Combinations (FOLTRATE) 500-1 MCG-MG TABS, Take by mouth., Disp: , Rfl:  .  cyanocobalamin 1000 MCG tablet, Take 1,000 mcg by mouth 4 (four) times a week. , Disp: , Rfl:  .  Probiotic CAPS, Take 1 capsule by mouth daily., Disp: , Rfl:  .  saccharomyces boulardii (FLORASTOR) 250 MG capsule, Take by mouth., Disp: , Rfl:  .  vitamin C (ASCORBIC ACID) 500 MG tablet, Take 500 mg by mouth daily., Disp: , Rfl:  .  vitamin E 400 UNIT capsule, Take 400 Units by mouth 4 (four) times a week. , Disp: , Rfl:  .  lisinopril (ZESTRIL) 40 MG tablet, Take 1 tablet by mouth once daily (Patient not taking: Reported on 07/12/2019), Disp: 90 tablet, Rfl: 0 .  pantoprazole (PROTONIX) 40 MG tablet, Take 1 tablet (40 mg total) by mouth daily. (Patient not taking: Reported on 06/23/2019), Disp: 30 tablet, Rfl: 3  Review of Systems  Constitutional: Negative.  Negative for activity change, appetite change, chills, diaphoresis, fatigue, fever and unexpected weight change.  HENT: Negative.   Respiratory: Negative.   Cardiovascular: Negative.   Skin: Positive for wound (occipital scalp- SEE  ER visit history. ). Negative for color change, pallor and rash.  Neurological: Positive for dizziness (not new ). Negative for tremors, seizures, syncope, facial asymmetry, speech difficulty, weakness, light-headedness, numbness and headaches.  Psychiatric/Behavioral:       Late onset Alzheimer's.    Social History   Tobacco Use  . Smoking status: Current Every Day Smoker    Packs/day: 0.50    Years: 40.00    Pack years: 20.00    Types: Cigarettes  . Smokeless tobacco: Never Used  . Tobacco comment: less than  Substance Use Topics  . Alcohol use: No    Alcohol/week: 0.0 standard drinks      Objective:  Blood  pressure (!) 161/70, pulse 64, temperature 97.9 F (36.6 C), temperature source Temporal, resp. rate 16, weight 100 lb 12.8 oz (45.7 kg).  Physical Exam Constitutional:      General: She is not in acute distress.    Appearance: Normal appearance. She is not ill-appearing, toxic-appearing or diaphoretic.  HENT:     Head: Normocephalic and atraumatic.     Mouth/Throat:     Mouth: Mucous membranes are moist.     Pharynx: No oropharyngeal exudate.  Neck:     Musculoskeletal: Normal range of motion.  Cardiovascular:     Rate and Rhythm: Normal rate and regular rhythm.  Pulmonary:     Effort: Pulmonary effort is normal. No respiratory distress.     Breath sounds: Normal breath sounds. No stridor. No wheezing, rhonchi or rales.  Chest:     Chest wall: No tenderness.  Abdominal:     Palpations: Abdomen is soft.  Skin:    General: Skin is warm.     Capillary Refill: Capillary refill takes less than 2 seconds.     Comments: Occipital scalp wound, with 4 staples present, scab present, mild tenderness, no surrounding erythema, well approximated and healing.  Cleaned area and removed 4 staples without difficulty, scant bleeding in area of staple removal, hemostasis easily gained. No discharge, or erythema. Appears to be healing well.   Neurological:     Mental Status: She is alert and oriented to person, place, and time.  Psychiatric:        Mood and Affect: Mood normal.      No results found for any visits on 09/23/19.     Assessment & Plan    1. Encounter for staple removal Tolerated without difficulty.  Discussed signs and symptoms of infection and when to call the office or seek care.    2. History of dizziness Discussed due to side effects and her age not restarting Meclizine. Recommend hydration given patients previous renal function.  - CBC with Differential/Platelet 005009 - Comprehensive Metabolic Panel (CMET) Discussed RED Flags and also getting up slowly and with  assistance.      Return in about 1 month (around 10/23/2019), or if symptoms worsen or fail to improve, for Go to Emergency room/ urgent care if worse, at any time for any worsening symptoms.  Advised patient/ grandson to call the office  for an appointment if no improvement within 72 hours or if any symptoms change or worsen at any time  Advised ER or urgent Care if after hours or on weekend. Call 911 for emergency symptoms at any time.Patinet verbalized understanding of all instructions given/reviewed and treatment plan and has no further questions or concerns at this time.      Marcille Buffy, [facility]

## 2019-09-23 NOTE — Patient Instructions (Signed)
Wound Care, Adult Taking care of your wound properly can help to prevent pain, infection, and scarring. It can also help your wound to heal more quickly. How to care for your wound Wound care      Follow instructions from your health care provider about how to take care of your wound. Make sure you: ? Wash your hands with soap and water before you change the bandage (dressing). If soap and water are not available, use hand sanitizer. ? Change your dressing as told by your health care provider. ? Leave stitches (sutures), skin glue, or adhesive strips in place. These skin closures may need to stay in place for 2 weeks or longer. If adhesive strip edges start to loosen and curl up, you may trim the loose edges. Do not remove adhesive strips completely unless your health care provider tells you to do that.  Check your wound area every day for signs of infection. Check for: ? Redness, swelling, or pain. ? Fluid or blood. ? Warmth. ? Pus or a bad smell.  Ask your health care provider if you should clean the wound with mild soap and water. Doing this may include: ? Using a clean towel to pat the wound dry after cleaning it. Do not rub or scrub the wound. ? Applying a cream or ointment. Do this only as told by your health care provider. ? Covering the incision with a clean dressing.  Ask your health care provider when you can leave the wound uncovered.  Keep the dressing dry until your health care provider says it can be removed. Do not take baths, swim, use a hot tub, or do anything that would put the wound underwater until your health care provider approves. Ask your health care provider if you can take showers. You may only be allowed to take sponge baths. Medicines   If you were prescribed an antibiotic medicine, cream, or ointment, take or use the antibiotic as told by your health care provider. Do not stop taking or using the antibiotic even if your condition improves.  Take  over-the-counter and prescription medicines only as told by your health care provider. If you were prescribed pain medicine, take it 30 or more minutes before you do any wound care or as told by your health care provider. General instructions  Return to your normal activities as told by your health care provider. Ask your health care provider what activities are safe.  Do not scratch or pick at the wound.  Do not use any products that contain nicotine or tobacco, such as cigarettes and e-cigarettes. These may delay wound healing. If you need help quitting, ask your health care provider.  Keep all follow-up visits as told by your health care provider. This is important.  Eat a diet that includes protein, vitamin A, vitamin C, and other nutrient-rich foods to help the wound heal. ? Foods rich in protein include meat, dairy, beans, nuts, and other sources. ? Foods rich in vitamin A include carrots and dark green, leafy vegetables. ? Foods rich in vitamin C include citrus, tomatoes, and other fruits and vegetables. ? Nutrient-rich foods have protein, carbohydrates, fat, vitamins, or minerals. Eat a variety of healthy foods including vegetables, fruits, and whole grains. Contact a health care provider if:  You received a tetanus shot and you have swelling, severe pain, redness, or bleeding at the injection site.  Your pain is not controlled with medicine.  You have redness, swelling, or pain around the wound.  You have fluid or blood coming from the wound.  Your wound feels warm to the touch.  You have pus or a bad smell coming from the wound.  You have a fever or chills.  You are nauseous or you vomit.  You are dizzy. Get help right away if:  You have a red streak going away from your wound.  The edges of the wound open up and separate.  Your wound is bleeding, and the bleeding does not stop with gentle pressure.  You have a rash.  You faint.  You have trouble breathing.  Summary  Always wash your hands with soap and water before changing your bandage (dressing).  To help with healing, eat foods that are rich in protein, vitamin A, vitamin C, and other nutrients.  Check your wound every day for signs of infection. Contact your health care provider if you suspect that your wound is infected. This information is not intended to replace advice given to you by your health care provider. Make sure you discuss any questions you have with your health care provider. Document Released: 07/22/2008 Document Revised: 01/31/2019 Document Reviewed: 04/29/2016 Elsevier Patient Education  [employer]. Dizziness Dizziness is a common problem. It makes you feel unsteady or light-headed. You may feel like you are about to pass out (faint). Dizziness can lead to getting hurt if you stumble or fall. Dizziness can be caused by many things, including:  Medicines.  Not having enough water in your body (dehydration).  Illness. Follow these instructions at home: Eating and drinking   Drink enough fluid to keep your pee (urine) clear or pale yellow. This helps to keep you from getting dehydrated. Try to drink more clear fluids, such as water.  Do not drink alcohol.  Limit how much caffeine you drink or eat, if your doctor tells you to do that.  Limit how much salt (sodium) you drink or eat, if your doctor tells you to do that. Activity   Avoid making quick movements. ? When you stand up from sitting in a chair, steady yourself until you feel okay. ? In the morning, first sit up on the side of the bed. When you feel okay, stand slowly while you hold onto something. Do this until you know that your balance is fine.  If you need to stand in one place for a long time, move your legs often. Tighten and relax the muscles in your legs while you are standing.  Do not drive or use heavy machinery if you feel dizzy.  Avoid bending down if you feel dizzy. Place items in  your home so you can reach them easily without leaning over. Lifestyle  Do not use any products that contain nicotine or tobacco, such as cigarettes and e-cigarettes. If you need help quitting, ask your doctor.  Try to lower your stress level. You can do this by using methods such as yoga or meditation. Talk with your doctor if you need help. General instructions  Watch your dizziness for any changes.  Take over-the-counter and prescription medicines only as told by your doctor. Talk with your doctor if you think that you are dizzy because of a medicine that you are taking.  Tell a friend or a family member that you are feeling dizzy. If he or she notices any changes in your behavior, have this person call your doctor.  Keep all follow-up visits as told by your doctor. This is important. Contact a doctor if:  Your dizziness  does not go away.  Your dizziness or light-headedness gets worse.  You feel sick to your stomach (nauseous).  You have trouble hearing.  You have new symptoms.  You are unsteady on your feet.  You feel like the room is spinning. Get help right away if:  You throw up (vomit) or have watery poop (diarrhea), and you cannot eat or drink anything.  You have trouble: ? Talking. ? Walking. ? Swallowing. ? Using your arms, hands, or legs.  You feel generally weak.  You are not thinking clearly, or you have trouble forming sentences. A friend or family member may notice this.  You have: ? Chest pain. ? Pain in your belly (abdomen). ? Shortness of breath. ? Sweating.  Your vision changes.  You are bleeding.  You have a very bad headache.  You have neck pain or a stiff neck.  You have a fever. These symptoms may be an emergency. Do not wait to see if the symptoms will go away. Get medical help right away. Call your local emergency services (911 in the [place] Do not drive yourself to the hospital. Summary  Dizziness makes you feel unsteady or  light-headed. You may feel like you are about to pass out (faint).  Drink enough fluid to keep your pee (urine) clear or pale yellow. Do not drink alcohol.  Avoid making quick movements if you feel dizzy.  Watch your dizziness for any changes. This information is not intended to replace advice given to you by your health care provider. Make sure you discuss any questions you have with your health care provider. Document Released: 10/02/2011 Document Revised: 10/16/2017 Document Reviewed: 10/30/2016 Elsevier Patient Education  2020 [employer].

## 2019-09-27 DIAGNOSIS — Z87898 Personal history of other specified conditions: Secondary | ICD-10-CM | POA: Diagnosis not present

## 2019-09-28 LAB — CBC WITH DIFFERENTIAL/PLATELET
Basophils Absolute: 0.1 10*3/uL (ref 0.0–0.2)
Basos: 1 %
EOS (ABSOLUTE): 0.2 10*3/uL (ref 0.0–0.4)
Eos: 3 %
Hematocrit: 30.9 % — ABNORMAL LOW (ref 34.0–46.6)
Hemoglobin: 9.9 g/dL — ABNORMAL LOW (ref 11.1–15.9)
Immature Grans (Abs): 0 10*3/uL (ref 0.0–0.1)
Immature Granulocytes: 0 %
Lymphocytes Absolute: 1.5 10*3/uL (ref 0.7–3.1)
Lymphs: 18 %
MCH: 29.1 pg (ref 26.6–33.0)
MCHC: 32 g/dL (ref 31.5–35.7)
MCV: 91 fL (ref 79–97)
Monocytes Absolute: 0.5 10*3/uL (ref 0.1–0.9)
Monocytes: 6 %
Neutrophils Absolute: 6 10*3/uL (ref 1.4–7.0)
Neutrophils: 72 %
Platelets: 264 10*3/uL (ref 150–450)
RBC: 3.4 x10E6/uL — ABNORMAL LOW (ref 3.77–5.28)
RDW: 13.2 % (ref 11.7–15.4)
WBC: 8.5 10*3/uL (ref 3.4–10.8)

## 2019-09-28 LAB — COMPREHENSIVE METABOLIC PANEL
ALT: 11 IU/L (ref 0–32)
AST: 14 IU/L (ref 0–40)
Albumin/Globulin Ratio: 1.5 (ref 1.2–2.2)
Albumin: 4.3 g/dL (ref 3.7–4.7)
Alkaline Phosphatase: 65 IU/L (ref 39–117)
BUN/Creatinine Ratio: 41 — ABNORMAL HIGH (ref 12–28)
BUN: 62 mg/dL — ABNORMAL HIGH (ref 8–27)
Bilirubin Total: <0.2 mg/dL (ref 0.0–1.2)
CO2: 22 mmol/L (ref 20–29)
Calcium: 9.8 mg/dL (ref 8.7–10.3)
Chloride: 106 mmol/L (ref 96–106)
Creatinine, Ser: 1.52 mg/dL — ABNORMAL HIGH (ref 0.57–1.00)
GFR calc Af Amer: 37 mL/min/{1.73_m2} — ABNORMAL LOW (ref >59)
GFR calc non Af Amer: 32 mL/min/{1.73_m2} — ABNORMAL LOW (ref >59)
Globulin, Total: 2.9 g/dL (ref 1.5–4.5)
Glucose: 121 mg/dL — ABNORMAL HIGH (ref 65–99)
Potassium: 4.6 mmol/L (ref 3.5–5.2)
Sodium: 142 mmol/L (ref 134–144)
Total Protein: 7.2 g/dL (ref 6.0–8.5)

## 2019-09-30 ENCOUNTER — Telehealth: Payer: Self-pay

## 2019-09-30 NOTE — Telephone Encounter (Signed)
Sarah [name] was advised. KW

## 2019-09-30 NOTE — Progress Notes (Signed)
Please let patient's grandson know, that patient's lab work is considerably stable, mild decrease in hemoglobin from 2 months ago, but relatively stable.  Electrolytes are normal.  Question any  rectal or vaginal bleeding? Kidney function is still decreased, however improved from 2 months ago.  Continue hydration.  Please advise him to call the office at any time should he have any questions or concerns.

## 2019-09-30 NOTE — Telephone Encounter (Signed)
-----   Message from [facility] [place] sent at 09/30/2019 10:50 AM EST -----  ----- Message ----- From: Doreen Beam, FNP Sent: 09/30/2019   8:22 AM EST To: Rosanna Randy Nurse  Please let patient's grandson know, that patient's lab work is considerably stable, mild decrease in hemoglobin from 2 months ago, but relatively stable.  Electrolytes are normal.  Question any  rectal or vaginal bleeding? Kidney function is still decreased, however improved from 2 months ago.  Continue hydration.  Please advise him to call the office at any time should he have any questions or concerns.

## 2019-10-31 ENCOUNTER — Other Ambulatory Visit: Payer: Self-pay

## 2019-10-31 ENCOUNTER — Ambulatory Visit: Payer: Medicare Other | Attending: Family Medicine

## 2019-10-31 DIAGNOSIS — R2681 Unsteadiness on feet: Secondary | ICD-10-CM | POA: Insufficient documentation

## 2019-10-31 DIAGNOSIS — Z9181 History of falling: Secondary | ICD-10-CM | POA: Diagnosis not present

## 2019-10-31 NOTE — Therapy (Signed)
[place] MAIN [facility] SERVICES [address] [place], [place], [address] Phone: [phone]   Fax:  [phone]  Physical Therapy Evaluation  Patient Details  Name: Sarah [name] MRN: [id number] Date of Birth: 30-Jun-1941 Referring Provider (PT): Dr. Rosanna Randy   Encounter Date: 10/31/2019  PT End of Session - 10/31/19 2231    Visit Number  1    Number of Visits  25    Date for PT Re-Evaluation  01/23/20    Authorization Type  eval: 10/31/19    PT Start Time  1115    PT Stop Time  1205    PT Time Calculation (min)  50 min    Equipment Utilized During Treatment  Gait belt    Activity Tolerance  Patient tolerated treatment well    Behavior During Therapy  [facility] for tasks assessed/performed   Confused      Past Medical History:  Diagnosis Date  . Arthritis   . Dementia ([place])    ALZHEIMERS  . Ear anomaly    INNER EAR ISSUES  . Hyperlipidemia   . Hypertension     Past Surgical History:  Procedure Laterality Date  . APPENDECTOMY    . CATARACT EXTRACTION W/PHACO Right 03/09/2018   Procedure: CATARACT EXTRACTION PHACO AND INTRAOCULAR LENS PLACEMENT (IOC);  Surgeon: Birder Robson, MD;  Location: [facility] ORS;  Service: Ophthalmology;  Laterality: Right;  [place] 00:38.9 AP% 15.9 CDE 6.19 FLUID PACK LOT # [id number]  . CATARACT EXTRACTION W/PHACO Left 06/22/2018   Procedure: CATARACT EXTRACTION PHACO AND INTRAOCULAR LENS PLACEMENT (IOC);  Surgeon: Birder Robson, MD;  Location: [facility] ORS;  Service: Ophthalmology;  Laterality: Left;  [place] 00:50 AP% 16.5 CDE 8.25 Fluid pack lot # [id number]  . CESAREAN SECTION    . CHOLECYSTECTOMY    . CYSTOSCOPY WITH STENT PLACEMENT      There were no vitals filed for this visit.   Subjective Assessment - 10/31/19 1122    Subjective  Fall    Pertinent History  Sarah [name] is a 79 y.o. female with a history of severe Alzheimer's dementia, hypertension who was brought to the ED due to a fall at home. She hit the  back of her head resulting in bleeding.  She was treated with staples to close her wound and had a follow-up appointment with her PCP who ordered PT due to fall. Per grandson patient's Alzheimer's has progressed significantly over the last few months. He states that she likes to walk but is unstable. She has had 2 falls in the last 6 months. Per grandson at baseline she is AOx1 and is able to recognize her son and grandson with whom she lives. She does not participate in any regular exercise.    Limitations  Walking    Patient Stated Goals  Improve balance and safety    Currently in Pain?  No/denies   Based on faces, pt unable to report        [facility] PT Assessment - 10/31/19 1123      Assessment   Medical Diagnosis  Fall    Referring Provider (PT)  Dr. Netta Corrigan Domin[place]  Right    Next MD Visit  End of January/Early Feb    Prior Therapy  Family unaware of previous episodes      Precautions   Precautions  Fall      Restrictions   Weight Bearing Restrictions  No      Balance Screen   Has  the patient fallen in the past 6 months  Yes    How many times?  2    Has the patient had a decrease in activity level because of a fear of falling?   Yes    Is the patient reluctant to leave their home because of a fear of falling?   Yes      [place];Other (Comment)   Grandson   Available Help at Discharge  Family    Type of [place] entrance    [place] - single point;Tub bench      Prior Function   Level of Independence  Needs assistance with ADLs    Leisure  Watching television      Cognition   Overall Cognitive Status  History of cognitive impairments - at baseline      Standardized Balance Assessment   Standardized Balance Assessment  Berg Balance Test      Berg Balance Test   Sit to Stand  Able to stand  independently using  hands    Standing Unsupported  Able to stand 2 minutes with supervision    Sitting with Back Unsupported but Feet Supported on Floor or Stool  Able to sit safely and securely 2 minutes    Stand to Sit  Controls descent by using hands    Transfers  Able to transfer with verbal cueing and /or supervision    Standing Unsupported with Eyes Closed  Able to stand 10 seconds with supervision    Standing Unsupported with Feet Together  Needs help to attain position but able to stand for 30 seconds with feet together    From Standing, Reach Forward with Outstretched Arm  Can reach forward >5 cm safely (2")    From Standing Position, Pick up Object from [place] to pick up shoe safely and easily    From Standing Position, Turn to Look Behind Over each Shoulder  Turn sideways only but maintains balance    Turn 360 Degrees  Needs close supervision or verbal cueing    Standing Unsupported, Alternately Place Feet on Step/Stool  Needs assistance to keep from falling or unable to try    Standing Unsupported, One Foot in Front  Needs help to step but can hold 15 seconds    Standing on One Leg  Tries to lift leg/unable to hold 3 seconds but remains standing independently    Total Score  30        SUBJECTIVE Chief complaint: Onset: Sarah [name] is a 79 y.o. female with a history of severe Alzheimer's dementia, hypertension who was brought to the ED due to a fall at home. She hit the back of her head resulting in bleeding.  She was treated with staples to close her wound and had a follow-up appointment with her PCP who ordered PT due to fall. Per grandson patient's Alzheimer's has progressed significantly over the last few months. He states that she likes to walk but is unstable. She has had 2 falls in the last 6 months. Per grandson at baseline she is AOx1 and is able to recognize her son and grandson with whom she lives. She does not participate in any regular exercise.  Recent changes in overall  health/medication: No Prior history of physical therapy for balance: None Follow-up appointment  with MD: End of January/early February  OBJECTIVE  MUSCULOSKELETAL: Tremor: Absent Bulk: Pt is thin and frail Tone: Normal, no clonus  Posture Upper thoracic kyphosis with forward head  Gait Pt ambulates with short, shuffling steps with posterior COM and very slow speed  Strength R/L 5/5 Hip flexion 5/5 Knee extension 5/5 Knee flexion 5/5 Ankle Dorsiflexion 5/5 Shoulder flexion 5/5 Elbow flexion 4/5 Elbow extension Strong grip bilaterally Pt with limited ability to understand commands   NEUROLOGICAL:  Mental Status Patient is oriented to person but disoriented to place and time Recent memory is impaired Remote memory is partially intact but more significantly impaired.  Attention span and concentration are impaired Expressive speech is intact.  Patient's fund of knowledge is within normal limits for educational level.  Sensation Deferred  Reflexes Deferred  Coordination/Cerebellar Deferred  FUNCTIONAL OUTCOME MEASURES   Results Comments  BERG 30/56 Fall risk, in need of intervention  TUG 44.3 seconds Fall risk, in need of intervention  10 Meter Gait Speed Self-selected: 57.4s =  0.17 m/s; Below normative values for full community ambulation, high fall risk       Objective measurements completed on examination: See above findings.              PT Education - 10/31/19 2231    Person(s) Educated  Patient;Other (comment)   grandson   Methods  Explanation;Demonstration;Tactile cues;Verbal cues;Handout    Comprehension  Verbalized understanding;Need further instruction       PT Short Term Goals - 11/01/19 0813      PT SHORT TERM GOAL #1   Title  Pt will be participate with HEP at the direction of her caregiver in order to improve strength and balance in order to decrease fall risk and improve function at home.    Time  6    Period  Weeks     Status  New    Target Date  12/12/19        PT Long Term Goals - 11/01/19 0814      PT LONG TERM GOAL #1   Title  Pt will improve BERG by at least 3 points in order to demonstrate clinically significant improvement in balance.    Baseline  10/31/19: 30/56    Time  12    Period  Weeks    Status  New    Target Date  01/23/20      PT LONG TERM GOAL #2   Title  Pt will decrease TUG to below 14 seconds/decrease in order to demonstrate decreased fall risk.    Baseline  10/31/19: 44.3s    Time  12    Period  Weeks    Status  New    Target Date  01/23/20      PT LONG TERM GOAL #3   Title  Pt will increase 10MWT by at least 0.13 m/s in order to demonstrate clinically significant improvement in community ambulation.    Baseline  10/31/19: Self-selected: 57.4s =  0.17 m/s;    Time  12    Period  Weeks    Status  New    Target Date  01/23/20      PT LONG TERM GOAL #4   Title  Pt will be able to perform sit to stand from a regular height chair without UE support in order to demonstrate improved leg strength and safety    Baseline  10/31/19: Heavy UE dependence with sit to stand transfers    Time  12  Period  Weeks    Status  New    Target Date  01/23/20             Plan - 11/01/19 0818    Clinical Impression Statement  Pt is a pleasant 79 year-old female referred for difficulty with balance s/p fall. Pt with advanced dementia and grandson provided most of history. Examination is limited due to patient's difficulty following commands however it reveals deficits cognition, balance, and strength. She is unable to perform a sit to stand without significant assistance from upper extremities on chair. Her balance is significantly impaired with TUG of 44.3s and BERG of 30/56. She ambulates with short shuffling steps and significant slowing with her 36m gait speed of 0.17 m/s which is significantly below functional requirements for full household ambulation. Pt presents with deficits in  strength, gait and balance. She will benefit from skilled PT services to address deficits in balance and decrease risk for future falls.    Personal Factors and Comorbidities  Age;Comorbidity 3+    Comorbidities  Alzheimer's, anxiety/depression, arthritis    Examination-Activity Limitations  Bathing;Bed Mobility;Caring for Others;Carry;Dressing;Hygiene/Grooming;Lift;Locomotion Level;Squat;Stand;Transfers    Examination-Participation Restrictions  Community Activity;Driving;Interpersonal Relationship;Laundry;Medication Management;Meal Prep;Personal Finances;Shop    Stability/Clinical Decision Making  Unstable/Unpredictable    Clinical Decision Making  High    Rehab Potential  Fair    PT Frequency  2x / week    PT Duration  12 weeks    PT Treatment/Interventions  ADLs/Self Care Home Management;Aquatic Therapy;Biofeedback;Canalith Repostioning;Cryotherapy;Electrical Stimulation;Iontophoresis 4mg /ml Dexamethasone;Moist Heat;Traction;Ultrasound;DME Instruction;Gait training;Stair training;Functional mobility training;Therapeutic activities;Therapeutic exercise;Balance training;Neuromuscular re-education;Cognitive remediation;Patient/family education;Manual techniques;Passive range of motion;Dry needling;Vestibular;Joint Manipulations    PT Next Visit Plan  Balance and strengthening    PT Home Exercise Plan  seated marches, seated clams, seated LAQ, feet together balance, slow marching, sit to stand with minimal UE dependendence    Consulted and Agree with Plan of Care  Family member/caregiver    Family Member Consulted  [name]       Patient will benefit from skilled therapeutic intervention in order to improve the following deficits and impairments:  Abnormal gait, Decreased balance, Decreased cognition, Decreased knowledge of precautions, Decreased safety awareness, Decreased strength, Difficulty walking  Visit Diagnosis: Unsteadiness on feet  History of falling     Problem  List Patient Active Problem List   Diagnosis Date Noted  . Chronic kidney disease due to hypertension 07/24/2019  . Mesenteric artery stenosis ([place]) 03/24/2019  . Renal artery stenosis ([place]) 01/26/2017  . Personal history of tobacco use, presenting hazards to health 06/23/2016  . Depressive disorder 05/29/2015  . Dementia ([place]) 05/29/2015  . Allergic rhinitis 03/05/2015  . Weak pulse 03/05/2015  . Smokes tobacco daily 03/05/2015  . Generalized anxiety disorder 03/05/2015  . Acid reflux 03/05/2015  . HLD (hyperlipidemia) 03/05/2015  . Affective disorder, major 03/05/2015  . Bad memory 03/05/2015  . Arthritis, degenerative 03/05/2015  . OP (osteoporosis) 03/05/2015  . Renovascular hypertension 03/05/2015  . Scoliosis 03/05/2015  . Avitaminosis D 03/05/2015  . Weight loss 11/07/2014  . Family history of colon cancer 11/07/2014   Phillips Grout PT, DPT, GCS  Amontae Ng 11/01/2019, 8:28 AM  [place] MAIN [facility] SERVICES [address] [place], [place], [address] Phone: [phone]   Fax:  [phone]  Name: Sarah [name] MRN: [id number] Date of Birth: Apr 16, 1941

## 2019-11-02 ENCOUNTER — Ambulatory Visit: Payer: Medicare Other | Admitting: Physical Therapy

## 2019-11-07 ENCOUNTER — Ambulatory Visit: Payer: Medicare Other

## 2019-11-09 ENCOUNTER — Ambulatory Visit: Payer: Medicare Other

## 2019-11-14 ENCOUNTER — Ambulatory Visit: Payer: Medicare Other

## 2019-11-16 ENCOUNTER — Other Ambulatory Visit: Payer: Self-pay

## 2019-11-16 ENCOUNTER — Ambulatory Visit: Payer: Medicare Other

## 2019-11-16 DIAGNOSIS — R2681 Unsteadiness on feet: Secondary | ICD-10-CM | POA: Diagnosis not present

## 2019-11-16 DIAGNOSIS — Z9181 History of falling: Secondary | ICD-10-CM | POA: Diagnosis not present

## 2019-11-16 NOTE — Therapy (Signed)
[place] [facility] SERVICES [address] [place], [place], [address] Phone: [phone]   Fax:  [phone]  Physical Therapy Treatment  Patient Details  Name: Sarah [name] MRN: [id number] Date of Birth: 1941-03-16 Referring Provider (PT): Dr. Rosanna Randy   Encounter Date: 11/16/2019  PT End of Session - 11/16/19 1110    Visit Number  2    Number of Visits  25    Date for PT Re-Evaluation  01/23/20    Authorization Type  eval: 10/31/19    PT Start Time  1115    PT Stop Time  1200    PT Time Calculation (min)  45 min    Equipment Utilized During Treatment  Gait belt    Activity Tolerance  Patient tolerated treatment well    Behavior During Therapy  [facility] for tasks assessed/performed   Confused      Past Medical History:  Diagnosis Date  . Arthritis   . Dementia ([place])    ALZHEIMERS  . Ear anomaly    INNER EAR ISSUES  . Hyperlipidemia   . Hypertension     Past Surgical History:  Procedure Laterality Date  . APPENDECTOMY    . CATARACT EXTRACTION W/PHACO Right 03/09/2018   Procedure: CATARACT EXTRACTION PHACO AND INTRAOCULAR LENS PLACEMENT (IOC);  Surgeon: Birder Robson, MD;  Location: [facility] ORS;  Service: Ophthalmology;  Laterality: Right;  [place] 00:38.9 AP% 15.9 CDE 6.19 FLUID PACK LOT # [id number]  . CATARACT EXTRACTION W/PHACO Left 06/22/2018   Procedure: CATARACT EXTRACTION PHACO AND INTRAOCULAR LENS PLACEMENT (IOC);  Surgeon: Birder Robson, MD;  Location: [facility] ORS;  Service: Ophthalmology;  Laterality: Left;  [place] 00:50 AP% 16.5 CDE 8.25 Fluid pack lot # [id number]  . CESAREAN SECTION    . CHOLECYSTECTOMY    . CYSTOSCOPY WITH STENT PLACEMENT      There were no vitals filed for this visit.  Subjective Assessment - 11/16/19 1110    Subjective  Pt is doing well today. No pain reported and no specific questions or concerns from patient or grandson [name]. [name] reports that he has been performing exercises with patient at home.  She occasionally complains of L knee pain especially during sit to stand exercises.    Pertinent History  Sarah [name] is a 79 y.o. female with a history of severe Alzheimer's dementia, hypertension who was brought to the ED due to a fall at home. She hit the back of her head resulting in bleeding.  She was treated with staples to close her wound and had a follow-up appointment with her PCP who ordered PT due to fall. Per grandson patient's Alzheimer's has progressed significantly over the last few months. He states that she likes to walk but is unstable. She has had 2 falls in the last 6 months. Per grandson at baseline she is AOx1 and is able to recognize her son and grandson with whom she lives. She does not participate in any regular exercise.    Limitations  Walking    Patient Stated Goals  Improve balance and safety    Currently in Pain?  No/denies         TREATMENT   Ther-ex  NuStep L0 x 5 minutes BUE/BLE, extensive redirection and cues to continue; BPPV screen: negative Dix-Hallpike and roll tests bilaterally Orthostatic vital signs: Supine: BP: 181/64, HR: 54 SpO2: 100% Sitting: BP: 165/77  HR: 59 SpO2: 100% Standing: BP: 174/75  HR: 70 SpO2: 100%;   Seated marches x  10 bilateral, added 2# ankle weights x 10 bilateral; Seated clams with light manual resistance 2 x 10; Seated adductor squeeze with light manual resistance 2 x 10; Seated LAQ 2# ankle weights 2 x 10 bilateral; Seated HS curls with red tband x 10 bilateral; Sit to stand with BUE handheld support x 10; Standing mini squats with BUE handheld support x 10;   Neuromuscular Re-education  During conversation grandson reports that pt likes Tango music and used to love to dance when she was younger. Therapist danced with patient in order to work on balance. Perform side stepping bilaterally, forward/backward stepping, and 180/360 degree turns to both directions.   Pt educated throughout session about proper posture and  technique with exercises. Improved exercise technique, movement at target joints, use of target muscles after min to mod verbal, visual, tactile cues.    Pt requires repeated redirection during session due to advanced dementia. She requires mod/max tactile and verbal cues as well as demonstration from therapist to complete exercises. During conversation grandson reports that pt likes Tango music and used to love to dance when she was younger. Therapist danced with patient in order to work on balance and pt was very engaged with dancing and singing to the music. Grandson encouraged to utilize music and dance at home for therapy when patient is able/safe to perform. Pt will benefit from PT services to address deficits in strength, balance, and mobility in order to return to full function at home.                         PT Short Term Goals - 11/01/19 0813      PT SHORT TERM GOAL #1   Title  Pt will be participate with HEP at the direction of her caregiver in order to improve strength and balance in order to decrease fall risk and improve function at home.    Time  6    Period  Weeks    Status  New    Target Date  12/12/19        PT Long Term Goals - 11/01/19 0814      PT LONG TERM GOAL #1   Title  Pt will improve BERG by at least 3 points in order to demonstrate clinically significant improvement in balance.    Baseline  10/31/79: 30/56    Time  12    Period  Weeks    Status  New    Target Date  01/23/20      PT LONG TERM GOAL #2   Title  Pt will decrease TUG to below 14 seconds/decrease in order to demonstrate decreased fall risk.    Baseline  10/31/79: 44.3s    Time  12    Period  Weeks    Status  New    Target Date  01/23/20      PT LONG TERM GOAL #3   Title  Pt will increase 10MWT by at least 0.13 m/s in order to demonstrate clinically significant improvement in community ambulation.    Baseline  10/31/79: Self-selected: 57.4s =  0.17 m/s;    Time  12    Period   Weeks    Status  New    Target Date  01/23/20      PT LONG TERM GOAL #4   Title  Pt will be able to perform sit to stand from a regular height chair without UE support in order to demonstrate improved  leg strength and safety    Baseline  10/31/19: Heavy UE dependence with sit to stand transfers    Time  12    Period  Weeks    Status  New    Target Date  01/23/20            Plan - 11/16/19 1110    Clinical Impression Statement  Pt requires repeated redirection during session due to advanced dementia. She requires mod/max tactile and verbal cues as well as demonstration from therapist to complete exercises. During conversation grandson reports that pt likes Tango music and used to love to dance when she was younger. Therapist danced with patient in order to work on balance and pt was very engaged with dancing and singing to the music. Grandson encouraged to utilize music and dance at home for therapy when patient is able/safe to perform. Pt will benefit from PT services to address deficits in strength, balance, and mobility in order to return to full function at home.    Personal Factors and Comorbidities  Age;Comorbidity 3+    Comorbidities  Alzheimer's, anxiety/depression, arthritis    Examination-Activity Limitations  Bathing;Bed Mobility;Caring for Others;Carry;Dressing;Hygiene/Grooming;Lift;Locomotion Level;Squat;Stand;Transfers    Examination-Participation Restrictions  Community Activity;Driving;Interpersonal Relationship;Laundry;Medication Management;Meal Prep;Personal Finances;Shop    Stability/Clinical Decision Making  Unstable/Unpredictable    Rehab Potential  Fair    PT Frequency  2x / week    PT Duration  12 weeks    PT Treatment/Interventions  ADLs/Self Care Home Management;Aquatic Therapy;Biofeedback;Canalith Repostioning;Cryotherapy;Electrical Stimulation;Iontophoresis 4mg /ml Dexamethasone;Moist Heat;Traction;Ultrasound;DME Instruction;Gait training;Stair training;Functional  mobility training;Therapeutic activities;Therapeutic exercise;Balance training;Neuromuscular re-education;Cognitive remediation;Patient/family education;Manual techniques;Passive range of motion;Dry needling;Vestibular;Joint Manipulations    PT Next Visit Plan  Balance and strengthening    PT Home Exercise Plan  seated marches, seated clams, seated LAQ, feet together balance, slow marching, sit to stand with minimal UE dependendence    Consulted and Agree with Plan of Care  Family member/caregiver    Family Member Consulted  [name]       Patient will benefit from skilled therapeutic intervention in order to improve the following deficits and impairments:  Abnormal gait, Decreased balance, Decreased cognition, Decreased knowledge of precautions, Decreased safety awareness, Decreased strength, Difficulty walking  Visit Diagnosis: Unsteadiness on feet  History of falling     Problem List Patient Active Problem List   Diagnosis Date Noted  . Chronic kidney disease due to hypertension 07/24/2019  . Mesenteric artery stenosis ([place]) 03/24/2019  . Renal artery stenosis ([place]) 01/26/2017  . Personal history of tobacco use, presenting hazards to health 06/23/2016  . Depressive disorder 05/29/2015  . Dementia ([place]) 05/29/2015  . Allergic rhinitis 03/05/2015  . Weak pulse 03/05/2015  . Smokes tobacco daily 03/05/2015  . Generalized anxiety disorder 03/05/2015  . Acid reflux 03/05/2015  . HLD (hyperlipidemia) 03/05/2015  . Affective disorder, major 03/05/2015  . Bad memory 03/05/2015  . Arthritis, degenerative 03/05/2015  . OP (osteoporosis) 03/05/2015  . Renovascular hypertension 03/05/2015  . Scoliosis 03/05/2015  . Avitaminosis D 03/05/2015  . Weight loss 11/07/2014  . Family history of colon cancer 11/07/2014   Phillips Grout PT, DPT, GCS  Nelli Swalley 11/16/2019, 1:39 PM  [place] MAIN [facility] SERVICES [address]  [place], [place], [address] Phone: [phone]   Fax:  [phone]  Name: JANEECE BLOK MRN: [id number] Date of Birth: 25-Feb-1941

## 2019-11-18 NOTE — Progress Notes (Signed)
Patient: Sarah [name] Female    DOB: [birth year]   79 y.o.   MRN: [id number] Visit Date: 11/22/2019  Today's Provider: Wilhemena Durie, MD   Chief Complaint  Patient presents with  . Follow-up  . Weight Loss   Subjective:     HPI  Patient has progressive Alzheimer's with recent behavioral disturbance.  She is starting to wander some. Weight loss From 08/18/2019-Improved. Patient has gained 9lbs since last visit. Recheck in 3 months.   Per patient's grandson, she has new dentures. The new dentures are uncomfortable. Patient has been eating soft or liquid foods.    Fall, subsequent encounter From 09/15/2019-With mild concussion. Referred to Physical Therapy  Late onset Alzheimer's disease without behavioral disturbance ([place]) From 09/15/2019-Progressive.   Allergies  Allergen Reactions  . Penicillins Hives and Other (See Comments)    Has patient had a PCN reaction causing immediate rash, facial/tongue/throat swelling, SOB or lightheadedness with hypotension: No Has patient had a PCN reaction causing severe rash involving mucus membranes or skin necrosis: No Has patient had a PCN reaction that required hospitalization: No Has patient had a PCN reaction occurring within the last 10 years: No  If all of the above answers are "NO", then may proceed with Cephalosporin use.      Current Outpatient Medications:  .  acetaminophen (TYLENOL) 500 MG tablet, Take 500 mg by mouth at bedtime. , Disp: , Rfl:  .  aspirin EC 81 MG tablet, Take 81 mg by mouth daily., Disp: , Rfl:  .  CALCIUM CARBONATE-VITAMIN D PO, Take 1 tablet by mouth 4 (four) times a week. , Disp: , Rfl:  .  cholecalciferol (VITAMIN D) 1000 units tablet, Take 1,000 Units by mouth 4 (four) times a week. , Disp: , Rfl:  .  Cobalamin Combinations (FOLTRATE) 500-1 MCG-MG TABS, Take by mouth., Disp: , Rfl:  .  cyanocobalamin 1000 MCG tablet, Take 1,000 mcg by mouth 4 (four) times a week. , Disp: , Rfl:  .   Probiotic CAPS, Take 1 capsule by mouth daily., Disp: , Rfl:  .  vitamin C (ASCORBIC ACID) 500 MG tablet, Take 500 mg by mouth daily., Disp: , Rfl:  .  vitamin E 400 UNIT capsule, Take 400 Units by mouth 4 (four) times a week. , Disp: , Rfl:  .  lisinopril (ZESTRIL) 40 MG tablet, Take 1 tablet by mouth once daily (Patient not taking: Reported on 10/31/2019), Disp: 90 tablet, Rfl: 0 .  pantoprazole (PROTONIX) 40 MG tablet, Take 1 tablet (40 mg total) by mouth daily. (Patient not taking: Reported on 10/31/2019), Disp: 30 tablet, Rfl: 3 .  saccharomyces boulardii (FLORASTOR) 250 MG capsule, Take by mouth., Disp: , Rfl:   Review of Systems  Constitutional: Negative for appetite change, chills, fatigue and fever.  Eyes: Negative.   Respiratory: Negative for chest tightness and shortness of breath.   Cardiovascular: Negative for chest pain and palpitations.  Gastrointestinal: Negative for abdominal pain, nausea and vomiting.  Skin: Negative.   Neurological: Negative for dizziness and weakness.  Psychiatric/Behavioral: Negative.     Social History   Tobacco Use  . Smoking status: Current Every Day Smoker    Packs/day: 0.50    Years: 40.00    Pack years: 20.00    Types: Cigarettes  . Smokeless tobacco: Never Used  . Tobacco comment: less than  Substance Use Topics  . Alcohol use: No    Alcohol/week: 0.0 standard drinks  Objective:   BP (!) 161/75 (BP Location: Left Arm, Patient Position: Sitting, Cuff Size: Small)   Pulse (!) 50   Temp (!) 96.6 F (35.9 C) (Other (Comment))   Resp 18   Ht 4\' 11"  (1.499 m)   Wt 94 lb 9.6 oz (42.9 kg)   SpO2 98%   BMI 19.11 kg/m  Vitals:   11/22/19 1101  BP: (!) 161/75  Pulse: (!) 50  Resp: 18  Temp: (!) 96.6 F (35.9 C)  TempSrc: Other (Comment)  SpO2: 98%  Weight: 94 lb 9.6 oz (42.9 kg)  Height: 4\' 11"  (1.499 m)  Body mass index is 19.11 kg/m.   Physical Exam Vitals reviewed.  Constitutional:      General: She is not in acute  distress.    Appearance: Normal appearance. She is not ill-appearing, toxic-appearing or diaphoretic.  HENT:     Head: Normocephalic and atraumatic.     Mouth/Throat:     Mouth: Mucous membranes are moist.     Pharynx: No oropharyngeal exudate.  Cardiovascular:     Rate and Rhythm: Normal rate and regular rhythm.  Pulmonary:     Effort: Pulmonary effort is normal. No respiratory distress.     Breath sounds: Normal breath sounds. No stridor. No wheezing, rhonchi or rales.  Chest:     Chest wall: No tenderness.  Abdominal:     Palpations: Abdomen is soft.  Musculoskeletal:     Cervical back: Normal range of motion.  Skin:    General: Skin is warm.     Capillary Refill: Capillary refill takes less than 2 seconds.  Neurological:     Mental Status: She is alert and oriented to person, place, and time. Mental status is at baseline.  Psychiatric:        Mood and Affect: Mood normal.        Behavior: Behavior normal.      No results found for any visits on 11/22/19.     Assessment & Plan    1. Late onset Alzheimer's disease with behavioral disturbance ([place]) New onset behavioral disturbance.May need memory care unit. - QUEtiapine (SEROQUEL) 25 MG tablet; Take 1 tablet (25 mg total) by mouth at bedtime.  Dispense: 30 tablet; Refill: 5  2. Chronic kidney disease due to hypertension   3. Weight loss Stable. More than 50% 25 minute visit spent in counseling or coordination of care   4. Smokes tobacco daily Pt now has quit.     Richard Cranford Mon, MD  [facility]

## 2019-11-21 ENCOUNTER — Ambulatory Visit: Payer: Medicare Other

## 2019-11-22 ENCOUNTER — Ambulatory Visit: Payer: Self-pay | Admitting: Family Medicine

## 2019-11-22 ENCOUNTER — Ambulatory Visit (INDEPENDENT_AMBULATORY_CARE_PROVIDER_SITE_OTHER): Payer: Medicare Other | Admitting: Family Medicine

## 2019-11-22 ENCOUNTER — Other Ambulatory Visit: Payer: Self-pay

## 2019-11-22 ENCOUNTER — Encounter: Payer: Self-pay | Admitting: Family Medicine

## 2019-11-22 VITALS — BP 160/70 | HR 50 | Temp 96.6°F | Resp 18 | Ht 59.0 in | Wt 94.6 lb

## 2019-11-22 DIAGNOSIS — G301 Alzheimer's disease with late onset: Secondary | ICD-10-CM

## 2019-11-22 DIAGNOSIS — R634 Abnormal weight loss: Secondary | ICD-10-CM | POA: Diagnosis not present

## 2019-11-22 DIAGNOSIS — I129 Hypertensive chronic kidney disease with stage 1 through stage 4 chronic kidney disease, or unspecified chronic kidney disease: Secondary | ICD-10-CM

## 2019-11-22 DIAGNOSIS — F02818 Dementia in other diseases classified elsewhere, unspecified severity, with other behavioral disturbance: Secondary | ICD-10-CM

## 2019-11-22 DIAGNOSIS — F0281 Dementia in other diseases classified elsewhere with behavioral disturbance: Secondary | ICD-10-CM

## 2019-11-22 DIAGNOSIS — F17200 Nicotine dependence, unspecified, uncomplicated: Secondary | ICD-10-CM

## 2019-11-22 MED ORDER — QUETIAPINE FUMARATE 25 MG PO TABS: 25.0000 mg | ORAL_TABLET | Freq: Every day | ORAL | 5 refills | Status: DC

## 2019-11-23 ENCOUNTER — Ambulatory Visit: Payer: Medicare Other

## 2019-11-23 ENCOUNTER — Other Ambulatory Visit: Payer: Self-pay

## 2019-11-23 DIAGNOSIS — Z9181 History of falling: Secondary | ICD-10-CM

## 2019-11-23 DIAGNOSIS — R2681 Unsteadiness on feet: Secondary | ICD-10-CM | POA: Diagnosis not present

## 2019-11-23 NOTE — Therapy (Signed)
[place] MAIN [facility] SERVICES [address] [place], [place], [address] Phone: [phone]   Fax:  [phone]  Physical Therapy Treatment  Patient Details  Name: Sarah [name] MRN: [id number] Date of Birth: Dec 18, 1940 Referring Provider (PT): Dr. Rosanna Randy   Encounter Date: 11/23/2019  PT End of Session - 11/23/19 1335    Visit Number  3    Number of Visits  25    Date for PT Re-Evaluation  01/23/20    Authorization Type  eval: 10/31/19    PT Start Time  1110    PT Stop Time  1150    PT Time Calculation (min)  40 min    Equipment Utilized During Treatment  Gait belt    Activity Tolerance  Patient tolerated treatment well    Behavior During Therapy  [facility] for tasks assessed/performed   Confused      Past Medical History:  Diagnosis Date  . Arthritis   . Dementia ([place])    ALZHEIMERS  . Ear anomaly    INNER EAR ISSUES  . Hyperlipidemia   . Hypertension     Past Surgical History:  Procedure Laterality Date  . APPENDECTOMY    . CATARACT EXTRACTION W/PHACO Right 03/09/2018   Procedure: CATARACT EXTRACTION PHACO AND INTRAOCULAR LENS PLACEMENT (IOC);  Surgeon: Birder Robson, MD;  Location: [facility] ORS;  Service: Ophthalmology;  Laterality: Right;  [place] 00:38.9 AP% 15.9 CDE 6.19 FLUID PACK LOT # [id number]  . CATARACT EXTRACTION W/PHACO Left 06/22/2018   Procedure: CATARACT EXTRACTION PHACO AND INTRAOCULAR LENS PLACEMENT (IOC);  Surgeon: Birder Robson, MD;  Location: [facility] ORS;  Service: Ophthalmology;  Laterality: Left;  [place] 00:50 AP% 16.5 CDE 8.25 Fluid pack lot # [id number]  . CESAREAN SECTION    . CHOLECYSTECTOMY    . CYSTOSCOPY WITH STENT PLACEMENT      There were no vitals filed for this visit.  Subjective Assessment - 11/23/19 1334    Subjective  Pt is doing well today. No pain reported and no specific questions or concerns from patient or grandson [name]. [name] reports that the patient went to see her PCP yesterday and she has  lost some weight. They were encouraged to try to help her eat as many calories as possible. She has not been complaining of any pain during her exercises at home.    Pertinent History  Sarah [name] is a 79 y.o. female with a history of severe Alzheimer's dementia, hypertension who was brought to the ED due to a fall at home. She hit the back of her head resulting in bleeding.  She was treated with staples to close her wound and had a follow-up appointment with her PCP who ordered PT due to fall. Per grandson patient's Alzheimer's has progressed significantly over the last few months. He states that she likes to walk but is unstable. She has had 2 falls in the last 6 months. Per grandson at baseline she is AOx1 and is able to recognize her son and grandson with whom she lives. She does not participate in any regular exercise.    Limitations  Walking    Patient Stated Goals  Improve balance and safety    Currently in Pain?  No/denies         TREATMENT   Ther-ex  NuStep L0 x 5 minutes BUE/BLE, extensive redirection and cues to continue but significantly improved from previous session; Seated marches with 3# ankle weights 2 x 15; Seated clams with light manual  resistance 2 x 15; Seated adductor squeeze with light manual resistance 2 x 15; Seated LAQ 3# ankle weights 2 x 15 bilateral; Sit to stand with BUE handheld support 2 x 10; Standing hip flexion marching with 3# ankle weights and bilateral handheld support x 15; Standing hip abduction with 3# ankle weights and bilateral UE support on mat table x 15; Standing mini squats with BUE handheld support x 10;   Neuromuscular Re-education  Agility ladder stepping focusing on large amplitude steps x 6 lengths, attempted lateral stepping however pt unable to understand instruction; 6" step alternating toe taps without UE support x 10 on each side, pt requiring heavy tactile assist to prevent her from holding onto railing; Therapist danced with  patient to tango music in order to work on balance. Performed side stepping in both directions, forward/backward stepping, and 180/360 degree turns to both directions. Pt visibly fatigued at the end of the session.   Pt educated throughout session about proper posture and technique with exercises. Improved exercise technique, movement at target joints, use of target muscles after min to mod verbal, visual, tactile cues.    Pt requires repeated redirection during session due to advanced dementia however she requires less redirection today compared to last session. However she requires intermittent mod/max tactile and verbal cues as well as demonstration from therapist to complete exercises. Incorporated more standing strengthening and balance exercises today during session. Therapist danced to tango music again with patient to work on dynamic balance which she continues to appear to enjoy. Pt will benefit from PT services to address deficits in strength, balance, and mobility in order to return to full function at home.                           PT Short Term Goals - 11/01/19 0813      PT SHORT TERM GOAL #1   Title  Pt will be participate with HEP at the direction of her caregiver in order to improve strength and balance in order to decrease fall risk and improve function at home.    Time  6    Period  Weeks    Status  New    Target Date  12/12/19        PT Long Term Goals - 11/01/19 0814      PT LONG TERM GOAL #1   Title  Pt will improve BERG by at least 3 points in order to demonstrate clinically significant improvement in balance.    Baseline  10/31/19: 30/56    Time  12    Period  Weeks    Status  New    Target Date  01/23/20      PT LONG TERM GOAL #2   Title  Pt will decrease TUG to below 14 seconds/decrease in order to demonstrate decreased fall risk.    Baseline  10/31/19: 44.3s    Time  12    Period  Weeks    Status  New    Target Date  01/23/20       PT LONG TERM GOAL #3   Title  Pt will increase 10MWT by at least 0.13 m/s in order to demonstrate clinically significant improvement in community ambulation.    Baseline  10/31/19: Self-selected: 57.4s =  0.17 m/s;    Time  12    Period  Weeks    Status  New    Target Date  01/23/20  PT LONG TERM GOAL #4   Title  Pt will be able to perform sit to stand from a regular height chair without UE support in order to demonstrate improved leg strength and safety    Baseline  10/31/19: Heavy UE dependence with sit to stand transfers    Time  12    Period  Weeks    Status  New    Target Date  01/23/20            Plan - 11/23/19 1335    Clinical Impression Statement  Pt requires repeated redirection during session due to advanced dementia however she requires less redirection today compared to last session. However she requires intermittent mod/max tactile and verbal cues as well as demonstration from therapist to complete exercises. Incorporated more standing strengthening and balance exercises today during session. Therapist danced to tango music again with patient to work on dynamic balance which she continues to appear to enjoy. Pt will benefit from PT services to address deficits in strength, balance, and mobility in order to return to full function at home.    Personal Factors and Comorbidities  Age;Comorbidity 3+    Comorbidities  Alzheimer's, anxiety/depression, arthritis    Examination-Activity Limitations  Bathing;Bed Mobility;Caring for Others;Carry;Dressing;Hygiene/Grooming;Lift;Locomotion Level;Squat;Stand;Transfers    Examination-Participation Restrictions  Community Activity;Driving;Interpersonal Relationship;Laundry;Medication Management;Meal Prep;Personal Finances;Shop    Stability/Clinical Decision Making  Unstable/Unpredictable    Rehab Potential  Fair    PT Frequency  2x / week    PT Duration  12 weeks    PT Treatment/Interventions  ADLs/Self Care Home Management;Aquatic  Therapy;Biofeedback;Canalith Repostioning;Cryotherapy;Electrical Stimulation;Iontophoresis 4mg /ml Dexamethasone;Moist Heat;Traction;Ultrasound;DME Instruction;Gait training;Stair training;Functional mobility training;Therapeutic activities;Therapeutic exercise;Balance training;Neuromuscular re-education;Cognitive remediation;Patient/family education;Manual techniques;Passive range of motion;Dry needling;Vestibular;Joint Manipulations    PT Next Visit Plan  Balance and strengthening    PT Home Exercise Plan  seated marches, seated clams, seated LAQ, feet together balance, slow marching, sit to stand with minimal UE dependendence    Consulted and Agree with Plan of Care  Family member/caregiver    Family Member Consulted  [name]       Patient will benefit from skilled therapeutic intervention in order to improve the following deficits and impairments:  Abnormal gait, Decreased balance, Decreased cognition, Decreased knowledge of precautions, Decreased safety awareness, Decreased strength, Difficulty walking  Visit Diagnosis: Unsteadiness on feet  History of falling     Problem List Patient Active Problem List   Diagnosis Date Noted  . Chronic kidney disease due to hypertension 07/24/2019  . Mesenteric artery stenosis ([place]) 03/24/2019  . Renal artery stenosis ([place]) 01/26/2017  . Personal history of tobacco use, presenting hazards to health 06/23/2016  . Depressive disorder 05/29/2015  . Dementia ([place]) 05/29/2015  . Allergic rhinitis 03/05/2015  . Weak pulse 03/05/2015  . Smokes tobacco daily 03/05/2015  . Generalized anxiety disorder 03/05/2015  . Acid reflux 03/05/2015  . HLD (hyperlipidemia) 03/05/2015  . Affective disorder, major 03/05/2015  . Bad memory 03/05/2015  . Arthritis, degenerative 03/05/2015  . OP (osteoporosis) 03/05/2015  . Renovascular hypertension 03/05/2015  . Scoliosis 03/05/2015  . Avitaminosis D 03/05/2015  . Weight loss 11/07/2014  . Family history  of colon cancer 11/07/2014   Phillips Grout PT, DPT, GCS  Huprich,Jason 11/23/2019, 1:41 PM  [place] MAIN [facility] SERVICES [address] [place], [place], [address] Phone: [phone]   Fax:  [phone]  Name: Sarah [name] MRN: [id number] Date of Birth: 04/04/1941

## 2019-11-28 ENCOUNTER — Ambulatory Visit: Payer: Medicare Other

## 2019-11-30 ENCOUNTER — Other Ambulatory Visit: Payer: Self-pay

## 2019-11-30 ENCOUNTER — Ambulatory Visit: Payer: Medicare Other | Attending: Family Medicine

## 2019-11-30 DIAGNOSIS — Z9181 History of falling: Secondary | ICD-10-CM | POA: Insufficient documentation

## 2019-11-30 DIAGNOSIS — R2681 Unsteadiness on feet: Secondary | ICD-10-CM | POA: Diagnosis not present

## 2019-11-30 NOTE — Therapy (Signed)
[place] MAIN [facility] SERVICES [address] [place], [place], [address] Phone: [phone]   Fax:  [phone]  Physical Therapy Treatment  Patient Details  Name: Sarah [name] MRN: [id number] Date of Birth: May 21, 1941 Referring Provider (PT): Dr. Rosanna Randy   Encounter Date: 11/30/2019  PT End of Session - 11/30/19 1115    Visit Number  4    Number of Visits  25    Date for PT Re-Evaluation  01/23/20    Authorization Type  eval: 10/31/19    PT Start Time  1110    PT Stop Time  1150    PT Time Calculation (min)  40 min    Equipment Utilized During Treatment  Gait belt    Activity Tolerance  Patient tolerated treatment well    Behavior During Therapy  [facility] for tasks assessed/performed   Confused      Past Medical History:  Diagnosis Date  . Arthritis   . Dementia ([place])    ALZHEIMERS  . Ear anomaly    INNER EAR ISSUES  . Hyperlipidemia   . Hypertension     Past Surgical History:  Procedure Laterality Date  . APPENDECTOMY    . CATARACT EXTRACTION W/PHACO Right 03/09/2018   Procedure: CATARACT EXTRACTION PHACO AND INTRAOCULAR LENS PLACEMENT (IOC);  Surgeon: Birder Robson, MD;  Location: [facility] ORS;  Service: Ophthalmology;  Laterality: Right;  [place] 00:38.9 AP% 15.9 CDE 6.19 FLUID PACK LOT # [id number]  . CATARACT EXTRACTION W/PHACO Left 06/22/2018   Procedure: CATARACT EXTRACTION PHACO AND INTRAOCULAR LENS PLACEMENT (IOC);  Surgeon: Birder Robson, MD;  Location: [facility] ORS;  Service: Ophthalmology;  Laterality: Left;  [place] 00:50 AP% 16.5 CDE 8.25 Fluid pack lot # [id number]  . CESAREAN SECTION    . CHOLECYSTECTOMY    . CYSTOSCOPY WITH STENT PLACEMENT      There were no vitals filed for this visit.  Subjective Assessment - 11/30/19 1113    Subjective  Pt is doing well today. No pain reported and no specific questions or concerns from patient or grandson [name]. She still has some L knee pain intermittently which is chronic but no  complaints today.    Pertinent History  Sarah [name] is a 79 y.o. female with a history of severe Alzheimer's dementia, hypertension who was brought to the ED due to a fall at home. She hit the back of her head resulting in bleeding.  She was treated with staples to close her wound and had a follow-up appointment with her PCP who ordered PT due to fall. Per grandson patient's Alzheimer's has progressed significantly over the last few months. He states that she likes to walk but is unstable. She has had 2 falls in the last 6 months. Per grandson at baseline she is AOx1 and is able to recognize her son and grandson with whom she lives. She does not participate in any regular exercise.    Limitations  Walking    Patient Stated Goals  Improve balance and safety    Currently in Pain?  No/denies          TREATMENT   Ther-ex NuStep L0 x 5 minutes BUE/BLE, extensive redirection and cues to continue but significantly improved from previous session; Standing marches with 3# ankle weights and verbal cues from therapist to increase height of march x 10; Seated LAQ 3# ankle weights x 10 bilateral; Sit to stand with BUE handheld support x 10; Standing hip abduction with 3# ankle weights and  bilateral UE support in // bars x 10 bilateral, extensive cues to perform correctly; Standing mini squats with BUE handheld support x 10;   Neuromuscular Re-education Forward/backward/R lateral/L lateral stepping in // bars with bilateral handheld support from therapist x multiple laps each; Airex WBOS balance without UE support, attempted multiple times however after approximately 10s pt reaches for parallel bars or tries to step off Airex pad. Therapist has to provide extensive verbal and tactile assist to attempt to prevent pt from holding bars but eventually had to discontinue exercise; Therapist danced with patient to tango music in order to work on balance. Performed side stepping in both directions,  forward/backward stepping, and 180/360 degree turns to both directions.   Pt educated throughout session about proper posture and technique with exercises. Improved exercise technique, movement at target joints, use of target muscles after min to mod verbal, visual, tactile cues.   Pt requires repeated redirection during session due to advanced dementia however again she requires less redirection today compared to last session. She demonstrates improved recall of performance on the NuStep. She still requires intermittent mod/max tactile and verbal cues as well as demonstration from therapist to complete exercises. Continued standing strengthening and balance exercises today during session. Therapist danced to tango music again with patient to work on dynamic balance which she continues to appear to enjoy.She particularly enjoys listening to familiar music. Pt will benefit from PT services to address deficits in strength, balance, and mobility in order to return to full function at home.                       PT Short Term Goals - 11/01/19 0813      PT SHORT TERM GOAL #1   Title  Pt will be participate with HEP at the direction of her caregiver in order to improve strength and balance in order to decrease fall risk and improve function at home.    Time  6    Period  Weeks    Status  New    Target Date  12/12/19        PT Long Term Goals - 11/01/19 0814      PT LONG TERM GOAL #1   Title  Pt will improve BERG by at least 3 points in order to demonstrate clinically significant improvement in balance.    Baseline  10/31/19: 30/56    Time  12    Period  Weeks    Status  New    Target Date  01/23/20      PT LONG TERM GOAL #2   Title  Pt will decrease TUG to below 14 seconds/decrease in order to demonstrate decreased fall risk.    Baseline  10/31/19: 44.3s    Time  12    Period  Weeks    Status  New    Target Date  01/23/20      PT LONG TERM GOAL #3   Title  Pt  will increase 10MWT by at least 0.13 m/s in order to demonstrate clinically significant improvement in community ambulation.    Baseline  10/31/19: Self-selected: 57.4s =  0.17 m/s;    Time  12    Period  Weeks    Status  New    Target Date  01/23/20      PT LONG TERM GOAL #4   Title  Pt will be able to perform sit to stand from a regular height chair without UE support  in order to demonstrate improved leg strength and safety    Baseline  10/31/19: Heavy UE dependence with sit to stand transfers    Time  12    Period  Weeks    Status  New    Target Date  01/23/20            Plan - 11/30/19 1115    Clinical Impression Statement  Pt requires repeated redirection during session due to advanced dementia however again she requires less redirection today compared to last session. She demonstrates improved recall of performance on the NuStep. She still requires intermittent mod/max tactile and verbal cues as well as demonstration from therapist to complete exercises. Continued standing strengthening and balance exercises today during session. Therapist danced to tango music again with patient to work on dynamic balance which she continues to appear to enjoy. She particularly enjoys listening to familiar music. Pt will benefit from PT services to address deficits in strength, balance, and mobility in order to return to full function at home.    Personal Factors and Comorbidities  Age;Comorbidity 3+    Comorbidities  Alzheimer's, anxiety/depression, arthritis    Examination-Activity Limitations  Bathing;Bed Mobility;Caring for Others;Carry;Dressing;Hygiene/Grooming;Lift;Locomotion Level;Squat;Stand;Transfers    Examination-Participation Restrictions  Community Activity;Driving;Interpersonal Relationship;Laundry;Medication Management;Meal Prep;Personal Finances;Shop    Stability/Clinical Decision Making  Unstable/Unpredictable    Rehab Potential  Fair    PT Frequency  2x / week    PT Duration  12  weeks    PT Treatment/Interventions  ADLs/Self Care Home Management;Aquatic Therapy;Biofeedback;Canalith Repostioning;Cryotherapy;Electrical Stimulation;Iontophoresis 4mg /ml Dexamethasone;Moist Heat;Traction;Ultrasound;DME Instruction;Gait training;Stair training;Functional mobility training;Therapeutic activities;Therapeutic exercise;Balance training;Neuromuscular re-education;Cognitive remediation;Patient/family education;Manual techniques;Passive range of motion;Dry needling;Vestibular;Joint Manipulations    PT Next Visit Plan  Balance and strengthening    PT Home Exercise Plan  seated marches, seated clams, seated LAQ, feet together balance, slow marching, sit to stand with minimal UE dependendence    Consulted and Agree with Plan of Care  Family member/caregiver    Family Member Consulted  [name]       Patient will benefit from skilled therapeutic intervention in order to improve the following deficits and impairments:  Abnormal gait, Decreased balance, Decreased cognition, Decreased knowledge of precautions, Decreased safety awareness, Decreased strength, Difficulty walking  Visit Diagnosis: Unsteadiness on feet  History of falling     Problem List Patient Active Problem List   Diagnosis Date Noted  . Chronic kidney disease due to hypertension 07/24/2019  . Mesenteric artery stenosis ([place]) 03/24/2019  . Renal artery stenosis ([place]) 01/26/2017  . Personal history of tobacco use, presenting hazards to health 06/23/2016  . Depressive disorder 05/29/2015  . Dementia ([place]) 05/29/2015  . Allergic rhinitis 03/05/2015  . Weak pulse 03/05/2015  . Smokes tobacco daily 03/05/2015  . Generalized anxiety disorder 03/05/2015  . Acid reflux 03/05/2015  . HLD (hyperlipidemia) 03/05/2015  . Affective disorder, major 03/05/2015  . Bad memory 03/05/2015  . Arthritis, degenerative 03/05/2015  . OP (osteoporosis) 03/05/2015  . Renovascular hypertension 03/05/2015  . Scoliosis  03/05/2015  . Avitaminosis D 03/05/2015  . Weight loss 11/07/2014  . Family history of colon cancer 11/07/2014   Phillips Grout PT, DPT, GCS  Marquee Fuchs 11/30/2019, 12:55 PM  [place] MAIN [facility] SERVICES [address] [place], [place], [address] Phone: [phone]   Fax:  [phone]  Name: Sarah [name] MRN: [id number] Date of Birth: 25-Oct-1941

## 2019-12-05 ENCOUNTER — Ambulatory Visit: Payer: Medicare Other

## 2019-12-07 ENCOUNTER — Ambulatory Visit: Payer: Medicare Other

## 2019-12-07 ENCOUNTER — Other Ambulatory Visit: Payer: Self-pay

## 2019-12-07 DIAGNOSIS — R2681 Unsteadiness on feet: Secondary | ICD-10-CM

## 2019-12-07 DIAGNOSIS — Z9181 History of falling: Secondary | ICD-10-CM

## 2019-12-07 NOTE — Therapy (Signed)
[place] MAIN [facility] SERVICES [address] [place], [place], [address] Phone: [phone]   Fax:  [phone]  Physical Therapy Treatment  Patient Details  Name: Sarah [name] MRN: [id number] Date of Birth: November 26, 1940 Referring Provider (PT): Sarah [name]   Encounter Date: 12/07/2019  PT End of Session - 12/07/19 1112    Visit Number  5    Number of Visits  25    Date for PT Re-Evaluation  01/23/20    Authorization Type  eval: 10/31/19    PT Start Time  1105    PT Stop Time  1150    PT Time Calculation (min)  45 min    Equipment Utilized During Treatment  Gait belt    Activity Tolerance  Patient tolerated treatment well    Behavior During Therapy  [facility] for tasks assessed/performed   Confused      Past Medical History:  Diagnosis Date  . Arthritis   . Dementia ([place])    ALZHEIMERS  . Ear anomaly    INNER EAR ISSUES  . Hyperlipidemia   . Hypertension     Past Surgical History:  Procedure Laterality Date  . APPENDECTOMY    . CATARACT EXTRACTION W/PHACO Right 03/09/2018   Procedure: CATARACT EXTRACTION PHACO AND INTRAOCULAR LENS PLACEMENT (IOC);  Surgeon: Birder Robson, MD;  Location: [facility] ORS;  Service: Ophthalmology;  Laterality: Right;  [place] 00:38.9 AP% 15.9 CDE 6.19 FLUID PACK LOT # [id number] H  . CATARACT EXTRACTION W/PHACO Left 06/22/2018   Procedure: CATARACT EXTRACTION PHACO AND INTRAOCULAR LENS PLACEMENT (IOC);  Surgeon: Birder Robson, MD;  Location: [facility] ORS;  Service: Ophthalmology;  Laterality: Left;  [place] 00:50 AP% 16.5 CDE 8.25 Fluid pack lot # [id number] H  . CESAREAN SECTION    . CHOLECYSTECTOMY    . CYSTOSCOPY WITH STENT PLACEMENT      There were no vitals filed for this visit.  Subjective Assessment - 12/07/19 1110    Subjective  Pt is doing well today. No pain reported and no specific questions or concerns from patient or grandson Sarah [name]. She still has some L knee pain intermittently and slightly worse over the  last couple days. The pain is chronic but no complaints today. Pt has been eating well lately. No falls reported since last visit and no specific questions or concerns currently.    Pertinent History  Sarah [name] is a 79 y.o. female with a history of severe Alzheimer's dementia, hypertension who was brought to the ED due to a fall at home. She hit the back of her head resulting in bleeding.  She was treated with staples to close her wound and had a follow-up appointment with her PCP who ordered PT due to fall. Per grandson patient's Alzheimer's has progressed significantly over the last few months. He states that she likes to walk but is unstable. She has had 2 falls in the last 6 months. Per grandson at baseline she is AOx1 and is able to recognize her son and grandson with whom she lives. She does not participate in any regular exercise.    Limitations  Walking    Patient Stated Goals  Improve balance and safety          TREATMENT   Ther-ex NuStep L0/1 x 5 minutes BUE/BLE, extensive redirection and cues to continuebutsignificantlyimproved from previous session; Standing marcheswith 3# ankle weights and verbal cues from therapist to increase height of march x 15; Seated LAQ3# ankle weights x 15bilateral; Seated clams with  manual resistance from therapist x 15; Seated adductor squeeze with manual resistance; Sit to stand with BUE handheld supportx 10, added Airex pad under feet x 10; Standing hip abduction with 3# ankle weights and bilateral UE support in // bars x 10 bilateral, extensive cues to perform correctly; Standing 6" step taps with 3# ankle weights x 10 bilateral;   Neuromuscular Re-education Balloon taps standing on firm ground with grandson guarding, therapist varying direction and height of balloon to patient; Balloon taps standing on Airex pad with grandson guarding, therapist varying direction and height of balloon to patient; Ball kicking alternating between  green pball and small yellow inflatable ball to vary challenge with grandson guarding; Therapist danced with patientto tango musicin order to work on balance. Performedside stepping in both directions,forward/backward stepping, and 180/360 degree turns to both directions.   Pt educated throughout session about proper posture and technique with exercises. Improved exercise technique, movement at target joints, use of target muscles after min to mod verbal, visual, tactile cues.   Pt requires repeated redirection during session due to advanced dementiahowever with each session seems to grow in familiarity and require less cues. Introduced some new balance exercises today with balloon taps on firm and Airex pad as well as kicking. She is able to complete seated and standing exercises with therapist today. Therapist danced to tango music again with patient to work on dynamic balance which she continues to appear to enjoy.She particularly enjoys listening to familiar music. Pt will benefit from PT services to address deficits in strength, balance, and mobility in order to return to full function at home.                       PT Short Term Goals - 11/01/19 0813      PT SHORT TERM GOAL #1   Title  Pt will be participate with HEP at the direction of her caregiver in order to improve strength and balance in order to decrease fall risk and improve function at home.    Time  6    Period  Weeks    Status  New    Target Date  12/12/19        PT Long Term Goals - 11/01/19 0814      PT LONG TERM GOAL #1   Title  Pt will improve BERG by at least 3 points in order to demonstrate clinically significant improvement in balance.    Baseline  10/31/19: 30/56    Time  12    Period  Weeks    Status  New    Target Date  01/23/20      PT LONG TERM GOAL #2   Title  Pt will decrease TUG to below 14 seconds/decrease in order to demonstrate decreased fall risk.    Baseline   10/31/19: 44.3s    Time  12    Period  Weeks    Status  New    Target Date  01/23/20      PT LONG TERM GOAL #3   Title  Pt will increase 10MWT by at least 0.13 m/s in order to demonstrate clinically significant improvement in community ambulation.    Baseline  10/31/19: Self-selected: 57.4s =  0.17 m/s;    Time  12    Period  Weeks    Status  New    Target Date  01/23/20      PT LONG TERM GOAL #4   Title  Pt  will be able to perform sit to stand from a regular height chair without UE support in order to demonstrate improved leg strength and safety    Baseline  10/31/19: Heavy UE dependence with sit to stand transfers    Time  12    Period  Weeks    Status  New    Target Date  01/23/20            Plan - 12/07/19 1112    Clinical Impression Statement  Pt requires repeated redirection during session due to advanced dementia however with each session seems to grow in familiarity and require less cues. Introduced some new balance exercises today with balloon taps on firm and Airex pad as well as kicking. She is able to complete seated and standing exercises with therapist today. Therapist danced to tango music again with patient to work on dynamic balance which she continues to appear to enjoy. She particularly enjoys listening to familiar music. Pt will benefit from PT services to address deficits in strength, balance, and mobility in order to return to full function at home.    Personal Factors and Comorbidities  Age;Comorbidity 3+    Comorbidities  Alzheimer's, anxiety/depression, arthritis    Examination-Activity Limitations  Bathing;Bed Mobility;Caring for Others;Carry;Dressing;Hygiene/Grooming;Lift;Locomotion Level;Squat;Stand;Transfers    Examination-Participation Restrictions  Community Activity;Driving;Interpersonal Relationship;Laundry;Medication Management;Meal Prep;Personal Finances;Shop    Stability/Clinical Decision Making  Unstable/Unpredictable    Rehab Potential  Fair    PT  Frequency  2x / week    PT Duration  12 weeks    PT Treatment/Interventions  ADLs/Self Care Home Management;Aquatic Therapy;Biofeedback;Canalith Repostioning;Cryotherapy;Electrical Stimulation;Iontophoresis 4mg /ml Dexamethasone;Moist Heat;Traction;Ultrasound;DME Instruction;Gait training;Stair training;Functional mobility training;Therapeutic activities;Therapeutic exercise;Balance training;Neuromuscular re-education;Cognitive remediation;Patient/family education;Manual techniques;Passive range of motion;Dry needling;Vestibular;Joint Manipulations    PT Next Visit Plan  Balance and strengthening    PT Home Exercise Plan  seated marches, seated clams, seated LAQ, feet together balance, slow marching, sit to stand with minimal UE dependendence    Consulted and Agree with Plan of Care  Family member/caregiver    Family Member Consulted  [name]       Patient will benefit from skilled therapeutic intervention in order to improve the following deficits and impairments:  Abnormal gait, Decreased balance, Decreased cognition, Decreased knowledge of precautions, Decreased safety awareness, Decreased strength, Difficulty walking  Visit Diagnosis: Unsteadiness on feet  History of falling     Problem List Patient Active Problem List   Diagnosis Date Noted  . Chronic kidney disease due to hypertension 07/24/2019  . Mesenteric artery stenosis ([place]) 03/24/2019  . Renal artery stenosis ([place]) 01/26/2017  . Personal history of tobacco use, presenting hazards to health 06/23/2016  . Depressive disorder 05/29/2015  . Dementia ([place]) 05/29/2015  . Allergic rhinitis 03/05/2015  . Weak pulse 03/05/2015  . Smokes tobacco daily 03/05/2015  . Generalized anxiety disorder 03/05/2015  . Acid reflux 03/05/2015  . HLD (hyperlipidemia) 03/05/2015  . Affective disorder, major 03/05/2015  . Bad memory 03/05/2015  . Arthritis, degenerative 03/05/2015  . OP (osteoporosis) 03/05/2015  . Renovascular  hypertension 03/05/2015  . Scoliosis 03/05/2015  . Avitaminosis D 03/05/2015  . Weight loss 11/07/2014  . Family history of colon cancer 11/07/2014   Phillips Grout PT, DPT, GCS  Brietta Manso 12/07/2019, 1:23 PM  [place] MAIN [facility] SERVICES [address] [place], [place], [address] Phone: [phone]   Fax:  [phone]  Name: Sarah [name] MRN: [id number] Date of Birth: 04/13/1941

## 2019-12-12 ENCOUNTER — Ambulatory Visit: Payer: Medicare Other

## 2019-12-14 ENCOUNTER — Ambulatory Visit: Payer: Medicare Other

## 2019-12-19 ENCOUNTER — Ambulatory Visit: Payer: Medicare Other

## 2019-12-21 ENCOUNTER — Ambulatory Visit: Payer: Medicare Other

## 2019-12-23 ENCOUNTER — Other Ambulatory Visit: Payer: Self-pay

## 2019-12-23 ENCOUNTER — Ambulatory Visit: Payer: Medicare Other

## 2019-12-23 DIAGNOSIS — Z9181 History of falling: Secondary | ICD-10-CM

## 2019-12-23 DIAGNOSIS — R2681 Unsteadiness on feet: Secondary | ICD-10-CM

## 2019-12-23 NOTE — Therapy (Signed)
[place] MAIN [facility] SERVICES [address] [place], [place], [address] Phone: [phone]   Fax:  [phone]  Physical Therapy Treatment  Patient Details  Name: Sarah [name] MRN: [id number] Date of Birth: 08/30/1941 Referring Provider (PT): Dr. Rosanna Randy   Encounter Date: 12/23/2019  PT End of Session - 12/23/19 1248    Visit Number  6    Number of Visits  25    Date for PT Re-Evaluation  01/23/20    Authorization Type  eval: 10/31/19    PT Start Time  0900    PT Stop Time  0930    PT Time Calculation (min)  30 min    Equipment Utilized During Treatment  Gait belt    Activity Tolerance  Patient tolerated treatment well    Behavior During Therapy  [facility] for tasks assessed/performed   Confused      Past Medical History:  Diagnosis Date  . Arthritis   . Dementia ([place])    ALZHEIMERS  . Ear anomaly    INNER EAR ISSUES  . Hyperlipidemia   . Hypertension     Past Surgical History:  Procedure Laterality Date  . APPENDECTOMY    . CATARACT EXTRACTION W/PHACO Right 03/09/2018   Procedure: CATARACT EXTRACTION PHACO AND INTRAOCULAR LENS PLACEMENT (IOC);  Surgeon: Birder Robson, MD;  Location: [facility] ORS;  Service: Ophthalmology;  Laterality: Right;  [place] 00:38.9 AP% 15.9 CDE 6.19 FLUID PACK LOT # [id number]  . CATARACT EXTRACTION W/PHACO Left 06/22/2018   Procedure: CATARACT EXTRACTION PHACO AND INTRAOCULAR LENS PLACEMENT (IOC);  Surgeon: Birder Robson, MD;  Location: [facility] ORS;  Service: Ophthalmology;  Laterality: Left;  [place] 00:50 AP% 16.5 CDE 8.25 Fluid pack lot # [id number] H  . CESAREAN SECTION    . CHOLECYSTECTOMY    . CYSTOSCOPY WITH STENT PLACEMENT      There were no vitals filed for this visit.  Subjective Assessment - 12/23/19 1237    Subjective  Pt is doing well today. No pain reported upon arrival. Her grandson [name] reports that she has had two falls since her last therapy session. No signficant injuries but did hit her ear  during one fall and land on her tailbone during the other fall. She still has some L knee pain intermittently. The pain is chronic but no complaints today    Pertinent History  Sarah [name] is a 79 y.o. female with a history of severe Alzheimer's dementia, hypertension who was brought to the ED due to a fall at home. She hit the back of her head resulting in bleeding.  She was treated with staples to close her wound and had a follow-up appointment with her PCP who ordered PT due to fall. Per grandson patient's Alzheimer's has progressed significantly over the last few months. He states that she likes to walk but is unstable. She has had 2 falls in the last 6 months. Per grandson at baseline she is AOx1 and is able to recognize her son and grandson with whom she lives. She does not participate in any regular exercise.    Limitations  Walking    Patient Stated Goals  Improve balance and safety    Currently in Pain?  No/denies         TREATMENT   Ther-ex NuStep L0 x 5 minutes BUE/BLE, extensive redirection and cues to continue; Standingmarcheswith 3# ankle weightsand verbal cues from therapist to increase height of marchx 15; Seated clams with manual resistance from therapist x 15;  Seated adductor squeeze with manual resistance; Sit to stand with BUE handheld supportx 10; Standing hip abduction with 3# ankle weights and bilateral UE supportin // bars x 3-5 bilateral, extensive cues to perform correctly and pt still struggles to complete correctly so exercise has to be discontinued; Standing 6" step ups with 3# ankle weights x 10;   Neuromuscular Re-education Therapist danced with patientto tango musicin order to work on balance. Performedside stepping in both directions,forward/backward stepping, and 180/360 degree turns to both directions. Also performed stepping over 1/2 foam rolls forward and side stepping. Halfway through incorporated red mat for walking forward/backwards as  well as turns.    Pt educated throughout session about proper posture and technique with exercises. Improved exercise technique, movement at target joints, use of target muscles after min to mod verbal, visual, tactile cues.   Pt requires repeated redirection during session due to advanced dementia. Given that it has been a couple weeks since her last session she requires some increased direction today. She arrived late so session was somewhat abbreviated today. Performed seated and standing exercises as pt is able and once again utilized dancing for balance training.Pt will benefit from PT services to address deficits in strength, balance, and mobility in order to return to full function at home.                        PT Short Term Goals - 11/01/19 0813      PT SHORT TERM GOAL #1   Title  Pt will be participate with HEP at the direction of her caregiver in order to improve strength and balance in order to decrease fall risk and improve function at home.    Time  6    Period  Weeks    Status  New    Target Date  12/12/19        PT Long Term Goals - 11/01/19 0814      PT LONG TERM GOAL #1   Title  Pt will improve BERG by at least 3 points in order to demonstrate clinically significant improvement in balance.    Baseline  10/31/19: 30/56    Time  12    Period  Weeks    Status  New    Target Date  01/23/20      PT LONG TERM GOAL #2   Title  Pt will decrease TUG to below 14 seconds/decrease in order to demonstrate decreased fall risk.    Baseline  10/31/19: 44.3s    Time  12    Period  Weeks    Status  New    Target Date  01/23/20      PT LONG TERM GOAL #3   Title  Pt will increase 10MWT by at least 0.13 m/s in order to demonstrate clinically significant improvement in community ambulation.    Baseline  10/31/19: Self-selected: 57.4s =  0.17 m/s;    Time  12    Period  Weeks    Status  New    Target Date  01/23/20      PT LONG TERM GOAL #4   Title   Pt will be able to perform sit to stand from a regular height chair without UE support in order to demonstrate improved leg strength and safety    Baseline  10/31/19: Heavy UE dependence with sit to stand transfers    Time  12    Period  Weeks    Status  New    Target Date  01/23/20            Plan - 12/23/19 1249    Clinical Impression Statement  Pt requires repeated redirection during session due to advanced dementia. Given that it has been a couple weeks since her last session she requires some increased direction today. She arrived late so session was somewhat abbreviated today. Performed seated and standing exercises as pt is able and once again utilized dancing for balance training. Pt will benefit from PT services to address deficits in strength, balance, and mobility in order to return to full function at home.    Personal Factors and Comorbidities  Age;Comorbidity 3+    Comorbidities  Alzheimer's, anxiety/depression, arthritis    Examination-Activity Limitations  Bathing;Bed Mobility;Caring for Others;Carry;Dressing;Hygiene/Grooming;Lift;Locomotion Level;Squat;Stand;Transfers    Examination-Participation Restrictions  Community Activity;Driving;Interpersonal Relationship;Laundry;Medication Management;Meal Prep;Personal Finances;Shop    Stability/Clinical Decision Making  Unstable/Unpredictable    Rehab Potential  Fair    PT Frequency  2x / week    PT Duration  12 weeks    PT Treatment/Interventions  ADLs/Self Care Home Management;Aquatic Therapy;Biofeedback;Canalith Repostioning;Cryotherapy;Electrical Stimulation;Iontophoresis 4mg /ml Dexamethasone;Moist Heat;Traction;Ultrasound;DME Instruction;Gait training;Stair training;Functional mobility training;Therapeutic activities;Therapeutic exercise;Balance training;Neuromuscular re-education;Cognitive remediation;Patient/family education;Manual techniques;Passive range of motion;Dry needling;Vestibular;Joint Manipulations    PT Next Visit  Plan  Balance and strengthening    PT Home Exercise Plan  seated marches, seated clams, seated LAQ, feet together balance, slow marching, sit to stand with minimal UE dependendence    Consulted and Agree with Plan of Care  Family member/caregiver    Family Member Consulted  Sarah [name]       Patient will benefit from skilled therapeutic intervention in order to improve the following deficits and impairments:  Abnormal gait, Decreased balance, Decreased cognition, Decreased knowledge of precautions, Decreased safety awareness, Decreased strength, Difficulty walking  Visit Diagnosis: Unsteadiness on feet  History of falling     Problem List Patient Active Problem List   Diagnosis Date Noted  . Chronic kidney disease due to hypertension 07/24/2019  . Mesenteric artery stenosis ([place]) 03/24/2019  . Renal artery stenosis ([place]) 01/26/2017  . Personal history of tobacco use, presenting hazards to health 06/23/2016  . Depressive disorder 05/29/2015  . Dementia ([place]) 05/29/2015  . Allergic rhinitis 03/05/2015  . Weak pulse 03/05/2015  . Smokes tobacco daily 03/05/2015  . Generalized anxiety disorder 03/05/2015  . Acid reflux 03/05/2015  . HLD (hyperlipidemia) 03/05/2015  . Affective disorder, major 03/05/2015  . Bad memory 03/05/2015  . Arthritis, degenerative 03/05/2015  . OP (osteoporosis) 03/05/2015  . Renovascular hypertension 03/05/2015  . Scoliosis 03/05/2015  . Avitaminosis D 03/05/2015  . Weight loss 11/07/2014  . Family history of colon cancer 11/07/2014   Sarah [name] PT, DPT, GCS  Sarah [name] 12/23/2019, 12:57 PM  [place] MAIN [facility] SERVICES [address] [place], [place], [address] Phone: [phone]   Fax:  [phone]  Name: Sarah [name] MRN: [id number] Date of Birth: 17-Nov-1940

## 2019-12-28 ENCOUNTER — Other Ambulatory Visit: Payer: Self-pay

## 2019-12-28 ENCOUNTER — Ambulatory Visit: Payer: Medicare Other | Attending: Family Medicine

## 2019-12-28 DIAGNOSIS — R2681 Unsteadiness on feet: Secondary | ICD-10-CM | POA: Diagnosis not present

## 2019-12-28 DIAGNOSIS — Z9181 History of falling: Secondary | ICD-10-CM | POA: Insufficient documentation

## 2019-12-28 NOTE — Therapy (Signed)
[place] MAIN [facility] SERVICES [address] [place], [place], [address] Phone: [phone]   Fax:  [phone]  Physical Therapy Treatment  Patient Details  Name: Sarah [name] MRN: [id number] Date of Birth: 1941-09-30 Referring Provider (PT): Dr. Rosanna Randy   Encounter Date: 12/28/2019  PT End of Session - 12/28/19 1108    Visit Number  7    Number of Visits  25    Date for PT Re-Evaluation  01/23/20    Authorization Type  eval: 10/31/19    PT Start Time  1212    PT Stop Time  1255    PT Time Calculation (min)  43 min    Equipment Utilized During Treatment  Gait belt    Activity Tolerance  Patient tolerated treatment well    Behavior During Therapy  [facility] for tasks assessed/performed   Confused      Past Medical History:  Diagnosis Date  . Arthritis   . Dementia ([place])    ALZHEIMERS  . Ear anomaly    INNER EAR ISSUES  . Hyperlipidemia   . Hypertension     Past Surgical History:  Procedure Laterality Date  . APPENDECTOMY    . CATARACT EXTRACTION W/PHACO Right 03/09/2018   Procedure: CATARACT EXTRACTION PHACO AND INTRAOCULAR LENS PLACEMENT (IOC);  Surgeon: Birder Robson, MD;  Location: [facility] ORS;  Service: Ophthalmology;  Laterality: Right;  [place] 00:38.9 AP% 15.9 CDE 6.19 FLUID PACK LOT # [id number]  . CATARACT EXTRACTION W/PHACO Left 06/22/2018   Procedure: CATARACT EXTRACTION PHACO AND INTRAOCULAR LENS PLACEMENT (IOC);  Surgeon: Birder Robson, MD;  Location: [facility] ORS;  Service: Ophthalmology;  Laterality: Left;  [place] 00:50 AP% 16.5 CDE 8.25 Fluid pack lot # [id number]  . CESAREAN SECTION    . CHOLECYSTECTOMY    . CYSTOSCOPY WITH STENT PLACEMENT      There were no vitals filed for this visit.  Subjective Assessment - 12/28/19 1107    Subjective  Pt is doing well today. No resting pain reported upon arrival. No falls since last therapy session. No recent changes in health or medication. She still has some L knee pain  intermittently. The pain is chronic but no complaints today. Sarah [name] reports that the Seroquel doesn't appear to be helping patient with sleep.    Pertinent History  Sarah [name] is a 79 y.o. female with a history of severe Alzheimer's dementia, hypertension who was brought to the ED due to a fall at home. She hit the back of her head resulting in bleeding.  She was treated with staples to close her wound and had a follow-up appointment with her PCP who ordered PT due to fall. Per grandson patient's Alzheimer's has progressed significantly over the last few months. He states that she likes to walk but is unstable. She has had 2 falls in the last 6 months. Per grandson at baseline she is AOx1 and is able to recognize her son and grandson with whom she lives. She does not participate in any regular exercise.    Limitations  Walking    Patient Stated Goals  Improve balance and safety    Currently in Pain?  No/denies         TREATMENT   Ther-ex NuStep L0/1x 5 minutes BUE/BLE, extensive redirection and cues to continue; Standingmarcheswith 3# ankle weights(AW) and verbal cues from therapist to increase height of marchx 15; Sit to stand with BUE handheld supportx 10; Seated marches with 4# AW x 15 each;  Seated LAQ with 4# ankle weights x 15 each, pt requires assist to get to terminal knee extension; Attempted standing hip abduction with patient hwoever she is unable to follow commands; Standing mini squats x 15; Standing 6" step ups with 3# ankle weights x 10;   Neuromuscular Re-education Forward/backward ambulation in // bars with 4# ankle weights x 3 lengths each; Forward/backward ambulation on red mat with BUE hand held support from therapist, multiple lengths of mat performed and pt also performed 360 degree and 180 degree turns in both directions; Standing balance on red mat WBOS balloon taps with grandson while therapist guards, increased anxiety noted in patient during  this activity due to instability; Therapist danced with patientto tango musicin order to work on balance. Performedside stepping in both directions,forward/backward stepping, 180/360 degree turns, and cross-over steps to both directions;   Pt educated throughout session about proper posture and technique with exercises. Improved exercise technique, movement at target joints, use of target muscles after min to mod verbal, visual, tactile cues.   Pt requires repeated redirection during session due to advanced dementia. She continues to struggle with some strengthening so creative activities incorporated to strengthen. Utilized red mat today on the floor for an unstable surface during ambulation and dynamic reaching. Once again utilized dancing for balance training.Pt will benefit from PT services to address deficits in strength, balance, and mobility in order to return to full function at home.                       PT Short Term Goals - 11/01/19 0813      PT SHORT TERM GOAL #1   Title  Pt will be participate with HEP at the direction of her caregiver in order to improve strength and balance in order to decrease fall risk and improve function at home.    Time  6    Period  Weeks    Status  New    Target Date  12/12/19        PT Long Term Goals - 11/01/19 0814      PT LONG TERM GOAL #1   Title  Pt will improve BERG by at least 3 points in order to demonstrate clinically significant improvement in balance.    Baseline  10/31/19: 30/56    Time  12    Period  Weeks    Status  New    Target Date  01/23/20      PT LONG TERM GOAL #2   Title  Pt will decrease TUG to below 14 seconds/decrease in order to demonstrate decreased fall risk.    Baseline  10/31/19: 44.3s    Time  12    Period  Weeks    Status  New    Target Date  01/23/20      PT LONG TERM GOAL #3   Title  Pt will increase 10MWT by at least 0.13 m/s in order to demonstrate clinically significant  improvement in community ambulation.    Baseline  10/31/19: Self-selected: 57.4s =  0.17 m/s;    Time  12    Period  Weeks    Status  New    Target Date  01/23/20      PT LONG TERM GOAL #4   Title  Pt will be able to perform sit to stand from a regular height chair without UE support in order to demonstrate improved leg strength and safety    Baseline  10/31/19:  Heavy UE dependence with sit to stand transfers    Time  12    Period  Weeks    Status  New    Target Date  01/23/20            Plan - 12/28/19 1108    Clinical Impression Statement  Pt requires repeated redirection during session due to advanced dementia. She continues to struggle with some strengthening so creative activities incorporated to strengthen. Utilized red mat today on the floor for an unstable surface during ambulation and dynamic reaching. Once again utilized dancing for balance training. Pt will benefit from PT services to address deficits in strength, balance, and mobility in order to return to full function at home.    Personal Factors and Comorbidities  Age;Comorbidity 3+    Comorbidities  Alzheimer's, anxiety/depression, arthritis    Examination-Activity Limitations  Bathing;Bed Mobility;Caring for Others;Carry;Dressing;Hygiene/Grooming;Lift;Locomotion Level;Squat;Stand;Transfers    Examination-Participation Restrictions  Community Activity;Driving;Interpersonal Relationship;Laundry;Medication Management;Meal Prep;Personal Finances;Shop    Stability/Clinical Decision Making  Unstable/Unpredictable    Rehab Potential  Fair    PT Frequency  2x / week    PT Duration  12 weeks    PT Treatment/Interventions  ADLs/Self Care Home Management;Aquatic Therapy;Biofeedback;Canalith Repostioning;Cryotherapy;Electrical Stimulation;Iontophoresis 4mg /ml Dexamethasone;Moist Heat;Traction;Ultrasound;DME Instruction;Gait training;Stair training;Functional mobility training;Therapeutic activities;Therapeutic exercise;Balance  training;Neuromuscular re-education;Cognitive remediation;Patient/family education;Manual techniques;Passive range of motion;Dry needling;Vestibular;Joint Manipulations    PT Next Visit Plan  Balance and strengthening    PT Home Exercise Plan  seated marches, seated clams, seated LAQ, feet together balance, slow marching, sit to stand with minimal UE dependendence    Consulted and Agree with Plan of Care  Family member/caregiver    Family Member Consulted  Sarah [name]       Patient will benefit from skilled therapeutic intervention in order to improve the following deficits and impairments:  Abnormal gait, Decreased balance, Decreased cognition, Decreased knowledge of precautions, Decreased safety awareness, Decreased strength, Difficulty walking  Visit Diagnosis: Unsteadiness on feet  History of falling     Problem List Patient Active Problem List   Diagnosis Date Noted  . Chronic kidney disease due to hypertension 07/24/2019  . Mesenteric artery stenosis ([place]) 03/24/2019  . Renal artery stenosis ([place]) 01/26/2017  . Personal history of tobacco use, presenting hazards to health 06/23/2016  . Depressive disorder 05/29/2015  . Dementia ([place]) 05/29/2015  . Allergic rhinitis 03/05/2015  . Weak pulse 03/05/2015  . Smokes tobacco daily 03/05/2015  . Generalized anxiety disorder 03/05/2015  . Acid reflux 03/05/2015  . HLD (hyperlipidemia) 03/05/2015  . Affective disorder, major 03/05/2015  . Bad memory 03/05/2015  . Arthritis, degenerative 03/05/2015  . OP (osteoporosis) 03/05/2015  . Renovascular hypertension 03/05/2015  . Scoliosis 03/05/2015  . Avitaminosis D 03/05/2015  . Weight loss 11/07/2014  . Family history of colon cancer 11/07/2014   Phillips Grout PT, DPT, GCS  Sarah [name] 12/28/2019, 2:14 PM  [place] MAIN [facility] SERVICES [address] [place], [place], [address] Phone: [phone]   Fax:  [phone]  Name: Sarah [name] MRN: [id number] Date of Birth: 1941-02-08

## 2020-01-02 ENCOUNTER — Other Ambulatory Visit: Payer: Self-pay

## 2020-01-02 ENCOUNTER — Ambulatory Visit: Payer: Self-pay

## 2020-01-02 ENCOUNTER — Emergency Department
Admission: EM | Admit: 2020-01-02 | Discharge: 2020-01-02 | Disposition: A | Discharge status: Home / Self Care | Payer: Medicare Other | Attending: Emergency Medicine | Admitting: Emergency Medicine

## 2020-01-02 ENCOUNTER — Encounter: Payer: Self-pay | Admitting: *Deleted

## 2020-01-02 ENCOUNTER — Emergency Department: Payer: Medicare Other

## 2020-01-02 DIAGNOSIS — S8992XA Unspecified injury of left lower leg, initial encounter: Secondary | ICD-10-CM | POA: Diagnosis not present

## 2020-01-02 DIAGNOSIS — Y998 Other external cause status: Secondary | ICD-10-CM | POA: Insufficient documentation

## 2020-01-02 DIAGNOSIS — Y92013 Bedroom of single-family (private) house as the place of occurrence of the external cause: Secondary | ICD-10-CM | POA: Insufficient documentation

## 2020-01-02 DIAGNOSIS — N189 Chronic kidney disease, unspecified: Secondary | ICD-10-CM | POA: Insufficient documentation

## 2020-01-02 DIAGNOSIS — I129 Hypertensive chronic kidney disease with stage 1 through stage 4 chronic kidney disease, or unspecified chronic kidney disease: Secondary | ICD-10-CM | POA: Diagnosis not present

## 2020-01-02 DIAGNOSIS — S79911A Unspecified injury of right hip, initial encounter: Secondary | ICD-10-CM | POA: Diagnosis not present

## 2020-01-02 DIAGNOSIS — M25552 Pain in left hip: Secondary | ICD-10-CM | POA: Diagnosis not present

## 2020-01-02 DIAGNOSIS — Z87891 Personal history of nicotine dependence: Secondary | ICD-10-CM | POA: Diagnosis not present

## 2020-01-02 DIAGNOSIS — M25551 Pain in right hip: Secondary | ICD-10-CM | POA: Diagnosis not present

## 2020-01-02 DIAGNOSIS — W06XXXA Fall from bed, initial encounter: Secondary | ICD-10-CM | POA: Insufficient documentation

## 2020-01-02 DIAGNOSIS — Z79899 Other long term (current) drug therapy: Secondary | ICD-10-CM | POA: Insufficient documentation

## 2020-01-02 DIAGNOSIS — Y9389 Activity, other specified: Secondary | ICD-10-CM | POA: Insufficient documentation

## 2020-01-02 DIAGNOSIS — G309 Alzheimer's disease, unspecified: Secondary | ICD-10-CM | POA: Insufficient documentation

## 2020-01-02 DIAGNOSIS — N1832 Chronic kidney disease, stage 3b: Secondary | ICD-10-CM | POA: Diagnosis not present

## 2020-01-02 DIAGNOSIS — I10 Essential (primary) hypertension: Secondary | ICD-10-CM | POA: Diagnosis not present

## 2020-01-02 DIAGNOSIS — M25562 Pain in left knee: Secondary | ICD-10-CM | POA: Insufficient documentation

## 2020-01-02 DIAGNOSIS — W19XXXA Unspecified fall, initial encounter: Secondary | ICD-10-CM

## 2020-01-02 DIAGNOSIS — I701 Atherosclerosis of renal artery: Secondary | ICD-10-CM | POA: Diagnosis not present

## 2020-01-02 NOTE — Telephone Encounter (Signed)
Patients grandson called stating that his grandmother fell early Sunday morning. He says she was on the floor for a little while before he found her.  She fell in her rt hip. He states that yesterday she could not walk on the leg. Today she can walk some. He states that her knees are swollen and she does have a spot on her head that she complained about. When he touches her hip she c/o pain. He states that she walks normally bent over and with her toes turned inward. Per protocol Grandson Connor Foxworthy will take her to ER for evaluation of injuries. Care advice given. He verbalized understanding.  Reason for Disposition . Sounds like a serious injury to the triager  Answer Assessment - Initial Assessment Questions 1. MECHANISM: "How did the injury happen?" (e.g., twisting injury, direct blow)      Direct blow from fall 2. ONSET: "When did the injury happen?" (Minutes or hours ago)     Sunday morning 3. LOCATION: "Where is the injury located?"      Rt hip 4. APPEARANCE of INJURY: "What does the injury look like?"  (e.g., deformity of leg)    Knees swollen 5. SEVERITY: "Can you put weight on that leg?" "Can you walk?"      Yes able to walk some 6. SIZE: For cuts, bruises, or swelling, ask: "How large is it?" (e.g., inches or centimeters;  entire joint)      Rt hip  7. PAIN: "Is there pain?" If so, ask: "How bad is the pain?"  (e.g., Scale 1-10; or mild, moderate, severe)   7-8 8. TETANUS: For any breaks in the skin, ask: "When was the last tetanus booster?"     No 9. OTHER SYMPTOMS: "Do you have any other symptoms?"      Knees swelling, may have hit head,  10. PREGNANCY: "Is there any chance you are pregnant?" "When was your last menstrual period?"      N/A  Protocols used: HIP INJURY-A-AH

## 2020-01-02 NOTE — ED Triage Notes (Addendum)
Pt brought in by her grandson from home via wheelchair.  Pt fell getting out of the bed during the night. Pain in left knee with swelling.  Pt also right hip pain too.  Pt has alz disease.  Pt alert.  No loc.  No neck or back pain.  No headache.  No vomiting.

## 2020-01-02 NOTE — ED Notes (Signed)
Pt reports pain to right hip that radiates to lower back

## 2020-01-02 NOTE — ED Provider Notes (Signed)
[facility] Emergency Department Provider Note ____________________________________________  Time seen: Approximately 6:27 PM  I have reviewed the triage vital signs and the nursing notes.   HISTORY  Chief Complaint Fall    HPI Sarah [name] is a 79 y.o. female who presents to the emergency department for evaluation and treatment of right hip and left knee pain.   According to the grandson, she got out of bed last night to go to the bathroom and fell.  She denies striking her head and he states that when they got to her that she was awake and alert.  Since the fall, she has had pain in the right hip and left knee but has been able to move around fairly well.  She does not like to use her cane and that because of her Alzheimer's disease introducing her to the idea of using a walker has not been successful.  She has had multiple falls over the past several months.  She is currently going to physical therapy weekly.  [name] was advised to bring her to the emergency department for x-rays just to ensure that she did not have any fractures due to her fall.  Past Medical History:  Diagnosis Date  . Arthritis   . Dementia ([place])    ALZHEIMERS  . Ear anomaly    INNER EAR ISSUES  . Hyperlipidemia   . Hypertension     Patient Active Problem List   Diagnosis Date Noted  . Chronic kidney disease due to hypertension 07/24/2019  . Mesenteric artery stenosis ([place]) 03/24/2019  . Renal artery stenosis ([place]) 01/26/2017  . Personal history of tobacco use, presenting hazards to health 06/23/2016  . Depressive disorder 05/29/2015  . Dementia ([place]) 05/29/2015  . Allergic rhinitis 03/05/2015  . Weak pulse 03/05/2015  . Smokes tobacco daily 03/05/2015  . Generalized anxiety disorder 03/05/2015  . Acid reflux 03/05/2015  . HLD (hyperlipidemia) 03/05/2015  . Affective disorder, major 03/05/2015  . Bad memory 03/05/2015  . Arthritis, degenerative 03/05/2015  . OP  (osteoporosis) 03/05/2015  . Renovascular hypertension 03/05/2015  . Scoliosis 03/05/2015  . Avitaminosis D 03/05/2015  . Weight loss 11/07/2014  . Family history of colon cancer 11/07/2014    Past Surgical History:  Procedure Laterality Date  . APPENDECTOMY    . CATARACT EXTRACTION W/PHACO Right 03/09/2018   Procedure: CATARACT EXTRACTION PHACO AND INTRAOCULAR LENS PLACEMENT (IOC);  Surgeon: Birder Robson, MD;  Location: [facility] ORS;  Service: Ophthalmology;  Laterality: Right;  [place] 00:38.9 AP% 15.9 CDE 6.19 FLUID PACK LOT # [id number]  . CATARACT EXTRACTION W/PHACO Left 06/22/2018   Procedure: CATARACT EXTRACTION PHACO AND INTRAOCULAR LENS PLACEMENT (IOC);  Surgeon: Birder Robson, MD;  Location: [facility] ORS;  Service: Ophthalmology;  Laterality: Left;  [place] 00:50 AP% 16.5 CDE 8.25 Fluid pack lot # [id number]  . CESAREAN SECTION    . CHOLECYSTECTOMY    . CYSTOSCOPY WITH STENT PLACEMENT      Prior to Admission medications   Medication Sig Start Date End Date Taking? Authorizing Provider  acetaminophen (TYLENOL) 500 MG tablet Take 500 mg by mouth at bedtime.     [provider]  aspirin EC 81 MG tablet Take 81 mg by mouth daily.    [provider]  CALCIUM CARBONATE-VITAMIN D PO Take 1 tablet by mouth 4 (four) times a week.     [provider]  cholecalciferol (VITAMIN D) 1000 units tablet Take 1,000 Units by mouth 4 (four) times a week.  [provider]  Cobalamin Combinations (FOLTRATE) 500-1 MCG-MG TABS Take by mouth.    [provider]  cyanocobalamin 1000 MCG tablet Take 1,000 mcg by mouth 4 (four) times a week.     [provider]  lisinopril (ZESTRIL) 40 MG tablet Take 1 tablet by mouth once daily Patient not taking: Reported on 10/31/2019 04/23/19   Jerrol Banana., MD  pantoprazole (PROTONIX) 40 MG tablet Take 1 tablet (40 mg total) by mouth daily. Patient not taking: Reported on 10/31/2019 06/14/18   Jerrol Banana., MD  Probiotic CAPS Take 1 capsule by mouth daily. 07/07/19   [provider]  QUEtiapine (SEROQUEL) 25 MG tablet Take 1 tablet (25 mg total) by mouth at bedtime. 11/22/19   Jerrol Banana., MD  saccharomyces boulardii (FLORASTOR) 250 MG capsule Take by mouth.    [provider]  vitamin C (ASCORBIC ACID) 500 MG tablet Take 500 mg by mouth daily.    [provider]  vitamin E 400 UNIT capsule Take 400 Units by mouth 4 (four) times a week.     [provider]    Allergies Penicillins  Family History  Problem Relation Age of Onset  . Colon cancer Father   . Pancreatic cancer Sister   . GER disease Son   . Alzheimer's disease Sister     Social History Social History   Tobacco Use  . Smoking status: Former Smoker    Packs/day: 0.50    Years: 40.00    Pack years: 20.00    Types: Cigarettes  . Smokeless tobacco: Never Used  . Tobacco comment: less than  Substance Use Topics  . Alcohol use: No    Alcohol/week: 0.0 standard drinks  . Drug use: No    Review of Systems Constitutional: Negative for fever. Cardiovascular: Negative for chest pain. Respiratory: Negative for shortness of breath. Musculoskeletal: Positive for right hip and left knee pain. Skin: Negative for open wounds or lesions. Neurological: Negative for decrease in sensation  ____________________________________________   PHYSICAL EXAM:  VITAL SIGNS: ED Triage Vitals  Enc Vitals Group     BP 01/02/20 1517 (!) 155/67     Pulse Rate 01/02/20 1517 100     Resp 01/02/20 1517 18     Temp 01/02/20 1517 98.8 F (37.1 C)     Temp Source 01/02/20 1517 Oral     SpO2 01/02/20 1517 99 %     Weight 01/02/20 1514 97 lb (44 kg)     Height 01/02/20 1514 4\' 9"  (1.448 m)     Head Circumference --      Peak Flow --      Pain Score 01/02/20 1514 8     Pain Loc --      Pain Edu? --      Excl. in [place]? --     Constitutional: Alert and oriented. Well appearing and in no  acute distress. Eyes: Conjunctivae are clear without discharge or drainage Head: Atraumatic.  No scalp hematoma discovered on exam.  Forehead atraumatic Neck: Supple.  No focal midline tenderness. Respiratory: No cough. Respirations are even and unlabored. Musculoskeletal: Able to demonstrate flexion at the hips bilaterally.  No shortening or rotation noted.  Left and right knee without focal tenderness, obvious deformity or laxity. Neurologic: Motor and sensory is intact. Skin: No open wounds or lesions.  No contusions or hematomas noted. Psychiatric: Affect and behavior are appropriate.  ____________________________________________   LABS (all labs ordered  are listed, but only abnormal results are displayed)  Labs Reviewed - No data to display ____________________________________________  RADIOLOGY  Images of the left knee and right hip are negative for acute findings  I, Correen Bubolz, personally viewed and evaluated these images (plain radiographs) as part of my medical decision making, as well as reviewing the written report by the radiologist.  DG Knee Complete 4 Views Left  Result Date: 01/02/2020 CLINICAL DATA:  Recent fall with left knee pain, initial encounter EXAM: LEFT KNEE - COMPLETE 4+ VIEW COMPARISON:  None. FINDINGS: Degenerative changes of the left knee joint are noted most prominently in the lateral joint compartment. No significant joint effusion is seen. No acute fracture or dislocation is noted. Vascular calcifications are seen. IMPRESSION: Degenerative change without acute abnormality. Electronically Signed   By: Inez Catalina M.D.   On: 01/02/2020 17:59   DG Hip Unilat  With Pelvis 2-3 Views Right  Result Date: 01/02/2020 CLINICAL DATA:  Pain post fall EXAM: DG HIP (WITH OR WITHOUT PELVIS) 2-3V RIGHT COMPARISON:  None. FINDINGS: SI joints are non widened. Pubic symphysis and rami are intact. No acute displaced fracture or malalignment. Vascular calcifications.  IMPRESSION: No acute osseous abnormality. Electronically Signed   By: Donavan Foil M.D.   On: 01/02/2020 18:00   ____________________________________________   PROCEDURES  Procedures  ____________________________________________   INITIAL IMPRESSION / ASSESSMENT AND PLAN / ED COURSE  Sarah [name] is a 79 y.o. who presents to the emergency department for treatment and evaluation after mechanical, nonsyncopal fall around 3:20 AM.  Exam and x-rays of the left knee and right hip are reassuring.  She will be discharged home and advised to follow-up with primary care.  She was encouraged to use her cane, however she just smiled and nodded her head kind of laughed.  [name] states that they are doing everything they can to prevent her from falling.  Patient instructed to follow-up with primary care for symptoms that are not improving.  She was also instructed to return to the emergency department for symptoms that change or worsen if unable schedule an appointment with orthopedics or primary care.  Medications - No data to display  Pertinent labs & imaging results that were available during my care of the patient were reviewed by me and considered in my medical decision making (see chart for details).   _________________________________________   FINAL CLINICAL IMPRESSION(S) / ED DIAGNOSES  Final diagnoses:  Fall, initial encounter  Hip pain, acute, right  Acute pain of left knee    ED Discharge Orders    None       If controlled substance prescribed during this visit, 12 month history viewed on the [place] prior to issuing an initial prescription for Schedule II or III opiod.   Victorino Dike, FNP 01/02/20 Annia Belt, MD 01/03/20 1504

## 2020-01-02 NOTE — ED Notes (Signed)
Pt fell getting out of bed to use the bathroom at approx 0320 this AM. Pt has a cane at home, but does not use it. Pt lives at home with son and grandson. Pt grandson at bedside.

## 2020-01-02 NOTE — ED Notes (Signed)
Pt reports taking 2 tylenol at approx 1200

## 2020-01-03 ENCOUNTER — Ambulatory Visit: Payer: Medicare Other | Admitting: Family Medicine

## 2020-01-04 ENCOUNTER — Other Ambulatory Visit: Payer: Self-pay

## 2020-01-04 ENCOUNTER — Ambulatory Visit: Payer: Medicare Other

## 2020-01-04 DIAGNOSIS — Z9181 History of falling: Secondary | ICD-10-CM | POA: Diagnosis not present

## 2020-01-04 DIAGNOSIS — R2681 Unsteadiness on feet: Secondary | ICD-10-CM | POA: Diagnosis not present

## 2020-01-04 NOTE — Therapy (Signed)
[place] MAIN [facility] SERVICES [address] [place], [place], [address] Phone: [phone]   Fax:  [phone]  Physical Therapy Treatment  Patient Details  Name: Sarah [name] MRN: [id number] Date of Birth: 07-08-1941 Referring Provider (PT): Dr. Rosanna Randy   Encounter Date: 01/04/2020    Past Medical History:  Diagnosis Date  . Arthritis   . Dementia ([place])    ALZHEIMERS  . Ear anomaly    INNER EAR ISSUES  . Hyperlipidemia   . Hypertension     Past Surgical History:  Procedure Laterality Date  . APPENDECTOMY    . CATARACT EXTRACTION W/PHACO Right 03/09/2018   Procedure: CATARACT EXTRACTION PHACO AND INTRAOCULAR LENS PLACEMENT (IOC);  Surgeon: Birder Robson, MD;  Location: [facility] ORS;  Service: Ophthalmology;  Laterality: Right;  [place] 00:38.9 AP% 15.9 CDE 6.19 FLUID PACK LOT # [id number]  . CATARACT EXTRACTION W/PHACO Left 06/22/2018   Procedure: CATARACT EXTRACTION PHACO AND INTRAOCULAR LENS PLACEMENT (IOC);  Surgeon: Birder Robson, MD;  Location: [facility] ORS;  Service: Ophthalmology;  Laterality: Left;  [place] 00:50 AP% 16.5 CDE 8.25 Fluid pack lot # [id number]  . CESAREAN SECTION    . CHOLECYSTECTOMY    . CYSTOSCOPY WITH STENT PLACEMENT      There were no vitals filed for this visit.  Subjective Assessment - 01/04/20 1208    Subjective  Family reported that patient fell on Sunday, and went to ED on Monday, no fractures noted but grandson did note that the patient was limping significantly sunday/monday, and has had some improvement today. Pt reported R hip pain and bilateral knee pain.    Pertinent History  Sarah [name] is a 79 y.o. female with a history of severe Alzheimer's dementia, hypertension who was brought to the ED due to a fall at home. She hit the back of her head resulting in bleeding.  She was treated with staples to close her wound and had a follow-up appointment with her PCP who ordered PT due to fall. Per grandson  patient's Alzheimer's has progressed significantly over the last few months. He states that she likes to walk but is unstable. She has had 2 falls in the last 6 months. Per grandson at baseline she is AOx1 and is able to recognize her son and grandson with whom she lives. She does not participate in any regular exercise.    Limitations  Walking    Patient Stated Goals  Improve balance and safety    Currently in Pain?  Yes    Pain Score  6     Pain Location  Knee   bilateral knees and R hip   Pain Orientation  Lateral;Anterior    Pain Descriptors / Indicators  Sore;Guarding;Grimacing    Pain Type  Acute pain    Pain Onset  In the past 7 days    Pain Frequency  Constant       Ther-ex  NuStep L0/1 x 5 minutes BUE/BLE, extensive redirection and assistance provided to continue movement of LE.  Verbal/visual/tactile cues provided, PT also demonstrated/performed exercises with pt to facilitate: Seated marches with 4# AW 2x01  each; Seated LAQ with 4# ankle weights 2x10 ea Seated DF 4x10 with 4# AW Seated PF 2x10 with 4# AW Lateral taps in seated position with 4# AW, step by step cues for sequencing provided     Neuromuscular Re-education: CGA and handheld assist provided for all  Pt ambulated around clinic with rollator as trial;tended to keep rollator anterior  to base of support, difficulty understanding correction;shuffling step noted but pt able to correct deviations/avoid obstacles independently ~70% of the time.  Ambulation across Red foam on ground multiple bouts, pt cued to turn, pivot, and walk towards colored cones; pt exhibited instability and lack of confidence on uneven surface. Pt ambulated through agility ladder x2. Cues for technique, step length, and re-redirection to task, pt able to complete successfully 60% of the time, but did exhibit increased step length when performed properly.   Pt educated throughout session about proper posture and technique with exercises. Improved  exercise technique, movement at target joints, use of target muscles after min to mod verbal, visual, tactile cues.      Pt response/clinical impression: Session modified accordingly due to pt reported R hip pain/ R knee pain intermittently during session. Pt needed extensive multimodal cueing, demonstration and step by step instructions to maintain exercise/form technique. Pt exhibited  instability and lack of confidence on uneven surface and would benefit from further intervention.        PT Education - 01/04/20 1210    Education Details  therapeutic exercise/technique, re-direction to task    Person(s) Educated  Patient   grandson   Methods  Explanation;Demonstration    Comprehension  Verbalized understanding;Returned demonstration;Verbal cues required;Tactile cues required;Need further instruction       PT Short Term Goals - 11/01/19 0813      PT SHORT TERM GOAL #1   Title  Pt will be participate with HEP at the direction of her caregiver in order to improve strength and balance in order to decrease fall risk and improve function at home.    Time  6    Period  Weeks    Status  New    Target Date  12/12/19        PT Long Term Goals - 11/01/19 0814      PT LONG TERM GOAL #1   Title  Pt will improve BERG by at least 3 points in order to demonstrate clinically significant improvement in balance.    Baseline  10/31/19: 30/56    Time  12    Period  Weeks    Status  New    Target Date  01/23/20      PT LONG TERM GOAL #2   Title  Pt will decrease TUG to below 14 seconds/decrease in order to demonstrate decreased fall risk.    Baseline  10/31/19: 44.3s    Time  12    Period  Weeks    Status  New    Target Date  01/23/20      PT LONG TERM GOAL #3   Title  Pt will increase 10MWT by at least 0.13 m/s in order to demonstrate clinically significant improvement in community ambulation.    Baseline  10/31/19: Self-selected: 57.4s =  0.17 m/s;    Time  12    Period  Weeks    Status   New    Target Date  01/23/20      PT LONG TERM GOAL #4   Title  Pt will be able to perform sit to stand from a regular height chair without UE support in order to demonstrate improved leg strength and safety    Baseline  10/31/19: Heavy UE dependence with sit to stand transfers    Time  12    Period  Weeks    Status  New    Target Date  01/23/20  Plan - 01/04/20 1210    Clinical Impression Statement  Session modified accordingly due to pt reported R hip pain/ R knee pain intermittently during session. Pt needed extensive multimodal cueing, demonstration and step by step instructions to maintain exercise/form technique. Pt exhibited  instability and lack of confidence on uneven surface and would benefit from further intervention.    Personal Factors and Comorbidities  Age;Comorbidity 3+    Comorbidities  Alzheimer's, anxiety/depression, arthritis    Examination-Activity Limitations  Bathing;Bed Mobility;Caring for Others;Carry;Dressing;Hygiene/Grooming;Lift;Locomotion Level;Squat;Stand;Transfers    Examination-Participation Restrictions  Community Activity;Driving;Interpersonal Relationship;Laundry;Medication Management;Meal Prep;Personal Finances;Shop    Stability/Clinical Decision Making  Unstable/Unpredictable    Rehab Potential  Fair    PT Frequency  2x / week    PT Duration  12 weeks    PT Treatment/Interventions  ADLs/Self Care Home Management;Aquatic Therapy;Biofeedback;Canalith Repostioning;Cryotherapy;Electrical Stimulation;Iontophoresis 4mg /ml Dexamethasone;Moist Heat;Traction;Ultrasound;DME Instruction;Gait training;Stair training;Functional mobility training;Therapeutic activities;Therapeutic exercise;Balance training;Neuromuscular re-education;Cognitive remediation;Patient/family education;Manual techniques;Passive range of motion;Dry needling;Vestibular;Joint Manipulations    PT Next Visit Plan  Balance and strengthening    PT Home Exercise Plan  seated marches,  seated clams, seated LAQ, feet together balance, slow marching, sit to stand with minimal UE dependendence    Consulted and Agree with Plan of Care  Family member/caregiver    Family Member Consulted  [name]       Patient will benefit from skilled therapeutic intervention in order to improve the following deficits and impairments:  Abnormal gait, Decreased balance, Decreased cognition, Decreased knowledge of precautions, Decreased safety awareness, Decreased strength, Difficulty walking  Visit Diagnosis: Unsteadiness on feet  History of falling     Problem List Patient Active Problem List   Diagnosis Date Noted  . Chronic kidney disease due to hypertension 07/24/2019  . Mesenteric artery stenosis ([place]) 03/24/2019  . Renal artery stenosis ([place]) 01/26/2017  . Personal history of tobacco use, presenting hazards to health 06/23/2016  . Depressive disorder 05/29/2015  . Dementia ([place]) 05/29/2015  . Allergic rhinitis 03/05/2015  . Weak pulse 03/05/2015  . Smokes tobacco daily 03/05/2015  . Generalized anxiety disorder 03/05/2015  . Acid reflux 03/05/2015  . HLD (hyperlipidemia) 03/05/2015  . Affective disorder, major 03/05/2015  . Bad memory 03/05/2015  . Arthritis, degenerative 03/05/2015  . OP (osteoporosis) 03/05/2015  . Renovascular hypertension 03/05/2015  . Scoliosis 03/05/2015  . Avitaminosis D 03/05/2015  . Weight loss 11/07/2014  . Family history of colon cancer 11/07/2014    Lieutenant Diego PT, DPT 12:17 PM,01/04/20   [place] MAIN [facility] SERVICES [address] [place], [place], [address] Phone: [phone]   Fax:  [phone]  Name: Sarah [name] MRN: [id number] Date of Birth: April 20, 1941

## 2020-01-11 ENCOUNTER — Ambulatory Visit: Payer: Medicare Other

## 2020-01-11 ENCOUNTER — Other Ambulatory Visit: Payer: Self-pay

## 2020-01-11 DIAGNOSIS — R2681 Unsteadiness on feet: Secondary | ICD-10-CM

## 2020-01-11 DIAGNOSIS — Z9181 History of falling: Secondary | ICD-10-CM

## 2020-01-11 NOTE — Therapy (Signed)
[place] MAIN [facility] SERVICES [address] [place], [place], [address] Phone: [phone]   Fax:  [phone]  Physical Therapy Treatment  Patient Details  Name: Sarah [name] MRN: [id number] Date of Birth: 06/14/1941 Referring Provider (PT): Dr. Rosanna Randy   Encounter Date: 01/11/2020  PT End of Session - 01/11/20 0943    Visit Number  9    Number of Visits  25    Date for PT Re-Evaluation  01/23/20    Authorization Type  eval: 10/31/19    PT Start Time  0940    PT Stop Time  1015    PT Time Calculation (min)  35 min    Equipment Utilized During Treatment  Gait belt    Activity Tolerance  Patient tolerated treatment well    Behavior During Therapy  [facility] for tasks assessed/performed       Past Medical History:  Diagnosis Date  . Arthritis   . Dementia ([place])    ALZHEIMERS  . Ear anomaly    INNER EAR ISSUES  . Hyperlipidemia   . Hypertension     Past Surgical History:  Procedure Laterality Date  . APPENDECTOMY    . CATARACT EXTRACTION W/PHACO Right 03/09/2018   Procedure: CATARACT EXTRACTION PHACO AND INTRAOCULAR LENS PLACEMENT (IOC);  Surgeon: Birder Robson, MD;  Location: [facility] ORS;  Service: Ophthalmology;  Laterality: Right;  [place] 00:38.9 AP% 15.9 CDE 6.19 FLUID PACK LOT # [id number]  . CATARACT EXTRACTION W/PHACO Left 06/22/2018   Procedure: CATARACT EXTRACTION PHACO AND INTRAOCULAR LENS PLACEMENT (IOC);  Surgeon: Birder Robson, MD;  Location: [facility] ORS;  Service: Ophthalmology;  Laterality: Left;  [place] 00:50 AP% 16.5 CDE 8.25 Fluid pack lot # [id number] H  . CESAREAN SECTION    . CHOLECYSTECTOMY    . CYSTOSCOPY WITH STENT PLACEMENT      There were no vitals filed for this visit.  Subjective Assessment - 01/11/20 0940    Subjective  Pt reports that she is doing well today. She continues to complain of chronic L knee pain and it has been slightly more frequent than prior to her fall. Otherwise she is not complaining of  additional pain since the fall.    Pertinent History  Sarah [name] is a 79 y.o. female with a history of severe Alzheimer's dementia, hypertension who was brought to the ED due to a fall at home. She hit the back of her head resulting in bleeding.  She was treated with staples to close her wound and had a follow-up appointment with her PCP who ordered PT due to fall. Per grandson patient's Alzheimer's has progressed significantly over the last few months. He states that she likes to walk but is unstable. She has had 2 falls in the last 6 months. Per grandson at baseline she is AOx1 and is able to recognize her son and grandson with whom she lives. She does not participate in any regular exercise.    Limitations  Walking    Patient Stated Goals  Improve balance and safety    Currently in Pain?  No/denies           Ther-ex NuStep L0x 5 minutes BUE/BLE, extensive redirection and assistance provided to continue movement of LE.  Verbal/visual/tactile cues provided, PT also demonstrated/performed exercises with pt to facilitate: Seated marches with 3# ankle weights (AW) x 20 bilateral; Seated LAQ with 3# AW x 10 bilateral; Standing marches with 3# AW x 10 bilateral; Side stepping in // bars without  UE support 3# AW x 2 lengths each direction; Standing heel raises with BUE support x 10;    Neuromuscular Re-education: CGA and handheld assist provided for all Pt ambulated around clinic with rollator with therapist providing cues to avoid obstacles and to take larger steps. Also cues for more toe clearance during stepping. Pt with difficulty understanding correction;shuffling step noted; Rollator ambulation around cones in "S" pattern down and back (4 cones), pt struggles to avoid cones and requires repeated redirection/cues; Stepping over half foam roll during forward ambulation in // bars with 3# AW x 4 lengths; Airex balance balloon taps with grandson x 5 minutes; Therapist danced with  patientto tango musicin order to work on balance. Performedside stepping in both directions,forward/backward stepping, 180/360 degree turns, and cross-over steps to both directions;   Pt educated throughout session about proper posture and technique with exercises. Improved exercise technique, movement at target joints, use of target muscles after min to mod verbal, visual, tactile cues.   Pt requires repeated redirection during session due to advanced dementia.She continues to demonstrate good motivation during sessions but struggles with some strengthening so creative activities incorporated. Grandson reports that he has noticed some improvement at home with her balance. Will update outcome measures and goals at next session as well as complete a progress note. Once again utilized dancing for balance training today.Pt will benefit from PT services to address deficits in strength, balance, and mobility in order to return to full function at home.                     PT Short Term Goals - 11/01/19 0813      PT SHORT TERM GOAL #1   Title  Pt will be participate with HEP at the direction of her caregiver in order to improve strength and balance in order to decrease fall risk and improve function at home.    Time  6    Period  Weeks    Status  New    Target Date  12/12/19        PT Long Term Goals - 11/01/19 0814      PT LONG TERM GOAL #1   Title  Pt will improve BERG by at least 3 points in order to demonstrate clinically significant improvement in balance.    Baseline  10/31/19: 30/56    Time  12    Period  Weeks    Status  New    Target Date  01/23/20      PT LONG TERM GOAL #2   Title  Pt will decrease TUG to below 14 seconds/decrease in order to demonstrate decreased fall risk.    Baseline  10/31/19: 44.3s    Time  12    Period  Weeks    Status  New    Target Date  01/23/20      PT LONG TERM GOAL #3   Title  Pt will increase 10MWT by at least 0.13  m/s in order to demonstrate clinically significant improvement in community ambulation.    Baseline  10/31/19: Self-selected: 57.4s =  0.17 m/s;    Time  12    Period  Weeks    Status  New    Target Date  01/23/20      PT LONG TERM GOAL #4   Title  Pt will be able to perform sit to stand from a regular height chair without UE support in order to demonstrate improved leg strength and  safety    Baseline  10/31/19: Heavy UE dependence with sit to stand transfers    Time  12    Period  Weeks    Status  New    Target Date  01/23/20            Plan - 01/11/20 0944    Clinical Impression Statement  Pt requires repeated redirection during session due to advanced dementia. She continues to demonstrate good motivation during sessions but struggles with some strengthening so creative activities incorporated. Grandson reports that he has noticed some improvement at home with her balance. Will update outcome measures and goals at next session as well as complete a progress note. Once again utilized dancing for balance training today. Pt will benefit from PT services to address deficits in strength, balance, and mobility in order to return to full function at home.    Personal Factors and Comorbidities  Age;Comorbidity 3+    Comorbidities  Alzheimer's, anxiety/depression, arthritis    Examination-Activity Limitations  Bathing;Bed Mobility;Caring for Others;Carry;Dressing;Hygiene/Grooming;Lift;Locomotion Level;Squat;Stand;Transfers    Examination-Participation Restrictions  Community Activity;Driving;Interpersonal Relationship;Laundry;Medication Management;Meal Prep;Personal Finances;Shop    Stability/Clinical Decision Making  Unstable/Unpredictable    Rehab Potential  Fair    PT Frequency  2x / week    PT Duration  12 weeks    PT Treatment/Interventions  ADLs/Self Care Home Management;Aquatic Therapy;Biofeedback;Canalith Repostioning;Cryotherapy;Electrical Stimulation;Iontophoresis 4mg /ml  Dexamethasone;Moist Heat;Traction;Ultrasound;DME Instruction;Gait training;Stair training;Functional mobility training;Therapeutic activities;Therapeutic exercise;Balance training;Neuromuscular re-education;Cognitive remediation;Patient/family education;Manual techniques;Passive range of motion;Dry needling;Vestibular;Joint Manipulations    PT Next Visit Plan  Update outcome meaures, goals, progress note; Balance and strengthening    PT Home Exercise Plan  seated marches, seated clams, seated LAQ, feet together balance, slow marching, sit to stand with minimal UE dependendence    Consulted and Agree with Plan of Care  Family member/caregiver    Family Member Consulted  [name]       Patient will benefit from skilled therapeutic intervention in order to improve the following deficits and impairments:  Abnormal gait, Decreased balance, Decreased cognition, Decreased knowledge of precautions, Decreased safety awareness, Decreased strength, Difficulty walking  Visit Diagnosis: Unsteadiness on feet  History of falling     Problem List Patient Active Problem List   Diagnosis Date Noted  . Chronic kidney disease due to hypertension 07/24/2019  . Mesenteric artery stenosis ([place]) 03/24/2019  . Renal artery stenosis ([place]) 01/26/2017  . Personal history of tobacco use, presenting hazards to health 06/23/2016  . Depressive disorder 05/29/2015  . Dementia ([place]) 05/29/2015  . Allergic rhinitis 03/05/2015  . Weak pulse 03/05/2015  . Smokes tobacco daily 03/05/2015  . Generalized anxiety disorder 03/05/2015  . Acid reflux 03/05/2015  . HLD (hyperlipidemia) 03/05/2015  . Affective disorder, major 03/05/2015  . Bad memory 03/05/2015  . Arthritis, degenerative 03/05/2015  . OP (osteoporosis) 03/05/2015  . Renovascular hypertension 03/05/2015  . Scoliosis 03/05/2015  . Avitaminosis D 03/05/2015  . Weight loss 11/07/2014  . Family history of colon cancer 11/07/2014   Phillips Grout PT,  DPT, GCS  Sarah [name] 01/11/2020, 11:20 AM  [place] MAIN [facility] SERVICES [address] [place], [place], [address] Phone: [phone]   Fax:  [phone]  Name: Sarah [name] MRN: [id number] Date of Birth: 12-Mar-1941

## 2020-01-18 ENCOUNTER — Ambulatory Visit: Payer: Medicare Other

## 2020-01-18 ENCOUNTER — Other Ambulatory Visit: Payer: Self-pay

## 2020-01-18 DIAGNOSIS — Z9181 History of falling: Secondary | ICD-10-CM | POA: Diagnosis not present

## 2020-01-18 DIAGNOSIS — R2681 Unsteadiness on feet: Secondary | ICD-10-CM | POA: Diagnosis not present

## 2020-01-18 NOTE — Therapy (Signed)
[place] MAIN [facility] SERVICES [address] [place], [place], [address] Phone: [phone]   Fax:  [phone]  Physical Therapy Progress Note/Recertification/Treatment  Dates of reporting period 10/31/19  to  01/18/20  Patient Details  Name: Sarah [name] MRN: [id number] Date of Birth: 24-Feb-1941 Referring Provider (PT): Dr. Rosanna Randy   Encounter Date: 01/18/2020  PT End of Session - 01/18/20 1025    Visit Number  10    Number of Visits  37    Date for PT Re-Evaluation  04/11/20    Authorization Type  eval: 10/31/19    PT Start Time  0940    PT Stop Time  1018    PT Time Calculation (min)  38 min    Equipment Utilized During Treatment  Gait belt    Activity Tolerance  Patient tolerated treatment well    Behavior During Therapy  [employer] [employer] for tasks assessed/performed       Past Medical History:  Diagnosis Date  . Arthritis   . Dementia ([place])    ALZHEIMERS  . Ear anomaly    INNER EAR ISSUES  . Hyperlipidemia   . Hypertension     Past Surgical History:  Procedure Laterality Date  . APPENDECTOMY    . CATARACT EXTRACTION W/PHACO Right 03/09/2018   Procedure: CATARACT EXTRACTION PHACO AND INTRAOCULAR LENS PLACEMENT (IOC);  Surgeon: Birder Robson, MD;  Location: [facility] ORS;  Service: Ophthalmology;  Laterality: Right;  [place] 00:38.9 AP% 15.9 CDE 6.19 FLUID PACK LOT # [id number]  . CATARACT EXTRACTION W/PHACO Left 06/22/2018   Procedure: CATARACT EXTRACTION PHACO AND INTRAOCULAR LENS PLACEMENT (IOC);  Surgeon: Birder Robson, MD;  Location: [facility] ORS;  Service: Ophthalmology;  Laterality: Left;  [place] 00:50 AP% 16.5 CDE 8.25 Fluid pack lot # [id number]  . CESAREAN SECTION    . CHOLECYSTECTOMY    . CYSTOSCOPY WITH STENT PLACEMENT      There were no vitals filed for this visit.  Subjective Assessment - 01/18/20 0942    Subjective  Pt reports that she is doing well today. She continues to complain of chronic L knee pain once/twice per day after  prolonged sitting. No complaints upon arrival today. No falls or stumbles to report. No specific questions or concerns    Pertinent History  Sarah [name] is a 79 y.o. female with a history of severe Alzheimer's dementia, hypertension who was brought to the ED due to a fall at home. She hit the back of her head resulting in bleeding.  She was treated with staples to close her wound and had a follow-up appointment with her PCP who ordered PT due to fall. Per grandson patient's Alzheimer's has progressed significantly over the last few months. He states that she likes to walk but is unstable. She has had 2 falls in the last 6 months. Per grandson at baseline she is AOx1 and is able to recognize her son and grandson with whom she lives. She does not participate in any regular exercise.    Limitations  Walking    Patient Stated Goals  Improve balance and safety    Currently in Pain?  No/denies         [facility] PT Assessment - 01/18/20 0959      Berg Balance Test   Sit to Stand  Able to stand without using hands and stabilize independently    Standing Unsupported  Able to stand 2 minutes with supervision    Sitting with Back Unsupported but Feet Supported  on Floor or Stool  Able to sit safely and securely 2 minutes    Stand to Sit  Controls descent by using hands    Transfers  Able to transfer safely, definite need of hands    Standing Unsupported with Eyes Closed  Able to stand 10 seconds with supervision    Standing Unsupported with Feet Together  Needs help to attain position but able to stand for 30 seconds with feet together    From Standing, Reach Forward with Outstretched Arm  Can reach forward >12 cm safely (5")    From Standing Position, Pick up Object from Floor  Able to pick up shoe safely and easily    From Standing Position, Turn to Look Behind Over each Shoulder  Turn sideways only but maintains balance    Turn 360 Degrees  Able to turn 360 degrees safely but slowly    Standing  Unsupported, Alternately Place Feet on Step/Stool  Able to complete >2 steps/needs minimal assist    Standing Unsupported, One Foot in Front  Able to take small step independently and hold 30 seconds    Standing on One Leg  Tries to lift leg/unable to hold 3 seconds but remains standing independently    Total Score  36        TREATMENT   Neuromuscular Re-education  Updated outcome measures and goals with patient (see below)    Results Comments  BERG 36/56 (30/56) Fall risk, in need of intervention  TUG 32.8s (44.3 seconds) Fall risk, in need of intervention  10 Meter Gait Speed Self-selected: 29.4s =  0.34 m/s; (Self-selected: 57.4s =  0.17 m/s) Below normative values for full community ambulation, high fall risk   Therapist danced with patientto tango musicin order to work on balance. Performedside stepping in both directions,forward/backward stepping, 180/360 degree turns, and cross-over stepsto both directions;   Ther-ex NuStep L0x 5 minutes BUE/BLE, extensive redirection and assistance provided to continue movement of LE.  Verbal/visual/tactile cues provided, PT also demonstrated/performed exercises with pt to facilitate: Seated marches with 3# ankle weights (AW)x 15 bilateral; Standing marches with 3# AW x 15 bilateral; Standing heel raises with BUE support x 10;  Forward 6" step-ups with BUE support and 3# AW x 10;   Pt educated throughout session about proper posture and technique with exercises. Improved exercise technique, movement at target joints, use of target muscles after min to mod verbal, visual, tactile cues.   Updated outcome measure sand goals with patient today. She demonstrates very significant improvements since first starting therapy. Her BERG improved from 30/56 at initial evaluation to 36/56 today. Her TUG dropped from 44.3s to 32.8s and her self selected gait speed doubled from 0.17 m/s to 0.34 m/s. She continues to require repeated  redirection during session due to advanced dementia.She demonstrates good motivation during sessions. Grandson reports that he has noticed improvement at home with her balance and gait speed.She will continue to benefit from PT services to address deficits in strength, balance, and mobility in order to return to full function at home and decrease her risk for falls.                       PT Short Term Goals - 01/18/20 1026      PT SHORT TERM GOAL #1   Title  Pt will be participate with HEP at the direction of her caregiver in order to improve strength and balance in order to decrease fall risk and  improve function at home.    Time  6    Period  Weeks    Status  Partially Met    Target Date  02/29/20        PT Long Term Goals - 01/18/20 1027      PT LONG TERM GOAL #1   Title  Pt will improve BERG by to at least 40/56 in order to demonstrate clinically significant improvement in balance.    Baseline  10/31/19: 30/56; 01/18/20: 36/56    Time  12    Period  Weeks    Status  Partially Met    Target Date  04/11/20      PT LONG TERM GOAL #2   Title  Pt will decrease TUG to below 14 seconds/decrease in order to demonstrate decreased fall risk.    Baseline  10/31/19: 44.3s; 01/18/20: 32.8s    Time  12    Period  Weeks    Status  Partially Met    Target Date  04/11/20      PT LONG TERM GOAL #3   Title  Pt will increase 10MWT to at least 0.50 m/s in order to demonstrate clinically significant improvement in community ambulation.    Baseline  10/31/19: Self-selected: 57.4s =  0.17 m/s; 01/18/20: Self-selected: 29.4s =  0.34 m/s;    Time  12    Period  Weeks    Status  Partially Met    Target Date  04/11/20      PT LONG TERM GOAL #4   Title  Pt will be able to perform sit to stand from a regular height chair without UE support in order to demonstrate improved leg strength and safety    Baseline  10/31/19: Heavy UE dependence with sit to stand transfers; 01/18/20: Able to  stand without UE reliance however prefers to place hands on arm rests    Time  12    Period  Weeks    Status  Achieved            Plan - 01/18/20 1026    Clinical Impression Statement  Updated outcome measure sand goals with patient today. She demonstrates very significant improvements since first starting therapy. Her BERG improved from 30/56 at initial evaluation to 36/56 today. Her TUG dropped from 44.3s to 32.8s and her self selected gait speed doubled from 0.17 m/s to 0.34 m/s. She continues to require repeated redirection during session due to advanced dementia. She demonstrates good motivation during sessions. Grandson reports that he has noticed improvement at home with her balance and gait speed. She will continue to benefit from PT services to address deficits in strength, balance, and mobility in order to return to full function at home and decrease her risk for falls.    Personal Factors and Comorbidities  Age;Comorbidity 3+    Comorbidities  Alzheimer's, anxiety/depression, arthritis    Examination-Activity Limitations  Bathing;Bed Mobility;Caring for Others;Carry;Dressing;Hygiene/Grooming;Lift;Locomotion Level;Squat;Stand;Transfers    Examination-Participation Restrictions  Community Activity;Driving;Interpersonal Relationship;Laundry;Medication Management;Meal Prep;Personal Finances;Shop    Stability/Clinical Decision Making  Unstable/Unpredictable    Rehab Potential  Fair    PT Frequency  1x / week    PT Duration  12 weeks    PT Treatment/Interventions  ADLs/Self Care Home Management;Aquatic Therapy;Biofeedback;Canalith Repostioning;Cryotherapy;Electrical Stimulation;Iontophoresis 67m/ml Dexamethasone;Moist Heat;Traction;Ultrasound;DME Instruction;Gait training;Stair training;Functional mobility training;Therapeutic activities;Therapeutic exercise;Balance training;Neuromuscular re-education;Cognitive remediation;Patient/family education;Manual techniques;Passive range of  motion;Dry needling;Vestibular;Joint Manipulations    PT Next Visit Plan  [place] balance and strengthening    PT Home Exercise  Plan  seated marches, seated clams, seated LAQ, feet together balance, slow marching, sit to stand with minimal UE dependendence    Consulted and Agree with Plan of Care  Family member/caregiver    Family Member Consulted  Sarah [name]       Patient will benefit from skilled therapeutic intervention in order to improve the following deficits and impairments:  Abnormal gait, Decreased balance, Decreased cognition, Decreased knowledge of precautions, Decreased safety awareness, Decreased strength, Difficulty walking  Visit Diagnosis: Unsteadiness on feet  History of falling     Problem List Patient Active Problem List   Diagnosis Date Noted  . Chronic kidney disease due to hypertension 07/24/2019  . Mesenteric artery stenosis ([place]) 03/24/2019  . Renal artery stenosis ([place]) 01/26/2017  . Personal history of tobacco use, presenting hazards to health 06/23/2016  . Depressive disorder 05/29/2015  . Dementia ([place]) 05/29/2015  . Allergic rhinitis 03/05/2015  . Weak pulse 03/05/2015  . Smokes tobacco daily 03/05/2015  . Generalized anxiety disorder 03/05/2015  . Acid reflux 03/05/2015  . HLD (hyperlipidemia) 03/05/2015  . Affective disorder, major 03/05/2015  . Bad memory 03/05/2015  . Arthritis, degenerative 03/05/2015  . OP (osteoporosis) 03/05/2015  . Renovascular hypertension 03/05/2015  . Scoliosis 03/05/2015  . Avitaminosis D 03/05/2015  . Weight loss 11/07/2014  . Family history of colon cancer 11/07/2014   Phillips Grout PT, DPT, GCS  Alexei Ey 01/18/2020, 10:38 AM  [place] MAIN [facility] SERVICES [address] [place], [place], [address] Phone: [phone]   Fax:  [phone]  Name: Sarah [name] MRN: [id number] Date of Birth: 10-07-1941

## 2020-01-20 NOTE — Progress Notes (Signed)
Patient: Sarah [name] Female    DOB: 11-29-1940   79 y.o.   MRN: [id number] Visit Date: 01/24/2020  Today's Provider: Wilhemena Durie, MD   Chief Complaint  Patient presents with  . Weight Loss   Subjective:     HPI   Patient presents today for a follow up on weight loss.  She is off of Seroquel. Her weight is up to 103 pounds after being 95 on her  last visit.  Late onset Alzheimer's disease with behavioral disturbance ([place]) From 11/22/2019-New onset behavioral disturbance. May need memory care unit. Given rx for QUEtiapine (SEROQUEL) 25 mg.  Weight loss From 11/22/2019-Stable.    Allergies  Allergen Reactions  . Penicillins Hives and Other (See Comments)    Has patient had a PCN reaction causing immediate rash, facial/tongue/throat swelling, SOB or lightheadedness with hypotension: No Has patient had a PCN reaction causing severe rash involving mucus membranes or skin necrosis: No Has patient had a PCN reaction that required hospitalization: No Has patient had a PCN reaction occurring within the last 10 years: No  If all of the above answers are "NO", then may proceed with Cephalosporin use.      Current Outpatient Medications:  .  acetaminophen (TYLENOL) 500 MG tablet, Take 500 mg by mouth at bedtime. , Disp: , Rfl:  .  CALCIUM CARBONATE-VITAMIN D PO, Take 1 tablet by mouth 4 (four) times a week. , Disp: , Rfl:  .  cholecalciferol (VITAMIN D) 1000 units tablet, Take 1,000 Units by mouth 4 (four) times a week. , Disp: , Rfl:  .  Cobalamin Combinations (FOLTRATE) 500-1 MCG-MG TABS, Take by mouth., Disp: , Rfl:  .  cyanocobalamin 1000 MCG tablet, Take 1,000 mcg by mouth 4 (four) times a week. , Disp: , Rfl:  .  vitamin C (ASCORBIC ACID) 500 MG tablet, Take 500 mg by mouth daily., Disp: , Rfl:  .  vitamin E 400 UNIT capsule, Take 400 Units by mouth 4 (four) times a week. , Disp: , Rfl:  .  aspirin EC 81 MG tablet, Take 81 mg by mouth daily., Disp: , Rfl:  .   lisinopril (ZESTRIL) 20 MG tablet, Take 1 Tablet By Mouth Daily., Disp: 30 tablet, Rfl: 2 .  pantoprazole (PROTONIX) 40 MG tablet, Take 1 tablet (40 mg total) by mouth daily. (Patient not taking: Reported on 10/31/2019), Disp: 30 tablet, Rfl: 3 .  Probiotic CAPS, Take 1 capsule by mouth daily., Disp: , Rfl:  .  QUEtiapine (SEROQUEL) 25 MG tablet, Take 1 tablet (25 mg total) by mouth at bedtime. (Patient not taking: Reported on 01/24/2020), Disp: 30 tablet, Rfl: 5 .  saccharomyces boulardii (FLORASTOR) 250 MG capsule, Take by mouth., Disp: , Rfl:   Review of Systems  Constitutional: Negative for appetite change, chills, fatigue and fever.  HENT: Negative.   Eyes: Negative.   Respiratory: Negative for chest tightness and shortness of breath.   Cardiovascular: Negative for chest pain and palpitations.  Gastrointestinal: Negative for abdominal pain, nausea and vomiting.  Endocrine: Negative.   Genitourinary: Negative.   Musculoskeletal: Negative.   Allergic/Immunologic: Negative.   Neurological: Negative for dizziness and weakness.       Chronic dementia without behavioral disturbance.  Psychiatric/Behavioral: Negative.     Social History   Tobacco Use  . Smoking status: Former Smoker    Packs/day: 0.50    Years: 40.00    Pack years: 20.00    Types: Cigarettes  .  Smokeless tobacco: Never Used  . Tobacco comment: less than  Substance Use Topics  . Alcohol use: No    Alcohol/week: 0.0 standard drinks      Objective:   BP (!) 197/82 (BP Location: Right Arm, Patient Position: Sitting, Cuff Size: Normal)   Pulse (!) 58   Temp (!) 97.1 F (36.2 C) (Temporal)   Wt 103 lb 3.2 oz (46.8 kg)   BMI 22.33 kg/m  Vitals:   01/24/20 1109  BP: (!) 197/82  Pulse: (!) 58  Temp: (!) 97.1 F (36.2 C)  TempSrc: Temporal  Weight: 103 lb 3.2 oz (46.8 kg)  Body mass index is 22.33 kg/m.   Physical Exam Vitals reviewed.  Constitutional:      General: She is not in acute distress.     Appearance: She is not ill-appearing, toxic-appearing or diaphoretic.     Comments: Cachectic female in no acute distress.  HENT:     Head: Normocephalic and atraumatic.     Mouth/Throat:     Mouth: Mucous membranes are moist.     Pharynx: No oropharyngeal exudate.  Cardiovascular:     Rate and Rhythm: Normal rate and regular rhythm.  Pulmonary:     Effort: Pulmonary effort is normal. No respiratory distress.     Breath sounds: Normal breath sounds. No stridor. No wheezing, rhonchi or rales.  Chest:     Chest wall: No tenderness.  Abdominal:     Palpations: Abdomen is soft.  Musculoskeletal:     Cervical back: Normal range of motion.  Skin:    General: Skin is warm and dry.     Capillary Refill: Capillary refill takes less than 2 seconds.  Neurological:     Mental Status: She is alert and oriented to person, place, and time. Mental status is at baseline.  Psychiatric:        Mood and Affect: Mood normal.        Behavior: Behavior normal.      No results found for any visits on 01/24/20.     Assessment & Plan    1. Late onset Alzheimer's disease without behavioral disturbance ([place]) At home and as long as she does not have behavioral issues I think she can be managed.  I am worried about her wandering. Discussed most forms at next visit 2. Weight loss Improved since last visit.  Follow clinically. Family does not wish any aggressive work-up.  3. Chronic kidney disease due to hypertension   4. Osteoarthritis, unspecified osteoarthritis type, unspecified site   5. Osteoporosis, unspecified osteoporosis type, unspecified pathological fracture presence   6. Renovascular hypertension   7. Malnutrition of moderate degree (H[facility]) Improved weight gain        Wilhemena Durie, MD  [facility]

## 2020-01-24 ENCOUNTER — Ambulatory Visit (INDEPENDENT_AMBULATORY_CARE_PROVIDER_SITE_OTHER): Payer: Medicare Other | Admitting: Family Medicine

## 2020-01-24 ENCOUNTER — Other Ambulatory Visit: Payer: Self-pay

## 2020-01-24 ENCOUNTER — Encounter: Payer: Self-pay | Admitting: Family Medicine

## 2020-01-24 VITALS — BP 197/82 | HR 58 | Temp 97.1°F | Wt 103.2 lb

## 2020-01-24 DIAGNOSIS — M1990 Unspecified osteoarthritis, unspecified site: Secondary | ICD-10-CM

## 2020-01-24 DIAGNOSIS — R634 Abnormal weight loss: Secondary | ICD-10-CM

## 2020-01-24 DIAGNOSIS — I10 Essential (primary) hypertension: Secondary | ICD-10-CM

## 2020-01-24 DIAGNOSIS — M810 Age-related osteoporosis without current pathological fracture: Secondary | ICD-10-CM | POA: Diagnosis not present

## 2020-01-24 DIAGNOSIS — G301 Alzheimer's disease with late onset: Secondary | ICD-10-CM

## 2020-01-24 DIAGNOSIS — I129 Hypertensive chronic kidney disease with stage 1 through stage 4 chronic kidney disease, or unspecified chronic kidney disease: Secondary | ICD-10-CM

## 2020-01-24 DIAGNOSIS — E440 Moderate protein-calorie malnutrition: Secondary | ICD-10-CM

## 2020-01-24 DIAGNOSIS — I701 Atherosclerosis of renal artery: Secondary | ICD-10-CM

## 2020-01-24 DIAGNOSIS — F0280 Dementia in other diseases classified elsewhere without behavioral disturbance: Secondary | ICD-10-CM

## 2020-01-24 DIAGNOSIS — I150 Renovascular hypertension: Secondary | ICD-10-CM

## 2020-01-24 MED ORDER — LISINOPRIL 20 MG PO TABS: ORAL_TABLET | ORAL | 2 refills | Status: DC

## 2020-01-24 NOTE — Patient Instructions (Signed)
Call Attorney Velna Hatchet [phone]!!!

## 2020-01-25 ENCOUNTER — Ambulatory Visit: Payer: Medicare Other

## 2020-01-25 ENCOUNTER — Other Ambulatory Visit: Payer: Self-pay

## 2020-01-25 VITALS — BP 189/97 | HR 70

## 2020-01-25 DIAGNOSIS — Z9181 History of falling: Secondary | ICD-10-CM

## 2020-01-25 DIAGNOSIS — R2681 Unsteadiness on feet: Secondary | ICD-10-CM

## 2020-01-25 DIAGNOSIS — E440 Moderate protein-calorie malnutrition: Secondary | ICD-10-CM | POA: Insufficient documentation

## 2020-01-25 NOTE — Therapy (Signed)
[place] MAIN [facility] SERVICES [address] [place], [place], [address] Phone: [phone]   Fax:  [phone]  Physical Therapy Treatment  Patient Details  Name: Sarah [name] MRN: [id number] Date of Birth: 1941-09-26 Referring Provider (PT): Dr. Rosanna Randy   Encounter Date: 01/25/2020  PT End of Session - 01/25/20 1250    Visit Number  11    Number of Visits  37    Date for PT Re-Evaluation  04/11/20    Authorization Type  eval: 10/31/19    PT Start Time  0940    PT Stop Time  1015    PT Time Calculation (min)  35 min    Equipment Utilized During Treatment  Gait belt    Activity Tolerance  Patient tolerated treatment well    Behavior During Therapy  [facility] for tasks assessed/performed       Past Medical History:  Diagnosis Date  . Arthritis   . Dementia ([place])    ALZHEIMERS  . Ear anomaly    INNER EAR ISSUES  . Hyperlipidemia   . Hypertension     Past Surgical History:  Procedure Laterality Date  . APPENDECTOMY    . CATARACT EXTRACTION W/PHACO Right 03/09/2018   Procedure: CATARACT EXTRACTION PHACO AND INTRAOCULAR LENS PLACEMENT (IOC);  Surgeon: Birder Robson, MD;  Location: [facility] ORS;  Service: Ophthalmology;  Laterality: Right;  [place] 00:38.9 AP% 15.9 CDE 6.19 FLUID PACK LOT # [id number]  . CATARACT EXTRACTION W/PHACO Left 06/22/2018   Procedure: CATARACT EXTRACTION PHACO AND INTRAOCULAR LENS PLACEMENT (IOC);  Surgeon: Birder Robson, MD;  Location: [facility] ORS;  Service: Ophthalmology;  Laterality: Left;  [place] 00:50 AP% 16.5 CDE 8.25 Fluid pack lot # [id number] H  . CESAREAN SECTION    . CHOLECYSTECTOMY    . CYSTOSCOPY WITH STENT PLACEMENT      Vitals:   01/25/20 0944  BP: (!) 189/97  Pulse: 70  SpO2: 99%    Subjective Assessment - 01/25/20 0955    Subjective  Pt reports that she is doing well today. No complaints of pain upon arrival and no obvious signs of pain/distress. She saw Dr. Rosanna Randy yesterday and per grandson they  agreed to discontinue the Seroquel as well as restart her blood pressure medication. No falls or stumbles to report per grandson. No specific questions or concerns    Pertinent History  Sarah [name] is a 79 y.o. female with a history of severe Alzheimer's dementia, hypertension who was brought to the ED due to a fall at home. She hit the back of her head resulting in bleeding.  She was treated with staples to close her wound and had a follow-up appointment with her PCP who ordered PT due to fall. Per grandson patient's Alzheimer's has progressed significantly over the last few months. He states that she likes to walk but is unstable. She has had 2 falls in the last 6 months. Per grandson at baseline she is AOx1 and is able to recognize her son and grandson with whom she lives. She does not participate in any regular exercise.    Limitations  Walking    Patient Stated Goals  Improve balance and safety    Currently in Pain?  No/denies          TREATMENT   Ther-ex NuStep L0x 5 minutes BUE/BLE, extensive redirection and assistance provided to continue movement of LE.  Verbal/visual/tactile cues provided, PT also demonstrated/performed exercises with pt to facilitate: Seated marches with 3# ankle weights (  AW)x 10 bilateral; Seated LAQ with 3# AWx 10 bilateral; Seated clams with manual resistance x 10; Seated adductor squeeze with manual resistance x 10; Standing marches with 3# AW x 10 bilateral; Sit to stand with BUE support from therapist x 10;   Neuromuscular Re-education: CGA and handheld assist provided for all Airex balance balloon taps with grandson x 5 minutes; Therapist danced with patientto tango musicin order to work on balance. Performedside stepping in both directions,forward/backward stepping, 180/360 degree turns, and cross-over stepsto both directions;   Pt educated throughout session about proper posture and technique with exercises. Improved exercise  technique, movement at target joints, use of target muscles after min to mod verbal, visual, tactile cues.   Pt requires repeated redirection during session due to advanced dementia.She continues to demonstrate good motivation during sessions but struggles with some strengthening so creative activities incorporated. Once again utilized dancing for balance training today and also performed Airex balance during balloon passes with grandson.Pt will benefit from PT services to address deficits in strength, balance, and mobility in order to return to full function at home.                        PT Short Term Goals - 01/18/20 1026      PT SHORT TERM GOAL #1   Title  Pt will be participate with HEP at the direction of her caregiver in order to improve strength and balance in order to decrease fall risk and improve function at home.    Time  6    Period  Weeks    Status  Partially Met    Target Date  02/29/20        PT Long Term Goals - 01/18/20 1027      PT LONG TERM GOAL #1   Title  Pt will improve BERG by to at least 40/56 in order to demonstrate clinically significant improvement in balance.    Baseline  10/31/19: 30/56; 01/18/20: 36/56    Time  12    Period  Weeks    Status  Partially Met    Target Date  04/11/20      PT LONG TERM GOAL #2   Title  Pt will decrease TUG to below 14 seconds/decrease in order to demonstrate decreased fall risk.    Baseline  10/31/19: 44.3s; 01/18/20: 32.8s    Time  12    Period  Weeks    Status  Partially Met    Target Date  04/11/20      PT LONG TERM GOAL #3   Title  Pt will increase 10MWT to at least 0.50 m/s in order to demonstrate clinically significant improvement in community ambulation.    Baseline  10/31/19: Self-selected: 57.4s =  0.17 m/s; 01/18/20: Self-selected: 29.4s =  0.34 m/s;    Time  12    Period  Weeks    Status  Partially Met    Target Date  04/11/20      PT LONG TERM GOAL #4   Title  Pt will be able to  perform sit to stand from a regular height chair without UE support in order to demonstrate improved leg strength and safety    Baseline  10/31/19: Heavy UE dependence with sit to stand transfers; 01/18/20: Able to stand without UE reliance however prefers to place hands on arm rests    Time  12    Period  Weeks    Status  Achieved  Plan - 01/25/20 1251    Clinical Impression Statement  Pt requires repeated redirection during session due to advanced dementia. She continues to demonstrate good motivation during sessions but struggles with some strengthening so creative activities incorporated. Once again utilized dancing for balance training today and also performed Airex balance during balloon passes with grandson. Pt will benefit from PT services to address deficits in strength, balance, and mobility in order to return to full function at home.    Personal Factors and Comorbidities  Age;Comorbidity 3+    Comorbidities  Alzheimer's, anxiety/depression, arthritis    Examination-Activity Limitations  Bathing;Bed Mobility;Caring for Others;Carry;Dressing;Hygiene/Grooming;Lift;Locomotion Level;Squat;Stand;Transfers    Examination-Participation Restrictions  Community Activity;Driving;Interpersonal Relationship;Laundry;Medication Management;Meal Prep;Personal Finances;Shop    Stability/Clinical Decision Making  Unstable/Unpredictable    Rehab Potential  Fair    PT Frequency  1x / week    PT Duration  12 weeks    PT Treatment/Interventions  ADLs/Self Care Home Management;Aquatic Therapy;Biofeedback;Canalith Repostioning;Cryotherapy;Electrical Stimulation;Iontophoresis 58m/ml Dexamethasone;Moist Heat;Traction;Ultrasound;DME Instruction;Gait training;Stair training;Functional mobility training;Therapeutic activities;Therapeutic exercise;Balance training;Neuromuscular re-education;Cognitive remediation;Patient/family education;Manual techniques;Passive range of motion;Dry  needling;Vestibular;Joint Manipulations    PT Next Visit Plan  Progres balance and strengthening    PT Home Exercise Plan  seated marches, seated clams, seated LAQ, feet together balance, slow marching, sit to stand with minimal UE dependendence    Consulted and Agree with Plan of Care  Family member/caregiver    Family Member Consulted  GAdolphus Birchwood      Patient will benefit from skilled therapeutic intervention in order to improve the following deficits and impairments:  Abnormal gait, Decreased balance, Decreased cognition, Decreased knowledge of precautions, Decreased safety awareness, Decreased strength, Difficulty walking  Visit Diagnosis: Unsteadiness on feet  History of falling     Problem List Patient Active Problem List   Diagnosis Date Noted  . Malnutrition of moderate degree ([place] 01/25/2020  . Chronic kidney disease due to hypertension 07/24/2019  . Mesenteric artery stenosis ([place] 03/24/2019  . Renal artery stenosis ([place] 01/26/2017  . Personal history of tobacco use, presenting hazards to health 06/23/2016  . Depressive disorder 05/29/2015  . Dementia ([place] 05/29/2015  . Allergic rhinitis 03/05/2015  . Weak pulse 03/05/2015  . Smokes tobacco daily 03/05/2015  . Generalized anxiety disorder 03/05/2015  . Acid reflux 03/05/2015  . HLD (hyperlipidemia) 03/05/2015  . Affective disorder, major 03/05/2015  . Bad memory 03/05/2015  . Arthritis, degenerative 03/05/2015  . OP (osteoporosis) 03/05/2015  . Renovascular hypertension 03/05/2015  . Scoliosis 03/05/2015  . Avitaminosis D 03/05/2015  . Weight loss 11/07/2014  . Family history of colon cancer 11/07/2014   JPhillips GroutPT, DPT, GCS  Yahaira Bruski 01/25/2020, 12:55 PM  [place]MAIN [facility] CenterSERV[facility] [address] [address]Phone: [phone]  Fax:  [phone] Name: Sarah MCLELLANDMRN: [id number] of Birth: [birth year]

## 2020-02-01 ENCOUNTER — Ambulatory Visit: Payer: Medicare Other

## 2020-02-08 ENCOUNTER — Ambulatory Visit: Payer: Medicare Other | Attending: Family Medicine

## 2020-02-08 ENCOUNTER — Other Ambulatory Visit: Payer: Self-pay

## 2020-02-08 VITALS — BP 167/69 | HR 54

## 2020-02-08 DIAGNOSIS — R2681 Unsteadiness on feet: Secondary | ICD-10-CM | POA: Insufficient documentation

## 2020-02-08 DIAGNOSIS — Z9181 History of falling: Secondary | ICD-10-CM | POA: Diagnosis not present

## 2020-02-08 NOTE — Therapy (Signed)
[place] MAIN [facility] SERVICES [address] [place], [place], [address] Phone: [phone]   Fax:  [phone]  Physical Therapy Treatment  Patient Details  Name: Sarah [name] MRN: [id number] Date of Birth: 18-Feb-1941 Referring Provider (PT): Dr. Rosanna Randy   Encounter Date: 02/08/2020  PT End of Session - 02/08/20 0956    Visit Number  12    Number of Visits  37    Date for PT Re-Evaluation  04/11/20    Authorization Type  eval: 10/31/19    PT Start Time  0940    PT Stop Time  1020    PT Time Calculation (min)  40 min    Equipment Utilized During Treatment  Gait belt    Activity Tolerance  Patient tolerated treatment well    Behavior During Therapy  [facility] for tasks assessed/performed       Past Medical History:  Diagnosis Date  . Arthritis   . Dementia ([place])    ALZHEIMERS  . Ear anomaly    INNER EAR ISSUES  . Hyperlipidemia   . Hypertension     Past Surgical History:  Procedure Laterality Date  . APPENDECTOMY    . CATARACT EXTRACTION W/PHACO Right 03/09/2018   Procedure: CATARACT EXTRACTION PHACO AND INTRAOCULAR LENS PLACEMENT (IOC);  Surgeon: Birder Robson, MD;  Location: [facility] ORS;  Service: Ophthalmology;  Laterality: Right;  [place] 00:38.9 AP% 15.9 CDE 6.19 FLUID PACK LOT # [id number] H  . CATARACT EXTRACTION W/PHACO Left 06/22/2018   Procedure: CATARACT EXTRACTION PHACO AND INTRAOCULAR LENS PLACEMENT (IOC);  Surgeon: Birder Robson, MD;  Location: [facility] ORS;  Service: Ophthalmology;  Laterality: Left;  [place] 00:50 AP% 16.5 CDE 8.25 Fluid pack lot # [id number] H  . CESAREAN SECTION    . CHOLECYSTECTOMY    . CYSTOSCOPY WITH STENT PLACEMENT      Vitals:   02/08/20 0943  BP: (!) 167/69  Pulse: (!) 54  SpO2: 98%    Subjective Assessment - 02/08/20 0942    Subjective  Pt reports that she is doing well today. No complaints of pain upon arrival and no obvious signs of pain/distress. She restarted her BP medications over the last  couple week. One very mild fall where she slid off the edge of the bed but no injury. No specific questions or concerns    Pertinent History  Sarah [name] is a 79 y.o. female with a history of severe Alzheimer's dementia, hypertension who was brought to the ED due to a fall at home. She hit the back of her head resulting in bleeding.  She was treated with staples to close her wound and had a follow-up appointment with her PCP who ordered PT due to fall. Per grandson patient's Alzheimer's has progressed significantly over the last few months. He states that she likes to walk but is unstable. She has had 2 falls in the last 6 months. Per grandson at baseline she is AOx1 and is able to recognize her son and grandson with whom she lives. She does not participate in any regular exercise.    Limitations  Walking    Patient Stated Goals  Improve balance and safety    Currently in Pain?  No/denies           TREATMENT   Ther-ex NuStep L0x 5 minutes BUE/BLE, extensive redirection and assistance provided to continue movement of LE, history obtained from grandson during warm-up;  Verbal/visual/tactile cues provided, PT also demonstrated/performed exercises with pt to facilitate: Seated marches  with4#ankle weights (AW)x 10 bilateral; Seated LAQ with4#AWx 10bilateral; Seated clams with manual resistance x 10; Seated adductor squeeze with manual resistance x 10; Standing marches with 4# AW x 10 bilateral; Sit to stand with BUE support from therapist x 10;   Neuromuscular Re-education: CGA and handheld assist provided for all Airex balance balloon taps with grandson x 5 minutes; Soccer ball kicks with no UE support and 4# ankle weights x 5 minutes; Obstacle course with pt stepping over 1/2 foam roll (x2), up/down Airex pad, and up down 6" step performed 4 times with patient; Therapist danced with patientto tango musicin order to work on balance. Performedside stepping in both  directions,forward/backward stepping, 180/360 degree turns, and cross-over stepsto both directions;   Pt educated throughout session about proper posture and technique with exercises. Improved exercise technique, movement at target joints, use of target muscles after min to mod verbal, visual, tactile cues.   Pt requires repeated redirection during session due to advanced dementia.She continues to demonstrate good motivation during sessions butstruggleswith some strengthening so creative activities incorporated. She demonstrates improved stability today with soccer ball kicks, balloon taps, and dancing. Once again utilized dancing for balance trainingtoday and also performed Airex balance during balloon passes with grandson.Pt will benefit from PT services to address deficits in strength, balance, and mobility in order to return to full function at home.                      PT Short Term Goals - 01/18/20 1026      PT SHORT TERM GOAL #1   Title  Pt will be participate with HEP at the direction of her caregiver in order to improve strength and balance in order to decrease fall risk and improve function at home.    Time  6    Period  Weeks    Status  Partially Met    Target Date  02/29/20        PT Long Term Goals - 01/18/20 1027      PT LONG TERM GOAL #1   Title  Pt will improve BERG by to at least 40/56 in order to demonstrate clinically significant improvement in balance.    Baseline  10/31/19: 30/56; 01/18/20: 36/56    Time  12    Period  Weeks    Status  Partially Met    Target Date  04/11/20      PT LONG TERM GOAL #2   Title  Pt will decrease TUG to below 14 seconds/decrease in order to demonstrate decreased fall risk.    Baseline  10/31/19: 44.3s; 01/18/20: 32.8s    Time  12    Period  Weeks    Status  Partially Met    Target Date  04/11/20      PT LONG TERM GOAL #3   Title  Pt will increase 10MWT to at least 0.50 m/s in order to demonstrate  clinically significant improvement in community ambulation.    Baseline  10/31/19: Self-selected: 57.4s =  0.17 m/s; 01/18/20: Self-selected: 29.4s =  0.34 m/s;    Time  12    Period  Weeks    Status  Partially Met    Target Date  04/11/20      PT LONG TERM GOAL #4   Title  Pt will be able to perform sit to stand from a regular height chair without UE support in order to demonstrate improved leg strength and safety  Baseline  10/31/19: Heavy UE dependence with sit to stand transfers; 01/18/20: Able to stand without UE reliance however prefers to place hands on arm rests    Time  12    Period  Weeks    Status  Achieved            Plan - 02/08/20 1100    Clinical Impression Statement  Pt requires repeated redirection during session due to advanced dementia. She continues to demonstrate good motivation during sessions but struggles with some strengthening so creative activities incorporated. She demonstrates improved stability today with soccer ball kicks, balloon taps, and dancing. Once again utilized dancing for balance training today and also performed Airex balance during balloon passes with grandson. Pt will benefit from PT services to address deficits in strength, balance, and mobility in order to return to full function at home.    Personal Factors and Comorbidities  Age;Comorbidity 3+    Comorbidities  Alzheimer's, anxiety/depression, arthritis    Examination-Activity Limitations  Bathing;Bed Mobility;Caring for Others;Carry;Dressing;Hygiene/Grooming;Lift;Locomotion Level;Squat;Stand;Transfers    Examination-Participation Restrictions  Community Activity;Driving;Interpersonal Relationship;Laundry;Medication Management;Meal Prep;Personal Finances;Shop    Stability/Clinical Decision Making  Unstable/Unpredictable    Rehab Potential  Fair    PT Frequency  1x / week    PT Duration  12 weeks    PT Treatment/Interventions  ADLs/Self Care Home Management;Aquatic Therapy;Biofeedback;Canalith  Repostioning;Cryotherapy;Electrical Stimulation;Iontophoresis 21m/ml Dexamethasone;Moist Heat;Traction;Ultrasound;DME Instruction;Gait training;Stair training;Functional mobility training;Therapeutic activities;Therapeutic exercise;Balance training;Neuromuscular re-education;Cognitive remediation;Patient/family education;Manual techniques;Passive range of motion;Dry needling;Vestibular;Joint Manipulations    PT Next Visit Plan  Progres balance and strengthening    PT Home Exercise Plan  seated marches, seated clams, seated LAQ, feet together balance, slow marching, sit to stand with minimal UE dependendence    Consulted and Agree with Plan of Care  Family member/caregiver    Family Member Consulted  GAdolphus Birchwood      Patient will benefit from skilled therapeutic intervention in order to improve the following deficits and impairments:  Abnormal gait, Decreased balance, Decreased cognition, Decreased knowledge of precautions, Decreased safety awareness, Decreased strength, Difficulty walking  Visit Diagnosis: Unsteadiness on feet  History of falling     Problem List Patient Active Problem List   Diagnosis Date Noted  . Malnutrition of moderate degree ([place] 01/25/2020  . Chronic kidney disease due to hypertension 07/24/2019  . Mesenteric artery stenosis (HFort [place] 03/24/2019  . Renal artery stenosis (HEs[place] 01/26/2017  . Personal history of tobacco use, presenting hazards to health 06/23/2016  . Depressive disorder 05/29/2015  . Dementia ([place] 05/29/2015  . Allergic rhinitis 03/05/2015  . Weak pulse 03/05/2015  . Smokes tobacco daily 03/05/2015  . Generalized anxiety disorder 03/05/2015  . Acid reflux 03/05/2015  . HLD (hyperlipidemia) 03/05/2015  . Affective disorder, major 03/05/2015  . Bad memory 03/05/2015  . Arthritis, degenerative 03/05/2015  . OP (osteoporosis) 03/05/2015  . Renovascular hypertension 03/05/2015  . Scoliosis 03/05/2015  . Avitaminosis D 03/05/2015  . Weight  loss 11/07/2014  . Family history of colon cancer 11/07/2014   JPhillips GroutPT, DPT, GCS  , 02/08/2020, 11:22 AM  [place]MAIN RMi[facility] CenterSERVICES [address] [address]Phone: [phone]  Fax:  [phone] Name: MREHMAT MURTAGHMRN: [id number] of Birth: [birth year]

## 2020-02-15 ENCOUNTER — Ambulatory Visit: Payer: Medicare Other

## 2020-02-15 ENCOUNTER — Other Ambulatory Visit: Payer: Self-pay

## 2020-02-15 VITALS — BP 167/57 | HR 52

## 2020-02-15 DIAGNOSIS — R2681 Unsteadiness on feet: Secondary | ICD-10-CM

## 2020-02-15 DIAGNOSIS — Z9181 History of falling: Secondary | ICD-10-CM

## 2020-02-15 NOTE — Therapy (Signed)
[place] MAIN [facility] SERVICES [address] [place], [place], [address] Phone: [phone]   Fax:  [phone]  Physical Therapy Treatment  Patient Details  Name: Sarah [name] MRN: [id number] Date of Birth: [birth year] Referring Provider (PT): Dr. Rosanna Randy   Encounter Date: 02/15/2020  PT End of Session - 02/15/20 1048    Visit Number  13    Number of Visits  37    Date for PT Re-Evaluation  04/11/20    Authorization Type  eval: 10/31/19    PT Start Time  0940    PT Stop Time  1025    PT Time Calculation (min)  45 min    Equipment Utilized During Treatment  Gait belt    Activity Tolerance  Patient tolerated treatment well    Behavior During Therapy  [facility] for tasks assessed/performed       Past Medical History:  Diagnosis Date  . Arthritis   . Dementia ([place])    ALZHEIMERS  . Ear anomaly    INNER EAR ISSUES  . Hyperlipidemia   . Hypertension     Past Surgical History:  Procedure Laterality Date  . APPENDECTOMY    . CATARACT EXTRACTION W/PHACO Right 03/09/2018   Procedure: CATARACT EXTRACTION PHACO AND INTRAOCULAR LENS PLACEMENT (IOC);  Surgeon: Birder Robson, MD;  Location: [facility] ORS;  Service: Ophthalmology;  Laterality: Right;  [place] 00:38.9 AP% 15.9 CDE 6.19 FLUID PACK LOT # [id number]  . CATARACT EXTRACTION W/PHACO Left 06/22/2018   Procedure: CATARACT EXTRACTION PHACO AND INTRAOCULAR LENS PLACEMENT (IOC);  Surgeon: Birder Robson, MD;  Location: [facility] ORS;  Service: Ophthalmology;  Laterality: Left;  [place] 00:50 AP% 16.5 CDE 8.25 Fluid pack lot # [id number]  . CESAREAN SECTION    . CHOLECYSTECTOMY    . CYSTOSCOPY WITH STENT PLACEMENT      Vitals:   02/15/20 0946  BP: (!) 167/57  Pulse: (!) 52  SpO2: 99%    Subjective Assessment - 02/15/20 1043    Subjective  Pt reports that she is doing well today. No complaints of pain upon arrival and no obvious signs of pain/distress. Sarah [name] reports that she has had 2 falls  since last visit but no significant injury. She has been complaining of some R lateral lower leg pain since the fall but has been able to walk without issue. No specific questions or concerns.    Pertinent History  Sarah [name] is a 79 y.o. female with a history of severe Alzheimer's dementia, hypertension who was brought to the ED due to a fall at home. She hit the back of her head resulting in bleeding.  She was treated with staples to close her wound and had a follow-up appointment with her PCP who ordered PT due to fall. Per grandson patient's Alzheimer's has progressed significantly over the last few months. He states that she likes to walk but is unstable. She has had 2 falls in the last 6 months. Per grandson at baseline she is AOx1 and is able to recognize her son and grandson with whom she lives. She does not participate in any regular exercise.    Limitations  Walking    Patient Stated Goals  Improve balance and safety    Currently in Pain?  No/denies          TREATMENT   Ther-ex NuStep L0/1x 5 minutes BUE/BLE, extensive redirection and assistance provided to continue movement of LE, history obtained from grandson during warm-up;  Verbal/visual/tactile cues  provided, PT also demonstrated/performed exercises with pt to facilitate: Seated marches with5#ankle weights (AW)x10bilateral; Seated LAQ with5#AWx 10bilateral; Seated clams with manual resistance x 10; Seated adductor squeeze with manual resistance x 10; Standing marches with 5# AW x 10 bilateral; Sit to stand with BUE support from therapist x 10; Standing mini squats x 10 with BUE handheld support;   Neuromuscular Re-education: CGA and handheld assist provided for exercises Balloon taps with grandson x 5 minutes; Red mat walking with single UE support forward, backward, and sideways; Therapist danced with patientto tango musicin order to work on balance. Performedside stepping in both  directions,forward/backward stepping, 180/360 degree turns, and cross-over stepsto both directions;   Pt educated throughout session about proper posture and technique with exercises. Improved exercise technique, movement at target joints, use of target muscles after min to mod verbal, visual, tactile cues.   Pt requires repeated redirection during session due to advanced dementia.She continues to demonstrate good motivation during sessions butstruggleswith some strengthening so creative activities incorporated. She demonstrates slightly more anxiety today with balance exercises. Minor wincing along anterior tibialis muscle but no pain with palpation to fibula. Once again utilized dancing for balance trainingtodayand also performed Airex balance during balloon passes with grandson.Pt will benefit from PT services to address deficits in strength, balance, and mobility in order to return to full function at home.                       PT Short Term Goals - 01/18/20 1026      PT SHORT TERM GOAL #1   Title  Pt will be participate with HEP at the direction of her caregiver in order to improve strength and balance in order to decrease fall risk and improve function at home.    Time  6    Period  Weeks    Status  Partially Met    Target Date  02/29/20        PT Long Term Goals - 01/18/20 1027      PT LONG TERM GOAL #1   Title  Pt will improve BERG by to at least 40/56 in order to demonstrate clinically significant improvement in balance.    Baseline  10/31/19: 30/56; 01/18/20: 36/56    Time  12    Period  Weeks    Status  Partially Met    Target Date  04/11/20      PT LONG TERM GOAL #2   Title  Pt will decrease TUG to below 14 seconds/decrease in order to demonstrate decreased fall risk.    Baseline  10/31/19: 44.3s; 01/18/20: 32.8s    Time  12    Period  Weeks    Status  Partially Met    Target Date  04/11/20      PT LONG TERM GOAL #3   Title  Pt will  increase 10MWT to at least 0.50 m/s in order to demonstrate clinically significant improvement in community ambulation.    Baseline  10/31/19: Self-selected: 57.4s =  0.17 m/s; 01/18/20: Self-selected: 29.4s =  0.34 m/s;    Time  12    Period  Weeks    Status  Partially Met    Target Date  04/11/20      PT LONG TERM GOAL #4   Title  Pt will be able to perform sit to stand from a regular height chair without UE support in order to demonstrate improved leg strength and safety    Baseline  10/31/19: Heavy UE dependence with sit to stand transfers; 01/18/20: Able to stand without UE reliance however prefers to place hands on arm rests    Time  12    Period  Weeks    Status  Achieved            Plan - 02/15/20 1048    Clinical Impression Statement  Pt requires repeated redirection during session due to advanced dementia. She continues to demonstrate good motivation during sessions but struggles with some strengthening so creative activities incorporated. She demonstrates slightly more anxiety today with balance exercises. Minor wincing along anterior tibialis muscle but no pain with palpation to fibula. Once again utilized dancing for balance training today and also performed Airex balance during balloon passes with grandson. Pt will benefit from PT services to address deficits in strength, balance, and mobility in order to return to full function at home.    Personal Factors and Comorbidities  Age;Comorbidity 3+    Comorbidities  Alzheimer's, anxiety/depression, arthritis    Examination-Activity Limitations  Bathing;Bed Mobility;Caring for Others;Carry;Dressing;Hygiene/Grooming;Lift;Locomotion Level;Squat;Stand;Transfers    Examination-Participation Restrictions  Community Activity;Driving;Interpersonal Relationship;Laundry;Medication Management;Meal Prep;Personal Finances;Shop    Stability/Clinical Decision Making  Unstable/Unpredictable    Rehab Potential  Fair    PT Frequency  1x / week    PT  Duration  12 weeks    PT Treatment/Interventions  ADLs/Self Care Home Management;Aquatic Therapy;Biofeedback;Canalith Repostioning;Cryotherapy;Electrical Stimulation;Iontophoresis 18m/ml Dexamethasone;Moist Heat;Traction;Ultrasound;DME Instruction;Gait training;Stair training;Functional mobility training;Therapeutic activities;Therapeutic exercise;Balance training;Neuromuscular re-education;Cognitive remediation;Patient/family education;Manual techniques;Passive range of motion;Dry needling;Vestibular;Joint Manipulations    PT Next Visit Plan  Progres balance and strengthening    PT Home Exercise Plan  seated marches, seated clams, seated LAQ, feet together balance, slow marching, sit to stand with minimal UE dependendence    Consulted and Agree with Plan of Care  Family member/caregiver    Family Member Consulted  GAdolphus [name]      Patient will benefit from skilled therapeutic intervention in order to improve the following deficits and impairments:  Abnormal gait, Decreased balance, Decreased cognition, Decreased knowledge of precautions, Decreased safety awareness, Decreased strength, Difficulty walking  Visit Diagnosis: Unsteadiness on feet  History of falling     Problem List Patient Active Problem List   Diagnosis Date Noted  . Malnutrition of moderate degree ([place] 01/25/2020  . Chronic kidney disease due to hypertension 07/24/2019  . Mesenteric artery stenosis ([place] 03/24/2019  . Renal artery stenosis ([place] 01/26/2017  . Personal history of tobacco use, presenting hazards to health 06/23/2016  . Depressive disorder 05/29/2015  . Dementia ([place] 05/29/2015  . Allergic rhinitis 03/05/2015  . Weak pulse 03/05/2015  . Smokes tobacco daily 03/05/2015  . Generalized anxiety disorder 03/05/2015  . Acid reflux 03/05/2015  . HLD (hyperlipidemia) 03/05/2015  . Affective disorder, major 03/05/2015  . Bad memory 03/05/2015  . Arthritis, degenerative 03/05/2015  . OP (osteoporosis)  03/05/2015  . Renovascular hypertension 03/05/2015  . Scoliosis 03/05/2015  . Avitaminosis D 03/05/2015  . Weight loss 11/07/2014  . Family history of colon cancer 11/07/2014   JPhillips GroutPT, DPT, GCS  Shenekia Riess 02/15/2020, 11:02 AM  [place] [facility]MAIN [facility] LtdSERVICES [address]RRay[address] [place] [address]Phone: [phone]  Fax:  [phone] Name: MKASIAH MANKAMRN: [id number] of Birth: [birth year]

## 2020-02-22 ENCOUNTER — Other Ambulatory Visit: Payer: Self-pay

## 2020-02-22 ENCOUNTER — Ambulatory Visit: Payer: Medicare Other

## 2020-02-22 VITALS — BP 157/66 | HR 56

## 2020-02-22 DIAGNOSIS — R2681 Unsteadiness on feet: Secondary | ICD-10-CM

## 2020-02-22 DIAGNOSIS — Z9181 History of falling: Secondary | ICD-10-CM | POA: Diagnosis not present

## 2020-02-22 NOTE — Therapy (Signed)
[place] MAIN [facility] SERVICES [address] [place], [place], [address] Phone: [phone]   Fax:  [phone]  Physical Therapy Treatment  Patient Details  Name: Sarah [name] MRN: [id number] Date of Birth: [birth year] Referring Provider (PT): Dr. Rosanna Randy   Encounter Date: 02/22/2020  PT End of Session - 02/22/20 1236    Visit Number  14    Number of Visits  37    Date for PT Re-Evaluation  04/11/20    Authorization Type  eval: 10/31/19    PT Start Time  0930    PT Stop Time  1015    PT Time Calculation (min)  45 min    Equipment Utilized During Treatment  Gait belt    Activity Tolerance  Patient tolerated treatment well    Behavior During Therapy  [facility] for tasks assessed/performed       Past Medical History:  Diagnosis Date  . Arthritis   . Dementia ([place])    ALZHEIMERS  . Ear anomaly    INNER EAR ISSUES  . Hyperlipidemia   . Hypertension     Past Surgical History:  Procedure Laterality Date  . APPENDECTOMY    . CATARACT EXTRACTION W/PHACO Right 03/09/2018   Procedure: CATARACT EXTRACTION PHACO AND INTRAOCULAR LENS PLACEMENT (IOC);  Surgeon: Birder Robson, MD;  Location: [facility] ORS;  Service: Ophthalmology;  Laterality: Right;  [place] 00:38.9 AP% 15.9 CDE 6.19 FLUID PACK LOT # [id number]  . CATARACT EXTRACTION W/PHACO Left 06/22/2018   Procedure: CATARACT EXTRACTION PHACO AND INTRAOCULAR LENS PLACEMENT (IOC);  Surgeon: Birder Robson, MD;  Location: [facility] ORS;  Service: Ophthalmology;  Laterality: Left;  [place] 00:50 AP% 16.5 CDE 8.25 Fluid pack lot # [id number] H  . CESAREAN SECTION    . CHOLECYSTECTOMY    . CYSTOSCOPY WITH STENT PLACEMENT      Vitals:   02/22/20 0936  BP: (!) 157/66  Pulse: (!) 56  SpO2: 100%    Subjective Assessment - 02/22/20 0938    Subjective  Pt reports that she is doing well today. No complaints of pain upon arrival and no obvious signs of pain/distress. Sarah [name] reports that she has not had any  falls since last visit but she has been complaining more frequently this past week of pain in her L knee. No specific questions or concerns.    Pertinent History  Sarah [name] is a 79 y.o. female with a history of severe Alzheimer's dementia, hypertension who was brought to the ED due to a fall at home. She hit the back of her head resulting in bleeding.  She was treated with staples to close her wound and had a follow-up appointment with her PCP who ordered PT due to fall. Per grandson patient's Alzheimer's has progressed significantly over the last few months. He states that she likes to walk but is unstable. She has had 2 falls in the last 6 months. Per grandson at baseline she is AOx1 and is able to recognize her son and grandson with whom she lives. She does not participate in any regular exercise.    Limitations  Walking    Patient Stated Goals  Improve balance and safety    Currently in Pain?  No/denies          TREATMENT   Ther-ex NuStep L0/1x 5 minutes BUE/BLE, extensive redirection and assistance provided to continue movement of LE, history obtained from grandson during warm-up;  Verbal/visual/tactile cues provided, PT also demonstrated/performed exercises with pt to facilitate:  Seated marches with4#ankle weights (AW)x15bilateral; Seated LAQ with4#AWx 15bilateral; Seated clams with manual resistance x 15; Seated adductor squeeze with manual resistance x 15; Standing marches with5# AW x 15 bilateral; Sit to stand with BUE support from therapist x 10;   Neuromuscular Re-education: CGA and handheld assist provided for exercises Balloon taps with grandson with pt standing on red mat x 5 minutes; Soccer ball kicks with grandson x 3 minutes; Soccer ball kicks with grandson while pt stands on red mat x 2 minutes; Basketball toss with pt standing on red mat x 3 minutes, gradually progressed distance of basketball hoop away from patient; Therapist danced with  patientto tango musicin order to work on balance. Performedside stepping in both directions,forward/backward stepping, 180/360 degree turns, and cross-over stepsto both directions;   Pt educated throughout session about proper posture and technique with exercises. Improved exercise technique, movement at target joints, use of target muscles after min to mod verbal, visual, tactile cues.   Pt requires repeated redirection during session due to advanced dementia.She continues to demonstrate good motivation during sessions butstruggleswith some strengthening so creative activities incorporated. Today is a better day for patient with respect to willingness to attempt more challenging balance activities. She demonstrates fair balance with basketball tosses while stanidng on red mat. Once again utilized dancing for balance trainingtoday at end of session.Pt will benefit from PT services to address deficits in strength, balance, and mobility in order to return to full function at home.                       PT Short Term Goals - 01/18/20 1026      PT SHORT TERM GOAL #1   Title  Pt will be participate with HEP at the direction of her caregiver in order to improve strength and balance in order to decrease fall risk and improve function at home.    Time  6    Period  Weeks    Status  Partially Met    Target Date  02/29/20        PT Long Term Goals - 01/18/20 1027      PT LONG TERM GOAL #1   Title  Pt will improve BERG by to at least 40/56 in order to demonstrate clinically significant improvement in balance.    Baseline  10/31/19: 30/56; 01/18/20: 36/56    Time  12    Period  Weeks    Status  Partially Met    Target Date  04/11/20      PT LONG TERM GOAL #2   Title  Pt will decrease TUG to below 14 seconds/decrease in order to demonstrate decreased fall risk.    Baseline  10/31/19: 44.3s; 01/18/20: 32.8s    Time  12    Period  Weeks    Status  Partially Met     Target Date  04/11/20      PT LONG TERM GOAL #3   Title  Pt will increase 10MWT to at least 0.50 m/s in order to demonstrate clinically significant improvement in community ambulation.    Baseline  10/31/19: Self-selected: 57.4s =  0.17 m/s; 01/18/20: Self-selected: 29.4s =  0.34 m/s;    Time  12    Period  Weeks    Status  Partially Met    Target Date  04/11/20      PT LONG TERM GOAL #4   Title  Pt will be able to perform sit to stand from  a regular height chair without UE support in order to demonstrate improved leg strength and safety    Baseline  10/31/19: Heavy UE dependence with sit to stand transfers; 01/18/20: Able to stand without UE reliance however prefers to place hands on arm rests    Time  12    Period  Weeks    Status  Achieved            Plan - 02/22/20 1237    Clinical Impression Statement  Pt requires repeated redirection during session due to advanced dementia. She continues to demonstrate good motivation during sessions but struggles with some strengthening so creative activities incorporated. Today is a better day for patient with respect to willingness to attempt more challenging balance activities. She demonstrates fair balance with basketball tosses while stanidng on red mat.  Once again utilized dancing for balance training today at end of session. Pt will benefit from PT services to address deficits in strength, balance, and mobility in order to return to full function at home.    Personal Factors and Comorbidities  Age;Comorbidity 3+    Comorbidities  Alzheimer's, anxiety/depression, arthritis    Examination-Activity Limitations  Bathing;Bed Mobility;Caring for Others;Carry;Dressing;Hygiene/Grooming;Lift;Locomotion Level;Squat;Stand;Transfers    Examination-Participation Restrictions  Community Activity;Driving;Interpersonal Relationship;Laundry;Medication Management;Meal Prep;Personal Finances;Shop    Stability/Clinical Decision Making  Unstable/Unpredictable     Rehab Potential  Fair    PT Frequency  1x / week    PT Duration  12 weeks    PT Treatment/Interventions  ADLs/Self Care Home Management;Aquatic Therapy;Biofeedback;Canalith Repostioning;Cryotherapy;Electrical Stimulation;Iontophoresis 76m/ml Dexamethasone;Moist Heat;Traction;Ultrasound;DME Instruction;Gait training;Stair training;Functional mobility training;Therapeutic activities;Therapeutic exercise;Balance training;Neuromuscular re-education;Cognitive remediation;Patient/family education;Manual techniques;Passive range of motion;Dry needling;Vestibular;Joint Manipulations    PT Next Visit Plan  Progres balance and strengthening    PT Home Exercise Plan  seated marches, seated clams, seated LAQ, feet together balance, slow marching, sit to stand with minimal UE dependendence    Consulted and Agree with Plan of Care  Family member/caregiver    Family Member Consulted  GAdolphus [name]      Patient will benefit from skilled therapeutic intervention in order to improve the following deficits and impairments:  Abnormal gait, Decreased balance, Decreased cognition, Decreased knowledge of precautions, Decreased safety awareness, Decreased strength, Difficulty walking  Visit Diagnosis: Unsteadiness on feet  History of falling     Problem List Patient Active Problem List   Diagnosis Date Noted  . Malnutrition of moderate degree ([place] 01/25/2020  . Chronic kidney disease due to hypertension 07/24/2019  . Mesenteric artery stenosis ([place] 03/24/2019  . Renal artery stenosis ([place] 01/26/2017  . Personal history of tobacco use, presenting hazards to health 06/23/2016  . Depressive disorder 05/29/2015  . Dementia ([place] 05/29/2015  . Allergic rhinitis 03/05/2015  . Weak pulse 03/05/2015  . Smokes tobacco daily 03/05/2015  . Generalized anxiety disorder 03/05/2015  . Acid reflux 03/05/2015  . HLD (hyperlipidemia) 03/05/2015  . Affective disorder, major 03/05/2015  . Bad memory 03/05/2015   . Arthritis, degenerative 03/05/2015  . OP (osteoporosis) 03/05/2015  . Renovascular hypertension 03/05/2015  . Scoliosis 03/05/2015  . Avitaminosis D 03/05/2015  . Weight loss 11/07/2014  . Family history of colon cancer 11/07/2014   JPhillips GroutPT, DPT, GCS  Alantra Popoca 02/22/2020, 12:45 PM  [place]MAIN RMedical Plaza End[facility] LLCSERVICES [address] [address]Phone: [phone]  Fax:  [phone] Name: MKATHARYN SCHAUERMRN: [id number] of Birth: 701-18-1942

## 2020-02-29 ENCOUNTER — Other Ambulatory Visit: Payer: Self-pay

## 2020-02-29 ENCOUNTER — Ambulatory Visit: Payer: Medicare Other | Attending: Family Medicine

## 2020-02-29 DIAGNOSIS — Z9181 History of falling: Secondary | ICD-10-CM | POA: Diagnosis not present

## 2020-02-29 DIAGNOSIS — R2681 Unsteadiness on feet: Secondary | ICD-10-CM | POA: Insufficient documentation

## 2020-02-29 NOTE — Therapy (Signed)
[place] MAIN [facility] SERVICES [address] [place], [place], [address] Phone: [phone]   Fax:  [phone]  Physical Therapy Treatment  Patient Details  Name: Sarah [name] MRN: [id number] Date of Birth: 05-09-1941 Referring Provider (PT): Dr. Rosanna Randy   Encounter Date: 02/29/2020  PT End of Session - 02/29/20 1011    Visit Number  15    Number of Visits  37    Date for PT Re-Evaluation  04/11/20    Authorization Type  eval: 10/31/19    PT Start Time  0940    PT Stop Time  1025    PT Time Calculation (min)  45 min    Equipment Utilized During Treatment  Gait belt    Activity Tolerance  Patient tolerated treatment well    Behavior During Therapy  [facility] for tasks assessed/performed       Past Medical History:  Diagnosis Date  . Arthritis   . Dementia ([place])    ALZHEIMERS  . Ear anomaly    INNER EAR ISSUES  . Hyperlipidemia   . Hypertension     Past Surgical History:  Procedure Laterality Date  . APPENDECTOMY    . CATARACT EXTRACTION W/PHACO Right 03/09/2018   Procedure: CATARACT EXTRACTION PHACO AND INTRAOCULAR LENS PLACEMENT (IOC);  Surgeon: Birder Robson, MD;  Location: [facility] ORS;  Service: Ophthalmology;  Laterality: Right;  [place] 00:38.9 AP% 15.9 CDE 6.19 FLUID PACK LOT # [id number]  . CATARACT EXTRACTION W/PHACO Left 06/22/2018   Procedure: CATARACT EXTRACTION PHACO AND INTRAOCULAR LENS PLACEMENT (IOC);  Surgeon: Birder Robson, MD;  Location: [facility] ORS;  Service: Ophthalmology;  Laterality: Left;  [place] 00:50 AP% 16.5 CDE 8.25 Fluid pack lot # [id number]  . CESAREAN SECTION    . CHOLECYSTECTOMY    . CYSTOSCOPY WITH STENT PLACEMENT      There were no vitals filed for this visit.  Subjective Assessment - 02/29/20 1125    Subjective  Pt and grandson report that she is doing well today. No complaints of pain upon arrival and no obvious signs of pain/distress. Sarah [name] reports that she has not had any falls since last visit.  No specific questions or concerns at this time.    Pertinent History  Sarah [name] is a 79 y.o. female with a history of severe Alzheimer's dementia, hypertension who was brought to the ED due to a fall at home. She hit the back of her head resulting in bleeding.  She was treated with staples to close her wound and had a follow-up appointment with her PCP who ordered PT due to fall. Per grandson patient's Alzheimer's has progressed significantly over the last few months. He states that she likes to walk but is unstable. She has had 2 falls in the last 6 months. Per grandson at baseline she is AOx1 and is able to recognize her son and grandson with whom she lives. She does not participate in any regular exercise.    Limitations  Walking    Patient Stated Goals  Improve balance and safety    Currently in Pain?  No/denies        TREATMENT   Ther-ex NuStep L0/1x 5 minutes BUE/BLE, extensive redirection and assistance provided to continue movement of LE, history obtained from grandson during warm-up;  Verbal/visual/tactile cues provided, PT also demonstrated/performed exercises with pt to facilitate: Quantum leg press 25# 2 x 15; Seated marches with4#ankle weights (AW)x15bilateral; Seated LAQ with4#AWx 15bilateral; Seated clams with manual resistance x 15;  Seated adductor squeeze with manual resistance x 15; Standing marches with5# AW x 15 bilateral; Sit to stand with BUE support from therapist x 15;   Neuromuscular Re-education: CGA and handheld assist provided forexercises Balloon taps with grandson with pt standing on red mat x 5 minutes; Soccer ball kicks with grandson x 3 minutes; Therapist danced with patientto tango musicin order to work on balance. Performedside stepping in both directions,forward/backward stepping, 180/360 degree turns, and cross-over stepsto both directions;   Pt educated throughout session about proper posture and technique with  exercises. Improved exercise technique, movement at target joints, use of target muscles after min to mod verbal, visual, tactile cues.   Pt requires repeated redirection during session due to advanced dementia.She continues to demonstrate good motivation during sessions butstruggleswith some strengthening so creative activities incorporated. Today is a better day for patient with respect to willingness to attempt more challenging balance activities. Initiated leg press with patient and while she needs some assistance she is able to complete the exercise appropriately.Once again utilized dancing for balance trainingtoday at end of session.Pt will benefit from PT services to address deficits in strength, balance, and mobility in order to return to full function at home.                         PT Short Term Goals - 01/18/20 1026      PT SHORT TERM GOAL #1   Title  Pt will be participate with HEP at the direction of her caregiver in order to improve strength and balance in order to decrease fall risk and improve function at home.    Time  6    Period  Weeks    Status  Partially Met    Target Date  02/29/20        PT Long Term Goals - 01/18/20 1027      PT LONG TERM GOAL #1   Title  Pt will improve BERG by to at least 40/56 in order to demonstrate clinically significant improvement in balance.    Baseline  10/31/19: 30/56; 01/18/20: 36/56    Time  12    Period  Weeks    Status  Partially Met    Target Date  04/11/20      PT LONG TERM GOAL #2   Title  Pt will decrease TUG to below 14 seconds/decrease in order to demonstrate decreased fall risk.    Baseline  10/31/19: 44.3s; 01/18/20: 32.8s    Time  12    Period  Weeks    Status  Partially Met    Target Date  04/11/20      PT LONG TERM GOAL #3   Title  Pt will increase 10MWT to at least 0.50 m/s in order to demonstrate clinically significant improvement in community ambulation.    Baseline  10/31/19:  Self-selected: 57.4s =  0.17 m/s; 01/18/20: Self-selected: 29.4s =  0.34 m/s;    Time  12    Period  Weeks    Status  Partially Met    Target Date  04/11/20      PT LONG TERM GOAL #4   Title  Pt will be able to perform sit to stand from a regular height chair without UE support in order to demonstrate improved leg strength and safety    Baseline  10/31/19: Heavy UE dependence with sit to stand transfers; 01/18/20: Able to stand without UE reliance however prefers to place hands on  arm rests    Time  12    Period  Weeks    Status  Achieved            Plan - 02/29/20 1138    Clinical Impression Statement  Pt requires repeated redirection during session due to advanced dementia. She continues to demonstrate good motivation during sessions but struggles with some strengthening so creative activities incorporated. Today is a better day for patient with respect to willingness to attempt more challenging balance activities. Initiated leg press with patient and while she needs some assistance she is able to complete the exercise appropriately. Once again utilized dancing for balance training today at end of session. Pt will benefit from PT services to address deficits in strength, balance, and mobility in order to return to full function at home.    Personal Factors and Comorbidities  Age;Comorbidity 3+    Comorbidities  Alzheimer's, anxiety/depression, arthritis    Examination-Activity Limitations  Bathing;Bed Mobility;Caring for Others;Carry;Dressing;Hygiene/Grooming;Lift;Locomotion Level;Squat;Stand;Transfers    Examination-Participation Restrictions  Community Activity;Driving;Interpersonal Relationship;Laundry;Medication Management;Meal Prep;Personal Finances;Shop    Stability/Clinical Decision Making  Unstable/Unpredictable    Rehab Potential  Fair    PT Frequency  1x / week    PT Duration  12 weeks    PT Treatment/Interventions  ADLs/Self Care Home Management;Aquatic  Therapy;Biofeedback;Canalith Repostioning;Cryotherapy;Electrical Stimulation;Iontophoresis 57m/ml Dexamethasone;Moist Heat;Traction;Ultrasound;DME Instruction;Gait training;Stair training;Functional mobility training;Therapeutic activities;Therapeutic exercise;Balance training;Neuromuscular re-education;Cognitive remediation;Patient/family education;Manual techniques;Passive range of motion;Dry needling;Vestibular;Joint Manipulations    PT Next Visit Plan  [place] balance and strengthening    PT Home Exercise Plan  seated marches, seated clams, seated LAQ, feet together balance, slow marching, sit to stand with minimal UE dependendence    Consulted and Agree with Plan of Care  Family member/caregiver    Family Member Consulted  Sarah [name]      Patient will benefit from skilled therapeutic intervention in order to improve the following deficits and impairments:  Abnormal gait, Decreased balance, Decreased cognition, Decreased knowledge of precautions, Decreased safety awareness, Decreased strength, Difficulty walking  Visit Diagnosis: Unsteadiness on feet  History of falling     Problem List Patient Active Problem List   Diagnosis Date Noted  . Malnutrition of moderate degree ([place] 01/25/2020  . Chronic kidney disease due to hypertension 07/24/2019  . Mesenteric artery stenosis ([place] 03/24/2019  . Renal artery stenosis ([place] 01/26/2017  . Personal history of tobacco use, presenting hazards to health 06/23/2016  . Depressive disorder 05/29/2015  . Dementia ([place] 05/29/2015  . Allergic rhinitis 03/05/2015  . Weak pulse 03/05/2015  . Smokes tobacco daily 03/05/2015  . Generalized anxiety disorder 03/05/2015  . Acid reflux 03/05/2015  . HLD (hyperlipidemia) 03/05/2015  . Affective disorder, major 03/05/2015  . Bad memory 03/05/2015  . Arthritis, degenerative 03/05/2015  . OP (osteoporosis) 03/05/2015  . Renovascular hypertension 03/05/2015  . Scoliosis 03/05/2015  .  Avitaminosis D 03/05/2015  . Weight loss 11/07/2014  . Family history of colon cancer 11/07/2014   Sarah GroutPT, DPT, GCS  Quanisha Drewry 02/29/2020, 11:52 AM  [place] [facility] [address]RAliquippa [place] [address]Phone: [phone]  Fax:  [phone] Name: MTRENESHA ALCAIDEMRN: [id number] of Birth: 701/24/1942

## 2020-03-07 ENCOUNTER — Other Ambulatory Visit: Payer: Self-pay

## 2020-03-07 ENCOUNTER — Ambulatory Visit: Payer: Medicare Other

## 2020-03-07 DIAGNOSIS — Z9181 History of falling: Secondary | ICD-10-CM | POA: Diagnosis not present

## 2020-03-07 DIAGNOSIS — R2681 Unsteadiness on feet: Secondary | ICD-10-CM | POA: Diagnosis not present

## 2020-03-07 NOTE — Therapy (Signed)
[place] MAIN [facility] SERVICES [address] [place], [place], [address] Phone: [phone]   Fax:  [phone]  Physical Therapy Treatment  Patient Details  Name: Sarah [name] MRN: [id number] Date of Birth: 07-31-1941 Referring Provider (PT): Dr. Rosanna Randy   Encounter Date: 03/07/2020  PT End of Session - 03/07/20 1234    Visit Number  16    Number of Visits  37    Date for PT Re-Evaluation  04/11/20    Authorization Type  eval: 10/31/19    PT Start Time  0937    PT Stop Time  1025    PT Time Calculation (min)  48 min    Equipment Utilized During Treatment  Gait belt    Activity Tolerance  Patient tolerated treatment well    Behavior During Therapy  [facility] for tasks assessed/performed       Past Medical History:  Diagnosis Date  . Arthritis   . Dementia ([place])    ALZHEIMERS  . Ear anomaly    INNER EAR ISSUES  . Hyperlipidemia   . Hypertension     Past Surgical History:  Procedure Laterality Date  . APPENDECTOMY    . CATARACT EXTRACTION W/PHACO Right 03/09/2018   Procedure: CATARACT EXTRACTION PHACO AND INTRAOCULAR LENS PLACEMENT (IOC);  Surgeon: Birder Robson, MD;  Location: [facility] ORS;  Service: Ophthalmology;  Laterality: Right;  [place] 00:38.9 AP% 15.9 CDE 6.19 FLUID PACK LOT # [id number]  . CATARACT EXTRACTION W/PHACO Left 06/22/2018   Procedure: CATARACT EXTRACTION PHACO AND INTRAOCULAR LENS PLACEMENT (IOC);  Surgeon: Birder Robson, MD;  Location: [facility] ORS;  Service: Ophthalmology;  Laterality: Left;  [place] 00:50 AP% 16.5 CDE 8.25 Fluid pack lot # [id number]  . CESAREAN SECTION    . CHOLECYSTECTOMY    . CYSTOSCOPY WITH STENT PLACEMENT      There were no vitals filed for this visit.  Subjective Assessment - 03/07/20 0942    Subjective  Pt and grandson report that she is doing well today. No complaints of pain upon arrival and no obvious signs of pain/distress. Sarah [name] reports that she has not had any falls since last  visit. No specific questions or concerns at this time. She continues to have her chronic L knee pain but no complaints at rest.    Pertinent History  Sarah [name] is a 79 y.o. female with a history of severe Alzheimer's dementia, hypertension who was brought to the ED due to a fall at home. She hit the back of her head resulting in bleeding.  She was treated with staples to close her wound and had a follow-up appointment with her PCP who ordered PT due to fall. Per grandson patient's Alzheimer's has progressed significantly over the last few months. He states that she likes to walk but is unstable. She has had 2 falls in the last 6 months. Per grandson at baseline she is AOx1 and is able to recognize her son and grandson with whom she lives. She does not participate in any regular exercise.    Limitations  Walking    Patient Stated Goals  Improve balance and safety    Currently in Pain?  No/denies        TREATMENT   Ther-ex NuStep L0/1x 5 minutes BUE/BLE, extensive redirection and assistance provided to continue movement of LE, history obtained from grandson during warm-up;  Verbal/visual/tactile cues provided, PT also demonstrated/performed exercises with pt to facilitate: Quantum leg press 30# x 10, 25# 2 x  10; Seated marchesx15bilateral; Standing marches withx 15 bilateral; Sit to stand with BUE support from therapist x 10; Attempted floor to stand transfer on red mat however despite multiple attempts at cues pt unable to follow cues to get into quadruped position so therapist and grandson assisted back to standing;   Neuromuscular Re-education: CGA and handheld assist provided forexercises Soccer ball kicks with grandson x 5 minutes; Balloon taps with grandson with pt standing on red mat x 5 minutes; Basketball tosses with pt standing on red bat x 5 minutes; Therapist danced with patientto tango musicin order to work on balance. Performedside stepping in both  directions,forward/backward stepping, 180/360 degree turns, and cross-over stepsto both directions;   Pt educated throughout session about proper posture and technique with exercises. Improved exercise technique, movement at target joints, use of target muscles after min to mod verbal, visual, tactile cues.   Pt requires repeated redirection during session due to advanced dementia.She continues to demonstrate good motivation during sessions butstruggleswith some strengthening so creative activities incorporated. She struggles more with her balance today on the unstable red mat. She requires continual encouragement due to anxiety over falls while standing on mat. Continued leg press with patient and while she needs some assistance she is able to complete the exercise appropriately.Attempted to increase resistance to 30# today however it is very challenging for patient so second and third sets performed at 25#. Once again utilized dancing for balance trainingtoday at end of session.Pt will benefit from PT services to address deficits in strength, balance, and mobility in order to return to full function at home.                          PT Short Term Goals - 01/18/20 1026      PT SHORT TERM GOAL #1   Title  Pt will be participate with HEP at the direction of her caregiver in order to improve strength and balance in order to decrease fall risk and improve function at home.    Time  6    Period  Weeks    Status  Partially Met    Target Date  02/29/20        PT Long Term Goals - 01/18/20 1027      PT LONG TERM GOAL #1   Title  Pt will improve BERG by to at least 40/56 in order to demonstrate clinically significant improvement in balance.    Baseline  10/31/19: 30/56; 01/18/20: 36/56    Time  12    Period  Weeks    Status  Partially Met    Target Date  04/11/20      PT LONG TERM GOAL #2   Title  Pt will decrease TUG to below 14 seconds/decrease in order to  demonstrate decreased fall risk.    Baseline  10/31/19: 44.3s; 01/18/20: 32.8s    Time  12    Period  Weeks    Status  Partially Met    Target Date  04/11/20      PT LONG TERM GOAL #3   Title  Pt will increase 10MWT to at least 0.50 m/s in order to demonstrate clinically significant improvement in community ambulation.    Baseline  10/31/19: Self-selected: 57.4s =  0.17 m/s; 01/18/20: Self-selected: 29.4s =  0.34 m/s;    Time  12    Period  Weeks    Status  Partially Met    Target Date  04/11/20  PT LONG TERM GOAL #4   Title  Pt will be able to perform sit to stand from a regular height chair without UE support in order to demonstrate improved leg strength and safety    Baseline  10/31/19: Heavy UE dependence with sit to stand transfers; 01/18/20: Able to stand without UE reliance however prefers to place hands on arm rests    Time  12    Period  Weeks    Status  Achieved            Plan - 03/07/20 1235    Clinical Impression Statement  Pt requires repeated redirection during session due to advanced dementia. She continues to demonstrate good motivation during sessions but struggles with some strengthening so creative activities incorporated. She struggles more with her balance today on the unstable red mat. She requires continual encouragement due to anxiety over falls while standing on mat. Continued leg press with patient and while she needs some assistance she is able to complete the exercise appropriately. Attempted to increase resistance to 30# today however it is very challenging for patient so second and third sets performed at 25#. Once again utilized dancing for balance training today at end of session. Pt will benefit from PT services to address deficits in strength, balance, and mobility in order to return to full function at home.    Personal Factors and Comorbidities  Age;Comorbidity 3+    Comorbidities  Alzheimer's, anxiety/depression, arthritis    Examination-Activity  Limitations  Bathing;Bed Mobility;Caring for Others;Carry;Dressing;Hygiene/Grooming;Lift;Locomotion Level;Squat;Stand;Transfers    Examination-Participation Restrictions  Community Activity;Driving;Interpersonal Relationship;Laundry;Medication Management;Meal Prep;Personal Finances;Shop    Stability/Clinical Decision Making  Unstable/Unpredictable    Rehab Potential  Fair    PT Frequency  1x / week    PT Duration  12 weeks    PT Treatment/Interventions  ADLs/Self Care Home Management;Aquatic Therapy;Biofeedback;Canalith Repostioning;Cryotherapy;Electrical Stimulation;Iontophoresis 57m/ml Dexamethasone;Moist Heat;Traction;Ultrasound;DME Instruction;Gait training;Stair training;Functional mobility training;Therapeutic activities;Therapeutic exercise;Balance training;Neuromuscular re-education;Cognitive remediation;Patient/family education;Manual techniques;Passive range of motion;Dry needling;Vestibular;Joint Manipulations    PT Next Visit Plan  [place] balance and strengthening    PT Home Exercise Plan  seated marches, seated clams, seated LAQ, feet together balance, slow marching, sit to stand with minimal UE dependendence    Consulted and Agree with Plan of Care  Family member/caregiver    Family Member Consulted  GAdolphus [name]      Patient will benefit from skilled therapeutic intervention in order to improve the following deficits and impairments:  Abnormal gait, Decreased balance, Decreased cognition, Decreased knowledge of precautions, Decreased safety awareness, Decreased strength, Difficulty walking  Visit Diagnosis: Unsteadiness on feet  History of falling     Problem List Patient Active Problem List   Diagnosis Date Noted  . Malnutrition of moderate degree ([place] 01/25/2020  . Chronic kidney disease due to hypertension 07/24/2019  . Mesenteric artery stenosis ([place] 03/24/2019  . Renal artery stenosis ([place] 01/26/2017  . Personal history of tobacco use, presenting hazards to  health 06/23/2016  . Depressive disorder 05/29/2015  . Dementia ([place] 05/29/2015  . Allergic rhinitis 03/05/2015  . Weak pulse 03/05/2015  . Smokes tobacco daily 03/05/2015  . Generalized anxiety disorder 03/05/2015  . Acid reflux 03/05/2015  . HLD (hyperlipidemia) 03/05/2015  . Affective disorder, major 03/05/2015  . Bad memory 03/05/2015  . Arthritis, degenerative 03/05/2015  . OP (osteoporosis) 03/05/2015  . Renovascular hypertension 03/05/2015  . Scoliosis 03/05/2015  . Avitaminosis D 03/05/2015  . Weight loss 11/07/2014  . Family history of colon cancer 11/07/2014  Phillips Grout PT, DPT, GCS  Sarah [name] 03/07/2020, 12:41 PM  [place] MAIN [facility] SERVICES [address] [place], [place], [address] Phone: [phone]   Fax:  [phone]  Name: Sarah [name] MRN: [id number] Date of Birth: [birth year]

## 2020-03-14 ENCOUNTER — Other Ambulatory Visit: Payer: Self-pay

## 2020-03-14 ENCOUNTER — Ambulatory Visit: Payer: Medicare Other

## 2020-03-14 DIAGNOSIS — R2681 Unsteadiness on feet: Secondary | ICD-10-CM | POA: Diagnosis not present

## 2020-03-14 DIAGNOSIS — Z9181 History of falling: Secondary | ICD-10-CM | POA: Diagnosis not present

## 2020-03-14 NOTE — Therapy (Signed)
[place] MAIN [facility] SERVICES [address] [place], [place], [address] Phone: [phone]   Fax:  [phone]  Physical Therapy Treatment  Patient Details  Name: Sarah [name] MRN: [id number] Date of Birth: 1940/12/01 Referring Provider (PT): Dr. Rosanna Randy   Encounter Date: 03/14/2020  PT End of Session - 03/14/20 0941    Visit Number  17    Number of Visits  37    Date for PT Re-Evaluation  04/11/20    Authorization Type  eval: 10/31/19    PT Start Time  0943    PT Stop Time  1015    PT Time Calculation (min)  32 min    Equipment Utilized During Treatment  Gait belt    Activity Tolerance  Patient tolerated treatment well    Behavior During Therapy  [facility] for tasks assessed/performed       Past Medical History:  Diagnosis Date  . Arthritis   . Dementia ([place])    ALZHEIMERS  . Ear anomaly    INNER EAR ISSUES  . Hyperlipidemia   . Hypertension     Past Surgical History:  Procedure Laterality Date  . APPENDECTOMY    . CATARACT EXTRACTION W/PHACO Right 03/09/2018   Procedure: CATARACT EXTRACTION PHACO AND INTRAOCULAR LENS PLACEMENT (IOC);  Surgeon: Birder Robson, MD;  Location: [facility] ORS;  Service: Ophthalmology;  Laterality: Right;  [place] 00:38.9 AP% 15.9 CDE 6.19 FLUID PACK LOT # [id number]  . CATARACT EXTRACTION W/PHACO Left 06/22/2018   Procedure: CATARACT EXTRACTION PHACO AND INTRAOCULAR LENS PLACEMENT (IOC);  Surgeon: Birder Robson, MD;  Location: [facility] ORS;  Service: Ophthalmology;  Laterality: Left;  [place] 00:50 AP% 16.5 CDE 8.25 Fluid pack lot # [id number]  . CESAREAN SECTION    . CHOLECYSTECTOMY    . CYSTOSCOPY WITH STENT PLACEMENT      There were no vitals filed for this visit.  Subjective Assessment - 03/14/20 0940    Subjective  Pt and grandson report that she is doing well today. No complaints of pain upon arrival and no obvious signs of pain/distress. [name] reports that she has not had any falls since last  visit. No specific questions or concerns at this time. She continues to have her chronic L knee pain which she has been complaining about a little more this week. No resting knee pain reported or evident with patient.    Pertinent History  Sarah [name] is a 79 y.o. female with a history of severe Alzheimer's dementia, hypertension who was brought to the ED due to a fall at home. She hit the back of her head resulting in bleeding.  She was treated with staples to close her wound and had a follow-up appointment with her PCP who ordered PT due to fall. Per grandson patient's Alzheimer's has progressed significantly over the last few months. He states that she likes to walk but is unstable. She has had 2 falls in the last 6 months. Per grandson at baseline she is AOx1 and is able to recognize her son and grandson with whom she lives. She does not participate in any regular exercise.    Limitations  Walking    Patient Stated Goals  Improve balance and safety    Currently in Pain?  No/denies          TREATMENT   Ther-ex Verbal/visual/tactile cues provided, PT also demonstrated/performed exercises with pt to facilitate: Quantum leg press 30# x 10, 25# 2 x 10; Sit to stand with  BUE support from therapist x 10; Standing marches with 4# ankle weights (AW)x15bilateral; Seated LAQ with 4# AW x 15 bilateral; Standing heel raises x 15 BLE;   Neuromuscular Re-education: CGA and handheld assist provided forexercises Basketball tosses with pt standing on red bat x 5 minutes; Therapist danced with patientto tango musicin order to work on balance. Performedside stepping in both directions,forward/backward stepping, 180/360 degree turns, and cross-over stepsto both directions;   Pt educated throughout session about proper posture and technique with exercises. Improved exercise technique, movement at target joints, use of target muscles after min to mod verbal, visual, tactile  cues.   Pt requires repeated redirection during session due to advanced dementia.Session started late so session is somewhat abbreviated today. She continues to demonstrate good motivation during sessions butstruggleswith some strengthening so creative activities incorporated. She appears to have a little more anxiety today regarding her balance and falls. Continued leg press with patient and while she needs some assistance she is able to complete the exercise appropriately.Able to intermittently increase resistance today however it is significantly challenging for patient. Once again utilized dancing for balance trainingtodayat end of session.Pt will benefit from PT services to address deficits in strength, balance, and mobility in order to return to full function at home.                         PT Short Term Goals - 01/18/20 1026      PT SHORT TERM GOAL #1   Title  Pt will be participate with HEP at the direction of her caregiver in order to improve strength and balance in order to decrease fall risk and improve function at home.    Time  6    Period  Weeks    Status  Partially Met    Target Date  02/29/20        PT Long Term Goals - 01/18/20 1027      PT LONG TERM GOAL #1   Title  Pt will improve BERG by to at least 40/56 in order to demonstrate clinically significant improvement in balance.    Baseline  10/31/19: 30/56; 01/18/20: 36/56    Time  12    Period  Weeks    Status  Partially Met    Target Date  04/11/20      PT LONG TERM GOAL #2   Title  Pt will decrease TUG to below 14 seconds/decrease in order to demonstrate decreased fall risk.    Baseline  10/31/19: 44.3s; 01/18/20: 32.8s    Time  12    Period  Weeks    Status  Partially Met    Target Date  04/11/20      PT LONG TERM GOAL #3   Title  Pt will increase 10MWT to at least 0.50 m/s in order to demonstrate clinically significant improvement in community ambulation.    Baseline  10/31/19:  Self-selected: 57.4s =  0.17 m/s; 01/18/20: Self-selected: 29.4s =  0.34 m/s;    Time  12    Period  Weeks    Status  Partially Met    Target Date  04/11/20      PT LONG TERM GOAL #4   Title  Pt will be able to perform sit to stand from a regular height chair without UE support in order to demonstrate improved leg strength and safety    Baseline  10/31/19: Heavy UE dependence with sit to stand transfers; 01/18/20: Able to  stand without UE reliance however prefers to place hands on arm rests    Time  12    Period  Weeks    Status  Achieved            Plan - 03/14/20 0941    Clinical Impression Statement  Pt requires repeated redirection during session due to advanced dementia. Session started late so session is somewhat abbreviated today. She continues to demonstrate good motivation during sessions but struggles with some strengthening so creative activities incorporated. She appears to have a little more anxiety today regarding her balance and falls. Continued leg press with patient and while she needs some assistance she is able to complete the exercise appropriately. Able to intermittently increase resistance today however it is significantly challenging for patient. Once again utilized dancing for balance training today at end of session. Pt will benefit from PT services to address deficits in strength, balance, and mobility in order to return to full function at home.    Personal Factors and Comorbidities  Age;Comorbidity 3+    Comorbidities  Alzheimer's, anxiety/depression, arthritis    Examination-Activity Limitations  Bathing;Bed Mobility;Caring for Others;Carry;Dressing;Hygiene/Grooming;Lift;Locomotion Level;Squat;Stand;Transfers    Examination-Participation Restrictions  Community Activity;Driving;Interpersonal Relationship;Laundry;Medication Management;Meal Prep;Personal Finances;Shop    Stability/Clinical Decision Making  Unstable/Unpredictable    Rehab Potential  Fair    PT  Frequency  1x / week    PT Duration  12 weeks    PT Treatment/Interventions  ADLs/Self Care Home Management;Aquatic Therapy;Biofeedback;Canalith Repostioning;Cryotherapy;Electrical Stimulation;Iontophoresis 1m/ml Dexamethasone;Moist Heat;Traction;Ultrasound;DME Instruction;Gait training;Stair training;Functional mobility training;Therapeutic activities;Therapeutic exercise;Balance training;Neuromuscular re-education;Cognitive remediation;Patient/family education;Manual techniques;Passive range of motion;Dry needling;Vestibular;Joint Manipulations    PT Next Visit Plan  Progress balance and strengthening    PT Home Exercise Plan  seated marches, seated clams, seated LAQ, feet together balance, slow marching, sit to stand with minimal UE dependendence    Consulted and Agree with Plan of Care  Family member/caregiver    Family Member Consulted  GAdolphus Birchwood      Patient will benefit from skilled therapeutic intervention in order to improve the following deficits and impairments:  Abnormal gait, Decreased balance, Decreased cognition, Decreased knowledge of precautions, Decreased safety awareness, Decreased strength, Difficulty walking  Visit Diagnosis: Unsteadiness on feet  History of falling     Problem List Patient Active Problem List   Diagnosis Date Noted  . Malnutrition of moderate degree ([place] 01/25/2020  . Chronic kidney disease due to hypertension 07/24/2019  . Mesenteric artery stenosis ([place] 03/24/2019  . Renal artery stenosis ([place] 01/26/2017  . Personal history of tobacco use, presenting hazards to health 06/23/2016  . Depressive disorder 05/29/2015  . Dementia ([place] 05/29/2015  . Allergic rhinitis 03/05/2015  . Weak pulse 03/05/2015  . Smokes tobacco daily 03/05/2015  . Generalized anxiety disorder 03/05/2015  . Acid reflux 03/05/2015  . HLD (hyperlipidemia) 03/05/2015  . Affective disorder, major 03/05/2015  . Bad memory 03/05/2015  . Arthritis, degenerative  03/05/2015  . OP (osteoporosis) 03/05/2015  . Renovascular hypertension 03/05/2015  . Scoliosis 03/05/2015  . Avitaminosis D 03/05/2015  . Weight loss 11/07/2014  . Family history of colon cancer 11/07/2014   JPhillips GroutPT, DPT, GCS  Huprich,Jason 03/14/2020, 10:30 AM  [place] [address] [address]Phone: [phone]  Fax:  [phone] Name: Sarah MACKOWIAKMRN: [id number] of Birth: 701/13/1942

## 2020-03-16 NOTE — Progress Notes (Signed)
Established patient visit   Patient: Sarah [name]   DOB: 06-20-1941   79 y.o. Female  MRN: [id number] Visit Date: 03/22/2020  Today's healthcare provider: Wilhemena Durie, MD   Chief Complaint  Patient presents with  . Follow-up  . Hypertension  . Weight Loss   Subjective    HPI Late onset Alzheimer's disease without behavioral disturbance ([place]) From 01/24/2020-At home and as long as she does not have behavioral issues I think she can be managed.  I am worried about her wandering. Discussed most forms at next visit.  This is stable but progressive.  Weight loss From 01/24/2020-Improved since last visit.  Follow clinically. Family does not wish any aggressive work-up.  Malnutrition of moderate degree ([facility]) From 01/24/2020-Improved weight gain.       Medications: Outpatient Medications Prior to Visit  Medication Sig  . acetaminophen (TYLENOL) 500 MG tablet Take 500 mg by mouth at bedtime.   Marland Kitchen CALCIUM CARBONATE-VITAMIN D PO Take 1 tablet by mouth 4 (four) times a week.   . cholecalciferol (VITAMIN D) 1000 units tablet Take 1,000 Units by mouth 4 (four) times a week.   . Cobalamin Combinations (FOLTRATE) 500-1 MCG-MG TABS Take by mouth.  . cyanocobalamin 1000 MCG tablet Take 1,000 mcg by mouth 4 (four) times a week.   Marland Kitchen lisinopril (ZESTRIL) 20 MG tablet Take 1 Tablet By Mouth Daily.  . vitamin C (ASCORBIC ACID) 500 MG tablet Take 500 mg by mouth daily.  . vitamin E 400 UNIT capsule Take 400 Units by mouth 4 (four) times a week.   Marland Kitchen aspirin EC 81 MG tablet Take 81 mg by mouth daily.  . pantoprazole (PROTONIX) 40 MG tablet Take 1 tablet (40 mg total) by mouth daily. (Patient not taking: Reported on 10/31/2019)  . Probiotic CAPS Take 1 capsule by mouth daily.  . QUEtiapine (SEROQUEL) 25 MG tablet Take 1 tablet (25 mg total) by mouth at bedtime. (Patient not taking: Reported on 01/24/2020)  . saccharomyces boulardii (FLORASTOR) 250 MG capsule Take by mouth.   No  facility-administered medications prior to visit.    Review of Systems  Constitutional: Negative for appetite change, chills, fatigue and fever.  HENT: Negative.   Eyes: Negative.   Respiratory: Negative for chest tightness and shortness of breath.   Cardiovascular: Negative for chest pain and palpitations.  Gastrointestinal: Negative for abdominal pain, nausea and vomiting.  Endocrine: Negative.   Musculoskeletal: Negative.   Allergic/Immunologic: Negative.   Neurological: Negative for dizziness and weakness.  Hematological: Negative.   Psychiatric/Behavioral: Negative.        Objective    BP 123/60 (BP Location: Left Arm, Patient Position: Sitting, Cuff Size: Small)   Pulse (!) 56   Temp (!) 96 F (35.6 C) (Other (Comment))   Resp 18   Ht 4\' 11"  (1.499 m)   Wt 104 lb (47.2 kg)   SpO2 96%   BMI 21.01 kg/m  BP Readings from Last 3 Encounters:  03/22/20 123/60  02/22/20 (!) 157/66  02/15/20 (!) 167/57   Wt Readings from Last 3 Encounters:  03/22/20 104 lb (47.2 kg)  01/24/20 103 lb 3.2 oz (46.8 kg)  01/02/20 97 lb (44 kg)      Physical Exam Vitals reviewed.  Constitutional:      General: She is not in acute distress.    Appearance: She is not ill-appearing, toxic-appearing or diaphoretic.     Comments: Cachectic female in no acute distress.  HENT:  Head: Normocephalic and atraumatic.     Mouth/Throat:     Mouth: Mucous membranes are moist.     Pharynx: No oropharyngeal exudate.  Cardiovascular:     Rate and Rhythm: Normal rate and regular rhythm.  Pulmonary:     Effort: Pulmonary effort is normal. No respiratory distress.     Breath sounds: Normal breath sounds. No stridor. No wheezing, rhonchi or rales.  Chest:     Chest wall: No tenderness.  Abdominal:     Palpations: Abdomen is soft.  Musculoskeletal:     Cervical back: Normal range of motion.  Skin:    General: Skin is warm and dry.     Capillary Refill: Capillary refill takes less than 2  seconds.  Neurological:     Mental Status: She is alert and oriented to person, place, and time. Mental status is at baseline.  Psychiatric:        Mood and Affect: Mood normal.        Behavior: Behavior normal.       No results found for any visits on 03/22/20.  Assessment & Plan     1. Late onset Alzheimer's disease without behavioral disturbance [facility]) Patient is in no way competent to handle any matters.  She should not be handling any financial matters and the family is can have to make her decisions for her including medical decisions.  2. Hypertension, renovascular  - CBC w/Diff/Platelet - Comprehensive Metabolic Panel (CMET) - TSH  3. Weight loss This is stabilized. - CBC w/Diff/Platelet - Comprehensive Metabolic Panel (CMET) - TSH  4. Stage 3 chronic kidney disease, unspecified whether stage 3a or 3b CKD  - CBC w/Diff/Platelet - Comprehensive Metabolic Panel (CMET) - TSH  5. Malnutrition of moderate degree ([place]) Family is making sure she is fed. 6.Chronic knee pain Osteoarthritic pain.  Patient has pain with weightbearing.  Refer back to Dr. Brita Romp to consider steroid injection. I will see the patient back in a few months.  I certainly do not think she is a candidate for knee replacement with her moderate Alzheimer's No follow-ups on file.      I, Wilhemena Durie, MD, have reviewed all documentation for this visit. The documentation on 03/25/20 for the exam, diagnosis, procedures, and orders are all accurate and complete.    Richard Cranford Mon, MD  [facility] [phone] (phone) [phone] (fax)  [place]

## 2020-03-21 ENCOUNTER — Ambulatory Visit: Payer: Medicare Other

## 2020-03-21 ENCOUNTER — Other Ambulatory Visit: Payer: Self-pay

## 2020-03-21 DIAGNOSIS — Z9181 History of falling: Secondary | ICD-10-CM | POA: Diagnosis not present

## 2020-03-21 DIAGNOSIS — R2681 Unsteadiness on feet: Secondary | ICD-10-CM | POA: Diagnosis not present

## 2020-03-21 NOTE — Therapy (Signed)
[place] MAIN [facility] SERVICES [address] [place], [place], [address] Phone: [phone]   Fax:  [phone]  Physical Therapy Treatment  Patient Details  Name: Sarah [name] MRN: [id number] Date of Birth: September 24, 1941 Referring Provider (PT): Dr. Rosanna Randy   Encounter Date: 03/21/2020  PT End of Session - 03/21/20 1337    Visit Number  18    Number of Visits  37    Date for PT Re-Evaluation  04/11/20    Authorization Type  eval: 10/31/19    PT Start Time  0945    PT Stop Time  1015    PT Time Calculation (min)  30 min    Equipment Utilized During Treatment  Gait belt    Activity Tolerance  Patient tolerated treatment well    Behavior During Therapy  [facility] for tasks assessed/performed       Past Medical History:  Diagnosis Date  . Arthritis   . Dementia ([place])    ALZHEIMERS  . Ear anomaly    INNER EAR ISSUES  . Hyperlipidemia   . Hypertension     Past Surgical History:  Procedure Laterality Date  . APPENDECTOMY    . CATARACT EXTRACTION W/PHACO Right 03/09/2018   Procedure: CATARACT EXTRACTION PHACO AND INTRAOCULAR LENS PLACEMENT (IOC);  Surgeon: Birder Robson, MD;  Location: [facility] ORS;  Service: Ophthalmology;  Laterality: Right;  [place] 00:38.9 AP% 15.9 CDE 6.19 FLUID PACK LOT # [id number]  . CATARACT EXTRACTION W/PHACO Left 06/22/2018   Procedure: CATARACT EXTRACTION PHACO AND INTRAOCULAR LENS PLACEMENT (IOC);  Surgeon: Birder Robson, MD;  Location: [facility] ORS;  Service: Ophthalmology;  Laterality: Left;  [place] 00:50 AP% 16.5 CDE 8.25 Fluid pack lot # [id number] H  . CESAREAN SECTION    . CHOLECYSTECTOMY    . CYSTOSCOPY WITH STENT PLACEMENT      There were no vitals filed for this visit.  Subjective Assessment - 03/21/20 1334    Subjective  Pt and grandson report that she is doing alright today. Grandson reports that she has been more unsteady lately and notices that she is leaning forward and reaching for objects to assist her  balance when walking around the house. She has also been complaining of increased pain in both knees lately. She only reports "a little" pain in her knees upon arrival today. She has a follow-up appointment with her PCP tomorrow and grandson plans to ask if there is anything that can be done to help with her knee pain such as steroid injections.    Pertinent History  Sarah [name] is a 79 y.o. female with a history of severe Alzheimer's dementia, hypertension who was brought to the ED due to a fall at home. She hit the back of her head resulting in bleeding.  She was treated with staples to close her wound and had a follow-up appointment with her PCP who ordered PT due to fall. Per grandson patient's Alzheimer's has progressed significantly over the last few months. He states that she likes to walk but is unstable. She has had 2 falls in the last 6 months. Per grandson at baseline she is AOx1 and is able to recognize her son and grandson with whom she lives. She does not participate in any regular exercise.    Limitations  Walking    Patient Stated Goals  Improve balance and safety    Currently in Pain?  Yes    Pain Score  --   Unable to rate, face scale indicates  a little pain (around 2/10)   Pain Location  Knee    Pain Orientation  Right    Pain Descriptors / Indicators  Aching    Pain Type  Chronic pain    Pain Onset  More than a month ago    Pain Frequency  Intermittent          TREATMENT   Ther-ex Verbal/visual/tactile cues provided, PT also demonstrated/performed exercises with pt to facilitate: Sit to stand with BUE support from therapist x 10; Seated LAQ with manual resistance from therapist x 10 bilateral; Seated HS curls with manual resistance from therapist x 10 bilateral; Seated clams with manual resistance from therapist x 10 bilateral; Seated adductor isometric ball squeeze 2s hold x 10 bilateral; Seated ball kicks for LAQ activation x 2 minutes;   Neuromuscular  Re-education: CGA and handheld assist provided forexercises Basketball tosses with pt standing various distances from goal x 5 minutes; Standing ball kicks with grandson and therapist guarding x 2 minutes; Gait in hallway with lateral ball tosses to therapist x 60' each direction; Gait in hallway with ball bounces to self, therapist and grandson x 60'; Therapist danced with patientto tango musicin order to work on balance. Performedside stepping in both directions,forward/backward stepping, 180/360 degree turns, and cross-over stepsto both directions;   Pt educated throughout session about proper posture and technique with exercises. Improved exercise technique, movement at target joints, use of target muscles after min to mod verbal, visual, tactile cues.   Pt requires repeated redirection during session due to advanced dementia.Session started late so session is somewhat abbreviated today. She continues to demonstrate good motivation during sessions but requires creative activities for compliance/performance. She is very engaged today and doesn't appear quite as anxious about falling. Incorporated additional balance activities into session today such as walking with ball tosses and bouncing. She is also able to perform seated and standing ball kicks. Once again utilized dancing for balance trainingtodayat end of session.Pt will benefit from PT services to address deficits in strength, balance, and mobility in order to return to full function at home.                                         PT Short Term Goals - 01/18/20 1026      PT SHORT TERM GOAL #1   Title  Pt will be participate with HEP at the direction of her caregiver in order to improve strength and balance in order to decrease fall risk and improve function at home.    Time  6    Period  Weeks    Status  Partially Met    Target Date  02/29/20        PT Long Term Goals -  01/18/20 1027      PT LONG TERM GOAL #1   Title  Pt will improve BERG by to at least 40/56 in order to demonstrate clinically significant improvement in balance.    Baseline  10/31/19: 30/56; 01/18/20: 36/56    Time  12    Period  Weeks    Status  Partially Met    Target Date  04/11/20      PT LONG TERM GOAL #2   Title  Pt will decrease TUG to below 14 seconds/decrease in order to demonstrate decreased fall risk.    Baseline  10/31/19: 44.3s; 01/18/20: 32.8s    Time  12    Period  Weeks    Status  Partially Met    Target Date  04/11/20      PT LONG TERM GOAL #3   Title  Pt will increase 10MWT to at least 0.50 m/s in order to demonstrate clinically significant improvement in community ambulation.    Baseline  10/31/19: Self-selected: 57.4s =  0.17 m/s; 01/18/20: Self-selected: 29.4s =  0.34 m/s;    Time  12    Period  Weeks    Status  Partially Met    Target Date  04/11/20      PT LONG TERM GOAL #4   Title  Pt will be able to perform sit to stand from a regular height chair without UE support in order to demonstrate improved leg strength and safety    Baseline  10/31/19: Heavy UE dependence with sit to stand transfers; 01/18/20: Able to stand without UE reliance however prefers to place hands on arm rests    Time  12    Period  Weeks    Status  Achieved            Plan - 03/21/20 1337    Clinical Impression Statement  Pt requires repeated redirection during session due to advanced dementia. Session started late so session is somewhat abbreviated today. She continues to demonstrate good motivation during sessions but requires creative activities for compliance/performance. She is very engaged today and doesn't appear quite as anxious about falling. Incorporated additional balance activities into session today such as walking with ball tosses and bouncing. She is also able to perform seated and standing ball kicks. Once again utilized dancing for balance training today at end of session. Pt  will benefit from PT services to address deficits in strength, balance, and mobility in order to return to full function at home.    Personal Factors and Comorbidities  Age;Comorbidity 3+    Comorbidities  Alzheimer's, anxiety/depression, arthritis    Examination-Activity Limitations  Bathing;Bed Mobility;Caring for Others;Carry;Dressing;Hygiene/Grooming;Lift;Locomotion Level;Squat;Stand;Transfers    Examination-Participation Restrictions  Community Activity;Driving;Interpersonal Relationship;Laundry;Medication Management;Meal Prep;Personal Finances;Shop    Stability/Clinical Decision Making  Unstable/Unpredictable    Rehab Potential  Fair    PT Frequency  1x / week    PT Duration  12 weeks    PT Treatment/Interventions  ADLs/Self Care Home Management;Aquatic Therapy;Biofeedback;Canalith Repostioning;Cryotherapy;Electrical Stimulation;Iontophoresis 75m/ml Dexamethasone;Moist Heat;Traction;Ultrasound;DME Instruction;Gait training;Stair training;Functional mobility training;Therapeutic activities;Therapeutic exercise;Balance training;Neuromuscular re-education;Cognitive remediation;Patient/family education;Manual techniques;Passive range of motion;Dry needling;Vestibular;Joint Manipulations    PT Next Visit Plan  Progress balance and strengthening    PT Home Exercise Plan  seated marches, seated clams, seated LAQ, feet together balance, slow marching, sit to stand with minimal UE dependendence    Consulted and Agree with Plan of Care  Family member/caregiver    Family Member Consulted  GAdolphus Birchwood      Patient will benefit from skilled therapeutic intervention in order to improve the following deficits and impairments:  Abnormal gait, Decreased balance, Decreased cognition, Decreased knowledge of precautions, Decreased safety awareness, Decreased strength, Difficulty walking  Visit Diagnosis: Unsteadiness on feet  History of falling     Problem List Patient Active Problem List    Diagnosis Date Noted  . Malnutrition of moderate degree ([place] 01/25/2020  . Chronic kidney disease due to hypertension 07/24/2019  . Mesenteric artery stenosis ([place] 03/24/2019  . Renal artery stenosis ([place] 01/26/2017  . Personal history of tobacco use, presenting hazards to health 06/23/2016  . Depressive disorder 05/29/2015  . Dementia ([place]  05/29/2015  . Allergic rhinitis 03/05/2015  . Weak pulse 03/05/2015  . Smokes tobacco daily 03/05/2015  . Generalized anxiety disorder 03/05/2015  . Acid reflux 03/05/2015  . HLD (hyperlipidemia) 03/05/2015  . Affective disorder, major 03/05/2015  . Bad memory 03/05/2015  . Arthritis, degenerative 03/05/2015  . OP (osteoporosis) 03/05/2015  . Renovascular hypertension 03/05/2015  . Scoliosis 03/05/2015  . Avitaminosis D 03/05/2015  . Weight loss 11/07/2014  . Family history of colon cancer 11/07/2014   Phillips Grout PT, DPT, GCS  Antia Rahal 03/21/2020, 1:41 PM  [place] MAIN [facility] SERVICES [address] [place], [place], [address] Phone: [phone]   Fax:  [phone]  Name: Sarah [name] MRN: [id number] Date of Birth: [birth year]

## 2020-03-22 ENCOUNTER — Other Ambulatory Visit: Payer: Self-pay

## 2020-03-22 ENCOUNTER — Encounter: Payer: Self-pay | Admitting: Family Medicine

## 2020-03-22 ENCOUNTER — Ambulatory Visit (INDEPENDENT_AMBULATORY_CARE_PROVIDER_SITE_OTHER): Payer: Medicare Other | Admitting: Family Medicine

## 2020-03-22 VITALS — BP 123/60 | HR 56 | Temp 96.0°F | Resp 18 | Ht 59.0 in | Wt 104.0 lb

## 2020-03-22 DIAGNOSIS — N183 Chronic kidney disease, stage 3 unspecified: Secondary | ICD-10-CM

## 2020-03-22 DIAGNOSIS — R634 Abnormal weight loss: Secondary | ICD-10-CM

## 2020-03-22 DIAGNOSIS — G301 Alzheimer's disease with late onset: Secondary | ICD-10-CM

## 2020-03-22 DIAGNOSIS — G8929 Other chronic pain: Secondary | ICD-10-CM

## 2020-03-22 DIAGNOSIS — E440 Moderate protein-calorie malnutrition: Secondary | ICD-10-CM | POA: Diagnosis not present

## 2020-03-22 DIAGNOSIS — M25561 Pain in right knee: Secondary | ICD-10-CM

## 2020-03-22 DIAGNOSIS — I150 Renovascular hypertension: Secondary | ICD-10-CM

## 2020-03-22 DIAGNOSIS — M25562 Pain in left knee: Secondary | ICD-10-CM

## 2020-03-22 DIAGNOSIS — F0280 Dementia in other diseases classified elsewhere without behavioral disturbance: Secondary | ICD-10-CM

## 2020-03-23 LAB — CBC WITH DIFFERENTIAL/PLATELET
Basophils Absolute: 0.1 10*3/uL (ref 0.0–0.2)
Basos: 1 %
EOS (ABSOLUTE): 0.3 10*3/uL (ref 0.0–0.4)
Eos: 3 %
Hematocrit: 31.5 % — ABNORMAL LOW (ref 34.0–46.6)
Hemoglobin: 10.1 g/dL — ABNORMAL LOW (ref 11.1–15.9)
Immature Grans (Abs): 0 10*3/uL (ref 0.0–0.1)
Immature Granulocytes: 0 %
Lymphocytes Absolute: 1.7 10*3/uL (ref 0.7–3.1)
Lymphs: 20 %
MCH: 28.6 pg (ref 26.6–33.0)
MCHC: 32.1 g/dL (ref 31.5–35.7)
MCV: 89 fL (ref 79–97)
Monocytes Absolute: 0.5 10*3/uL (ref 0.1–0.9)
Monocytes: 6 %
Neutrophils Absolute: 5.6 10*3/uL (ref 1.4–7.0)
Neutrophils: 70 %
Platelets: 207 10*3/uL (ref 150–450)
RBC: 3.53 x10E6/uL — ABNORMAL LOW (ref 3.77–5.28)
RDW: 13.1 % (ref 11.7–15.4)
WBC: 8.1 10*3/uL (ref 3.4–10.8)

## 2020-03-23 LAB — COMPREHENSIVE METABOLIC PANEL
ALT: 7 IU/L (ref 0–32)
AST: 12 IU/L (ref 0–40)
Albumin/Globulin Ratio: 1.4 (ref 1.2–2.2)
Albumin: 4 g/dL (ref 3.7–4.7)
Alkaline Phosphatase: 57 IU/L (ref 48–121)
BUN/Creatinine Ratio: 34 — ABNORMAL HIGH (ref 12–28)
BUN: 66 mg/dL — ABNORMAL HIGH (ref 8–27)
Bilirubin Total: <0.2 mg/dL (ref 0.0–1.2)
CO2: 19 mmol/L — ABNORMAL LOW (ref 20–29)
Calcium: 9.3 mg/dL (ref 8.7–10.3)
Chloride: 104 mmol/L (ref 96–106)
Creatinine, Ser: 1.92 mg/dL — ABNORMAL HIGH (ref 0.57–1.00)
GFR calc Af Amer: 28 mL/min/{1.73_m2} — ABNORMAL LOW (ref >59)
GFR calc non Af Amer: 24 mL/min/{1.73_m2} — ABNORMAL LOW (ref >59)
Globulin, Total: 2.9 g/dL (ref 1.5–4.5)
Glucose: 130 mg/dL — ABNORMAL HIGH (ref 65–99)
Potassium: 4.6 mmol/L (ref 3.5–5.2)
Sodium: 137 mmol/L (ref 134–144)
Total Protein: 6.9 g/dL (ref 6.0–8.5)

## 2020-03-23 LAB — TSH: TSH: 0.845 u[IU]/mL (ref 0.450–4.500)

## 2020-03-27 ENCOUNTER — Telehealth: Payer: Self-pay

## 2020-03-27 ENCOUNTER — Other Ambulatory Visit (INDEPENDENT_AMBULATORY_CARE_PROVIDER_SITE_OTHER): Payer: Self-pay | Admitting: Nurse Practitioner

## 2020-03-27 DIAGNOSIS — I701 Atherosclerosis of renal artery: Secondary | ICD-10-CM

## 2020-03-27 DIAGNOSIS — K551 Chronic vascular disorders of intestine: Secondary | ICD-10-CM

## 2020-03-27 NOTE — Telephone Encounter (Signed)
-----   Message from Jerrol Banana., MD sent at 03/27/2020  8:09 AM EDT ----- Labs stable.  Patient mildly dehydrated so encourage water during the day.  This will also protect her kidneys a little.

## 2020-03-27 NOTE — Telephone Encounter (Signed)
Called to advise of patients lab results below, LVMTCB. If patient or grandson [name] calls back it is okay for someone else to advise.

## 2020-03-28 ENCOUNTER — Other Ambulatory Visit: Payer: Self-pay

## 2020-03-28 ENCOUNTER — Ambulatory Visit: Payer: Medicare Other | Attending: Family Medicine

## 2020-03-28 DIAGNOSIS — R2681 Unsteadiness on feet: Secondary | ICD-10-CM | POA: Insufficient documentation

## 2020-03-28 DIAGNOSIS — Z9181 History of falling: Secondary | ICD-10-CM | POA: Diagnosis not present

## 2020-03-28 NOTE — Therapy (Signed)
[place] MAIN [facility] SERVICES [address] [place], [place], [address] Phone: [phone]   Fax:  [phone]  Physical Therapy Treatment  Patient Details  Name: Sarah [name] MRN: [id number] Date of Birth: Oct 29, 1940 Referring Provider (PT): Dr. Rosanna Randy   Encounter Date: 03/28/2020  PT End of Session - 03/28/20 0950    Visit Number  19    Number of Visits  37    Date for PT Re-Evaluation  04/11/20    Authorization Type  eval: 10/31/19    PT Start Time  0945    PT Stop Time  1015    PT Time Calculation (min)  30 min    Equipment Utilized During Treatment  Gait belt    Activity Tolerance  Patient tolerated treatment well    Behavior During Therapy  [facility] for tasks assessed/performed       Past Medical History:  Diagnosis Date  . Arthritis   . Dementia ([place])    ALZHEIMERS  . Ear anomaly    INNER EAR ISSUES  . Hyperlipidemia   . Hypertension     Past Surgical History:  Procedure Laterality Date  . APPENDECTOMY    . CATARACT EXTRACTION W/PHACO Right 03/09/2018   Procedure: CATARACT EXTRACTION PHACO AND INTRAOCULAR LENS PLACEMENT (IOC);  Surgeon: Birder Robson, MD;  Location: [facility] ORS;  Service: Ophthalmology;  Laterality: Right;  [place] 00:38.9 AP% 15.9 CDE 6.19 FLUID PACK LOT # [id number] H  . CATARACT EXTRACTION W/PHACO Left 06/22/2018   Procedure: CATARACT EXTRACTION PHACO AND INTRAOCULAR LENS PLACEMENT (IOC);  Surgeon: Birder Robson, MD;  Location: [facility] ORS;  Service: Ophthalmology;  Laterality: Left;  [place] 00:50 AP% 16.5 CDE 8.25 Fluid pack lot # [id number] H  . CESAREAN SECTION    . CHOLECYSTECTOMY    . CYSTOSCOPY WITH STENT PLACEMENT      There were no vitals filed for this visit.  Subjective Assessment - 03/28/20 0948    Subjective  Pt and grandson report that she is doing alright today. She was more steady over the last week compared to the previous week. She saw Dr. Rosanna Randy and her weight is the heaviest it has been in a long  time. He is referring her for steroid injections of her knees. No falls since last therapy session. No signs of pain upon arrival. No specific questions or concerns.    Pertinent History  Sarah [name] is a 79 y.o. female with a history of severe Alzheimer's dementia, hypertension who was brought to the ED due to a fall at home. She hit the back of her head resulting in bleeding.  She was treated with staples to close her wound and had a follow-up appointment with her PCP who ordered PT due to fall. Per grandson patient's Alzheimer's has progressed significantly over the last few months. He states that she likes to walk but is unstable. She has had 2 falls in the last 6 months. Per grandson at baseline she is AOx1 and is able to recognize her son and grandson with whom she lives. She does not participate in any regular exercise.    Limitations  Walking    Patient Stated Goals  Improve balance and safety    Currently in Pain?  No/denies    Pain Onset  More than a month ago         TREATMENT   Ther-ex Verbal/visual/tactile cues provided, PT also demonstrated/performed exercises with pt to facilitate: NuStep L2 x 5 minutes for warm-up; BLE leg  press 30# x 10, 40# x 20; Sit to stand with BUE support from therapist x 10; Standing marches with BUE handheld support x 15 BLE; Standing mini squats with BUE handheld support x 10; Seated LAQ with manual resistance from therapist x 10 bilateral;   Neuromuscular Re-education: CGA and handheld assist provided forexercises Standing ball kicks with grandson and therapist guarding x 2 minutes; Balloon passes with intermittent single UE assist for balance but mostly no UE support x 2 minutes; 2kg ball toss with grandson to challenge dynamic balance, therapist instructed grandson to gradually increase distance to force pt to apply more force, encouraged stepping pattern with patient during toss to encourage trunk/pelvic rotation; Therapist danced with  patientto tango musicin order to work on balance. Performedside stepping in both directions,forward/backward stepping, 180/360 degree turns, and cross-over stepsto both directions;   Pt educated throughout session about proper posture and technique with exercises. Improved exercise technique, movement at target joints, use of target muscles after min to mod verbal, visual, tactile cues.   Pt requires repeated redirection during session due to advanced dementia.Session started late so session is somewhat abbreviated today. She continues to demonstrate good motivation during sessions but requires creative activities for compliance/performance.She is very engaged today and demonstrates significant improvement in her ability to perform the leg press at higher repetitions and with more weight. Incorporated additional balance activities into session today such as weighted ball toss. She is also able to perform seated and standing exercises as well however does report slight increase in knee pain..Once again utilized dancing for balance trainingtodayat end of session however pt does appear slightly more anxious today with respect to imbalance.Pt will benefit from PT services to address deficits in strength, balance, and mobility in order to return to full function at home.                          PT Short Term Goals - 01/18/20 1026      PT SHORT TERM GOAL #1   Title  Pt will be participate with HEP at the direction of her caregiver in order to improve strength and balance in order to decrease fall risk and improve function at home.    Time  6    Period  Weeks    Status  Partially Met    Target Date  02/29/20        PT Long Term Goals - 01/18/20 1027      PT LONG TERM GOAL #1   Title  Pt will improve BERG by to at least 40/56 in order to demonstrate clinically significant improvement in balance.    Baseline  10/31/19: 30/56; 01/18/20: 36/56    Time  12     Period  Weeks    Status  Partially Met    Target Date  04/11/20      PT LONG TERM GOAL #2   Title  Pt will decrease TUG to below 14 seconds/decrease in order to demonstrate decreased fall risk.    Baseline  10/31/19: 44.3s; 01/18/20: 32.8s    Time  12    Period  Weeks    Status  Partially Met    Target Date  04/11/20      PT LONG TERM GOAL #3   Title  Pt will increase 10MWT to at least 0.50 m/s in order to demonstrate clinically significant improvement in community ambulation.    Baseline  10/31/19: Self-selected: 57.4s =  0.17 m/s;  01/18/20: Self-selected: 29.4s =  0.34 m/s;    Time  12    Period  Weeks    Status  Partially Met    Target Date  04/11/20      PT LONG TERM GOAL #4   Title  Pt will be able to perform sit to stand from a regular height chair without UE support in order to demonstrate improved leg strength and safety    Baseline  10/31/19: Heavy UE dependence with sit to stand transfers; 01/18/20: Able to stand without UE reliance however prefers to place hands on arm rests    Time  12    Period  Weeks    Status  Achieved            Plan - 03/28/20 0950    Clinical Impression Statement  Pt requires repeated redirection during session due to advanced dementia. Session started late so session is somewhat abbreviated today. She continues to demonstrate good motivation during sessions but requires creative activities for compliance/performance. She is very engaged today and demonstrates significant improvement in her ability to perform the leg press at higher repetitions and with more weight. Incorporated additional balance activities into session today such as weighted ball toss. She is also able to perform seated and standing exercises as well however does report slight increase in knee pain.. Once again utilized dancing for balance training today at end of session however pt does appear slightly more anxious today with respect to imbalance. Pt will benefit from PT services to  address deficits in strength, balance, and mobility in order to return to full function at home.    Personal Factors and Comorbidities  Age;Comorbidity 3+    Comorbidities  Alzheimer's, anxiety/depression, arthritis    Examination-Activity Limitations  Bathing;Bed Mobility;Caring for Others;Carry;Dressing;Hygiene/Grooming;Lift;Locomotion Level;Squat;Stand;Transfers    Examination-Participation Restrictions  Community Activity;Driving;Interpersonal Relationship;Laundry;Medication Management;Meal Prep;Personal Finances;Shop    Stability/Clinical Decision Making  Unstable/Unpredictable    Rehab Potential  Fair    PT Frequency  1x / week    PT Duration  12 weeks    PT Treatment/Interventions  ADLs/Self Care Home Management;Aquatic Therapy;Biofeedback;Canalith Repostioning;Cryotherapy;Electrical Stimulation;Iontophoresis 80m/ml Dexamethasone;Moist Heat;Traction;Ultrasound;DME Instruction;Gait training;Stair training;Functional mobility training;Therapeutic activities;Therapeutic exercise;Balance training;Neuromuscular re-education;Cognitive remediation;Patient/family education;Manual techniques;Passive range of motion;Dry needling;Vestibular;Joint Manipulations    PT Next Visit Plan  Progress balance and strengthening    PT Home Exercise Plan  seated marches, seated clams, seated LAQ, feet together balance, slow marching, sit to stand with minimal UE dependendence    Consulted and Agree with Plan of Care  Family member/caregiver    Family Member Consulted  GAdolphus Birchwood      Patient will benefit from skilled therapeutic intervention in order to improve the following deficits and impairments:  Abnormal gait, Decreased balance, Decreased cognition, Decreased knowledge of precautions, Decreased safety awareness, Decreased strength, Difficulty walking  Visit Diagnosis: Unsteadiness on feet  History of falling     Problem List Patient Active Problem List   Diagnosis Date Noted  . Malnutrition  of moderate degree ([place] 01/25/2020  . Chronic kidney disease due to hypertension 07/24/2019  . Mesenteric artery stenosis ([place] 03/24/2019  . Renal artery stenosis ([place] 01/26/2017  . Personal history of tobacco use, presenting hazards to health 06/23/2016  . Depressive disorder 05/29/2015  . Dementia ([place] 05/29/2015  . Allergic rhinitis 03/05/2015  . Weak pulse 03/05/2015  . Smokes tobacco daily 03/05/2015  . Generalized anxiety disorder 03/05/2015  . Acid reflux 03/05/2015  . HLD (hyperlipidemia) 03/05/2015  . Affective disorder,  major 03/05/2015  . Bad memory 03/05/2015  . Arthritis, degenerative 03/05/2015  . OP (osteoporosis) 03/05/2015  . Renovascular hypertension 03/05/2015  . Scoliosis 03/05/2015  . Avitaminosis D 03/05/2015  . Weight loss 11/07/2014  . Family history of colon cancer 11/07/2014   Phillips Grout PT, DPT, GCS  Kalsey Lull 03/28/2020, 10:37 AM  [place] MAIN [facility] SERVICES [address] [place], [place], [address] Phone: [phone]   Fax:  [phone]  Name: Sarah [name] MRN: [id number] Date of Birth: 1941-01-07

## 2020-03-29 ENCOUNTER — Ambulatory Visit (INDEPENDENT_AMBULATORY_CARE_PROVIDER_SITE_OTHER): Payer: Medicare Other | Admitting: Vascular Surgery

## 2020-03-29 ENCOUNTER — Encounter (INDEPENDENT_AMBULATORY_CARE_PROVIDER_SITE_OTHER): Payer: Medicare Other

## 2020-03-30 NOTE — Telephone Encounter (Signed)
Result Notes and Comments to Patient Comment seen by patient Sarah [name] on 03/28/2020 9:46 AM EDT

## 2020-04-04 ENCOUNTER — Ambulatory Visit: Payer: Medicare Other

## 2020-04-04 ENCOUNTER — Other Ambulatory Visit: Payer: Self-pay

## 2020-04-04 DIAGNOSIS — Z9181 History of falling: Secondary | ICD-10-CM

## 2020-04-04 DIAGNOSIS — R2681 Unsteadiness on feet: Secondary | ICD-10-CM

## 2020-04-04 NOTE — Therapy (Signed)
[place] MAIN [facility] SERVICES [address] [place], [place], [address] Phone: [phone]   Fax:  [phone]  Physical Therapy Progress Note/Recertification/Treatment    Dates of reporting period  01/18/20   to   04/04/20        Patient Details  Name: Sarah [name] MRN: [id number] Date of Birth: 09-22-1941 Referring Provider (PT): Dr. Rosanna Randy   Encounter Date: 04/04/2020  PT End of Session - 04/04/20 1016    Visit Number  20    Number of Visits  49    Date for PT Re-Evaluation  06/27/20    Authorization Type  eval: 10/31/19, PN: 06/27/20    PT Start Time  1018    PT Stop Time  1100    PT Time Calculation (min)  42 min    Equipment Utilized During Treatment  Gait belt    Activity Tolerance  Patient tolerated treatment well    Behavior During Therapy  [facility] for tasks assessed/performed       Past Medical History:  Diagnosis Date  . Arthritis   . Dementia ([place])    ALZHEIMERS  . Ear anomaly    INNER EAR ISSUES  . Hyperlipidemia   . Hypertension     Past Surgical History:  Procedure Laterality Date  . APPENDECTOMY    . CATARACT EXTRACTION W/PHACO Right 03/09/2018   Procedure: CATARACT EXTRACTION PHACO AND INTRAOCULAR LENS PLACEMENT (IOC);  Surgeon: Birder Robson, MD;  Location: [facility] ORS;  Service: Ophthalmology;  Laterality: Right;  [place] 00:38.9 AP% 15.9 CDE 6.19 FLUID PACK LOT # [id number]  . CATARACT EXTRACTION W/PHACO Left 06/22/2018   Procedure: CATARACT EXTRACTION PHACO AND INTRAOCULAR LENS PLACEMENT (IOC);  Surgeon: Birder Robson, MD;  Location: [facility] ORS;  Service: Ophthalmology;  Laterality: Left;  [place] 00:50 AP% 16.5 CDE 8.25 Fluid pack lot # [id number]  . CESAREAN SECTION    . CHOLECYSTECTOMY    . CYSTOSCOPY WITH STENT PLACEMENT      There were no vitals filed for this visit.  Subjective Assessment - 04/04/20 1016    Subjective  Pt and grandson report that she is doing alright today. She has had a good week and no  stumbles or falls. She will be getting steroid injections in her knees this week. No signs of pain upon arrival. No specific questions or concerns.    Pertinent History  Sarah [name] is a 79 y.o. female with a history of severe Alzheimer's dementia, hypertension who was brought to the ED due to a fall at home. She hit the back of her head resulting in bleeding.  She was treated with staples to close her wound and had a follow-up appointment with her PCP who ordered PT due to fall. Per grandson patient's Alzheimer's has progressed significantly over the last few months. He states that she likes to walk but is unstable. She has had 2 falls in the last 6 months. Per grandson at baseline she is AOx1 and is able to recognize her son and grandson with whom she lives. She does not participate in any regular exercise.    Limitations  Walking    Patient Stated Goals  Improve balance and safety    Currently in Pain?  No/denies         TREATMENT   Neuromuscular Re-education    Updated outcome measures and goals with patient (see below)    01/18/20 04/04/20 Comments  BERG 36/56 (30/56 initial evaluation) 43/56 Fall risk, in need of  intervention  TUG 32.8s (44.3seconds initial evaluation) 32.1s Fall risk, in need of intervention  10 Meter Gait Speed Self-selected:29.4s =0.17ms; (Self-selected:57.4s =0.139m initial evaluation) Self-selected:25.6s =0.39 m/s;  Below normative values for full community ambulation, high fall risk    Agility ladder stepping encouraging pt to take large steps with one foot in each square, CGA/minA+1 with L hand held assist x 4 laps; Therapist danced with patientto tango musicin order to work on balance. Performedside stepping in both directions,forward/backward stepping, 180/360 degree turns, and cross-over stepsto both directions;   Ther-ex Verbal/visual/tactile cues provided, PT also demonstrated/performed exercises with pt to facilitate: Sit to  stand with BUE support from therapist x 10; Seated LAQ withmanual resistance from therapist x 10 BLE; Seated clams with manual resistance from therapist x 10 BLE; Seated adductor squeeze with manual resistance from therapist x 10 BLE;   Pt educated throughout session about proper posture and technique with exercises. Improved exercise technique, movement at target joints, use of target muscles after min to mod verbal, visual, tactile cues.   Updated outcome measure sand goals with patient today. She demonstrates improvements since first starting therapy and since goals were last updated on 01/18/20. Her BERG improved from 30/56 at initial evaluation to 36/56 on 01/18/20 and was 43/56 today. Her TUG dropped from 44.3s to 32.8s to 32.1s today and her self selected gait speed improved from 0.17 m/s to 0.34 m/s to 0.39 m/s today. She continues to require repeated redirection during session due to advanced dementia.She demonstrates good motivation during sessions. Grandson reports that he has noticed improvement at home with her balance and gait speed and pt has been gaining weight.She will continue to benefit from PT services to address deficits in strength, balance, and mobility in order to return to full function at home and decrease her risk for falls.                          PT Short Term Goals - 04/04/20 1023      PT SHORT TERM GOAL #1   Title  Pt will be participate with HEP at the direction of her caregiver in order to improve strength and balance in order to decrease fall risk and improve function at home.    Baseline  04/04/20: Pt resistant to HEP due to dementia    Time  6    Period  Weeks    Status  Partially Met    Target Date  05/16/20        PT Long Term Goals - 04/04/20 1023      PT LONG TERM GOAL #1   Title  Pt will improve BERG by to at least 40/56 in order to demonstrate clinically significant improvement in balance.    Baseline  10/31/19: 30/56;  01/18/20: 36/56; 04/04/20: 43/56    Time  12    Period  Weeks    Status  Achieved    Target Date  --      PT LONG TERM GOAL #2   Title  Pt will decrease TUG to below 14 seconds/decrease in order to demonstrate decreased fall risk.    Baseline  10/31/19: 44.3s; 01/18/20: 32.8s; 04/04/20: 32.1s    Time  12    Period  Weeks    Status  Partially Met    Target Date  06/27/20      PT LONG TERM GOAL #3   Title  Pt will increase 10MWT to at least 0.50  m/s in order to demonstrate clinically significant improvement in community ambulation.    Baseline  10/31/19: Self-selected: 57.4s =  0.17 m/s; 01/18/20: Self-selected: 29.4s =  0.34 m/s; 04/04/20: Self-selected: 25.6s =  0.39 m/s;    Time  12    Period  Weeks    Status  Partially Met    Target Date  06/27/20      PT LONG TERM GOAL #4   Title  Pt will be able to perform sit to stand from a regular height chair without UE support in order to demonstrate improved leg strength and safety    Baseline  10/31/19: Heavy UE dependence with sit to stand transfers; 01/18/20: Able to stand without UE reliance however prefers to place hands on arm rests; 04/04/20: Unchanged    Time  12    Period  Weeks    Status  Achieved            Plan - 04/04/20 1017    Clinical Impression Statement  Updated outcome measure sand goals with patient today. She demonstrates improvements since first starting therapy and since goals were last updated on 01/18/20. Her BERG improved from 30/56 at initial evaluation to 36/56 on 01/18/20 and was 43/56 today. Her TUG dropped from 44.3s to 32.8s to 32.1s today and her self selected gait speed improved from 0.17 m/s to 0.34 m/s to 0.39 m/s today. She continues to require repeated redirection during session due to advanced dementia. She demonstrates good motivation during sessions. Grandson reports that he has noticed improvement at home with her balance and gait speed and pt has been gaining weight. She will continue to benefit from PT services  to address deficits in strength, balance, and mobility in order to return to full function at home and decrease her risk for falls.    Personal Factors and Comorbidities  Age;Comorbidity 3+    Comorbidities  Alzheimer's, anxiety/depression, arthritis    Examination-Activity Limitations  Bathing;Bed Mobility;Caring for Others;Carry;Dressing;Hygiene/Grooming;Lift;Locomotion Level;Squat;Stand;Transfers    Examination-Participation Restrictions  Community Activity;Driving;Interpersonal Relationship;Laundry;Medication Management;Meal Prep;Personal Finances;Shop    Stability/Clinical Decision Making  Unstable/Unpredictable    Rehab Potential  Fair    PT Frequency  1x / week    PT Duration  12 weeks    PT Treatment/Interventions  ADLs/Self Care Home Management;Aquatic Therapy;Biofeedback;Canalith Repostioning;Cryotherapy;Electrical Stimulation;Iontophoresis 52m/ml Dexamethasone;Moist Heat;Traction;Ultrasound;DME Instruction;Gait training;Stair training;Functional mobility training;Therapeutic activities;Therapeutic exercise;Balance training;Neuromuscular re-education;Cognitive remediation;Patient/family education;Manual techniques;Passive range of motion;Dry needling;Vestibular;Joint Manipulations    PT Next Visit Plan  Progress balance and strengthening    PT Home Exercise Plan  seated marches, seated clams, seated LAQ, feet together balance, slow marching, sit to stand with minimal UE dependendence    Consulted and Agree with Plan of Care  Family member/caregiver    Family Member Consulted  GAdolphus Birchwood      Patient will benefit from skilled therapeutic intervention in order to improve the following deficits and impairments:  Abnormal gait, Decreased balance, Decreased cognition, Decreased knowledge of precautions, Decreased safety awareness, Decreased strength, Difficulty walking  Visit Diagnosis: Unsteadiness on feet  History of falling     Problem List Patient Active Problem List    Diagnosis Date Noted  . Malnutrition of moderate degree ([place] 01/25/2020  . Chronic kidney disease due to hypertension 07/24/2019  . Mesenteric artery stenosis ([place] 03/24/2019  . Renal artery stenosis ([place] 01/26/2017  . Personal history of tobacco use, presenting hazards to health 06/23/2016  . Depressive disorder 05/29/2015  . Dementia ([place] 05/29/2015  . Allergic rhinitis  03/05/2015  . Weak pulse 03/05/2015  . Smokes tobacco daily 03/05/2015  . Generalized anxiety disorder 03/05/2015  . Acid reflux 03/05/2015  . HLD (hyperlipidemia) 03/05/2015  . Affective disorder, major 03/05/2015  . Bad memory 03/05/2015  . Arthritis, degenerative 03/05/2015  . OP (osteoporosis) 03/05/2015  . Renovascular hypertension 03/05/2015  . Scoliosis 03/05/2015  . Avitaminosis D 03/05/2015  . Weight loss 11/07/2014  . Family history of colon cancer 11/07/2014   Phillips Grout PT, DPT, GCS  Geralynn Capri 04/04/2020, 12:57 PM  [place] MAIN [facility] SERVICES [address] [place], [place], [address] Phone: [phone]   Fax:  [phone]  Name: HENESSY ROHRER MRN: [id number] Date of Birth: 1941-08-29

## 2020-04-05 ENCOUNTER — Encounter: Payer: Self-pay | Admitting: Family Medicine

## 2020-04-05 ENCOUNTER — Ambulatory Visit (INDEPENDENT_AMBULATORY_CARE_PROVIDER_SITE_OTHER): Payer: Medicare Other | Admitting: Family Medicine

## 2020-04-05 ENCOUNTER — Other Ambulatory Visit: Payer: Self-pay

## 2020-04-05 VITALS — BP 151/78 | HR 58 | Temp 96.8°F | Resp 16 | Wt 100.0 lb

## 2020-04-05 DIAGNOSIS — M25462 Effusion, left knee: Secondary | ICD-10-CM | POA: Diagnosis not present

## 2020-04-05 DIAGNOSIS — M170 Bilateral primary osteoarthritis of knee: Secondary | ICD-10-CM | POA: Diagnosis not present

## 2020-04-05 MED ORDER — LIDOCAINE HCL (PF) 1 % IJ SOLN: 2.0000 mL | Freq: Once | INTRAMUSCULAR | Status: DC

## 2020-04-05 MED ORDER — METHYLPREDNISOLONE ACETATE 40 MG/ML IJ SUSP: 40.0000 mg | Freq: Once | INTRAMUSCULAR | Status: DC

## 2020-04-05 NOTE — Progress Notes (Signed)
Established patient visit   Patient: Sarah [name]   DOB: June 09, 1941   79 y.o. Female  MRN: [id number] Visit Date: 04/05/2020  I,Sulibeya S Dimas,acting as a scribe for Lavon Paganini, MD.,have documented all relevant documentation on the behalf of Lavon Paganini, MD,as directed by  Lavon Paganini, MD while in the presence of Lavon Paganini, MD.  Today's healthcare provider: Lavon Paganini, MD   Chief Complaint  Patient presents with  . Knee Pain   Subjective    HPI Patient here today C/O persistent knee pain worse in left than right. Patient here today for possible steroid injection.  Never had steroid injections.  Uses tylenol/NSAIDs prn  Pain affects lifestyle and comfort  Patient Active Problem List   Diagnosis Date Noted  . Malnutrition of moderate degree ([place]) 01/25/2020  . Chronic kidney disease due to hypertension 07/24/2019  . Mesenteric artery stenosis ([place]) 03/24/2019  . Renal artery stenosis ([place]) 01/26/2017  . Personal history of tobacco use, presenting hazards to health 06/23/2016  . Depressive disorder 05/29/2015  . Dementia ([place]) 05/29/2015  . Allergic rhinitis 03/05/2015  . Weak pulse 03/05/2015  . Smokes tobacco daily 03/05/2015  . Generalized anxiety disorder 03/05/2015  . Acid reflux 03/05/2015  . HLD (hyperlipidemia) 03/05/2015  . Affective disorder, major 03/05/2015  . Bad memory 03/05/2015  . Arthritis, degenerative 03/05/2015  . OP (osteoporosis) 03/05/2015  . Renovascular hypertension 03/05/2015  . Scoliosis 03/05/2015  . Avitaminosis D 03/05/2015  . Weight loss 11/07/2014  . Family history of colon cancer 11/07/2014   Social History   Tobacco Use  . Smoking status: Former Smoker    Packs/day: 0.50    Years: 40.00    Pack years: 20.00    Types: Cigarettes  . Smokeless tobacco: Never Used  . Tobacco comment: less than  Vaping Use  . Vaping Use: Never used  Substance Use Topics  . Alcohol use: No     Alcohol/week: 0.0 standard drinks  . Drug use: No       Medications: Outpatient Medications Prior to Visit  Medication Sig  . acetaminophen (TYLENOL) 500 MG tablet Take 500 mg by mouth at bedtime.   Marland Kitchen CALCIUM CARBONATE-VITAMIN D PO Take 1 tablet by mouth 4 (four) times a week.   . cholecalciferol (VITAMIN D) 1000 units tablet Take 1,000 Units by mouth 4 (four) times a week.   . Cobalamin Combinations (FOLTRATE) 500-1 MCG-MG TABS Take by mouth.  . cyanocobalamin 1000 MCG tablet Take 1,000 mcg by mouth 4 (four) times a week.   Marland Kitchen lisinopril (ZESTRIL) 20 MG tablet Take 1 Tablet By Mouth Daily.  . Probiotic CAPS Take 1 capsule by mouth daily.  . vitamin C (ASCORBIC ACID) 500 MG tablet Take 500 mg by mouth daily.  . vitamin E 400 UNIT capsule Take 400 Units by mouth 4 (four) times a week.   . [DISCONTINUED] aspirin EC 81 MG tablet Take 81 mg by mouth daily.  . [DISCONTINUED] pantoprazole (PROTONIX) 40 MG tablet Take 1 tablet (40 mg total) by mouth daily. (Patient not taking: Reported on 10/31/2019)  . [DISCONTINUED] QUEtiapine (SEROQUEL) 25 MG tablet Take 1 tablet (25 mg total) by mouth at bedtime. (Patient not taking: Reported on 01/24/2020)  . [DISCONTINUED] saccharomyces boulardii (FLORASTOR) 250 MG capsule Take by mouth.   No facility-administered medications prior to visit.    Review of Systems  Constitutional: Negative.   Respiratory: Negative.   Cardiovascular: Negative.   Musculoskeletal: Positive for arthralgias and gait  problem.      Objective    BP (!) 151/78 (BP Location: Right Arm, Patient Position: Sitting, Cuff Size: Normal)   Pulse (!) 58   Temp (!) 96.8 F (36 C) (Temporal)   Resp 16   Wt 100 lb (45.4 kg)   BMI 20.20 kg/m    Physical Exam Vitals reviewed.  Constitutional:      General: She is not in acute distress.    Appearance: Normal appearance. She is not diaphoretic.  Cardiovascular:     Rate and Rhythm: Normal rate and regular rhythm.  Pulmonary:      Effort: Pulmonary effort is normal. No respiratory distress.     Breath sounds: No wheezing.  Musculoskeletal:     Right knee: Crepitus present. No swelling, effusion, erythema or bony tenderness. Normal range of motion. No LCL laxity, MCL laxity, ACL laxity or PCL laxity. Normal meniscus.     Left knee: Effusion and crepitus present. No erythema. Normal range of motion. Tenderness present over the lateral joint line. No LCL laxity, MCL laxity, ACL laxity or PCL laxity.Normal meniscus.     Right lower leg: No edema.     Left lower leg: No edema.  Skin:    Findings: No erythema or rash.  Neurological:     Mental Status: She is alert. Mental status is at baseline.  Psychiatric:        Behavior: Behavior normal.      No results found for any visits on 04/05/20.  Assessment & Plan     1. Primary osteoarthritis of both knees 2. Effusion of left knee - chronic problem - reviewed last XRay - since it is affecting her quality of life, reasonable to do joint injections today - unable to aspirate the effusion due to patient comfort - given advanced dementia, not a surgical candidate - can continue injections up to every 3 months as needed for pain control - discussed return precautions - lidocaine (PF) (XYLOCAINE) 1 % injection 2 mL - methylPREDNISolone acetate (DEPO-MEDROL) injection 40 mg - lidocaine (PF) (XYLOCAINE) 1 % injection 2 mL - methylPREDNISolone acetate (DEPO-MEDROL) injection 40 mg - lidocaine (PF) (XYLOCAINE) 1 % injection 2 mL - lidocaine (PF) (XYLOCAINE) 1 % injection 2 mL    INJECTION: Patient was given informed consent,. Appropriate time out was taken. Area prepped and draped in usual sterile fashion. 1 cc of depo-medrol 40 mg/ml plus  4 cc of 1% lidocaine without epinephrine was injected into each knee using a(n) anterolateral approach. The patient tolerated the procedure well. There were no complications. Post procedure instructions were given.    Return if  symptoms worsen or fail to improve.      I, Lavon Paganini, MD, have reviewed all documentation for this visit. The documentation on 04/05/20 for the exam, diagnosis, procedures, and orders are all accurate and complete.   Mikalyn Hermida, Dionne Bucy, MD, MPH [facility]

## 2020-04-05 NOTE — Patient Instructions (Signed)
Knee Injection A knee injection is a procedure to get medicine into your knee joint to relieve the pain, swelling, and stiffness of arthritis. Your health care provider uses a needle to inject medicine, which may also help to lubricate and cushion your knee joint. You may need more than one injection. Tell a health care provider about:  Any allergies you have.  All medicines you are taking, including vitamins, herbs, eye drops, creams, and over-the-counter medicines.  Any problems you or family members have had with anesthetic medicines.  Any blood disorders you have.  Any surgeries you have had.  Any medical conditions you have.  Whether you are pregnant or may be pregnant. What are the risks? Generally, this is a safe procedure. However, problems may occur, including:  Infection.  Bleeding.  Symptoms that get worse.  Damage to the area around your knee.  Allergic reaction to any of the medicines.  Skin reactions from repeated injections. What happens before the procedure?  Ask your health care provider about changing or stopping your regular medicines. This is especially important if you are taking diabetes medicines or blood thinners.  Plan to have someone take you home from the hospital or clinic. What happens during the procedure?   You will sit or lie down in a position for your knee to be treated.  The skin over your kneecap will be cleaned with a germ-killing soap.  You will be given a medicine that numbs the area (local anesthetic). You may feel some stinging.  The medicine will be injected into your knee. The needle is carefully placed between your kneecap and your knee. The medicine is injected into the joint space.  The needle will be removed at the end of the procedure.  A bandage (dressing) may be placed over the injection site. The procedure may vary among health care providers and hospitals. What can I expect after the procedure?  Your blood  pressure, heart rate, breathing rate, and blood oxygen level will be monitored until you leave the hospital or clinic.  You may have to move your knee through its full range of motion. This helps to get all the medicine into your joint space.  You will be watched to make sure that you do not have a reaction to the injected medicine.  You may feel more pain, swelling, and warmth than you did before the injection. This reaction may last about 1-2 days. Follow these instructions at home: Medicines  Take over-the-counter and prescription medicines only as told by your doctor.  Do not drive or use heavy machinery while taking prescription pain medicine.  Do not take medicines such as aspirin and ibuprofen unless your health care provider tells you to take them. Injection site care  Follow instructions from your health care provider about: ? How to take care of your puncture site. ? When and how you should change your dressing. ? When you should remove your dressing.  Check your injection area every day for signs of infection. Check for: ? More redness, swelling, or pain after 2 days. ? Fluid or blood. ? Pus or a bad smell. ? Warmth. Managing pain, stiffness, and swelling   If directed, put ice on the injection area: ? Put ice in a plastic bag. ? Place a towel between your skin and the bag. ? Leave the ice on for 20 minutes, 2-3 times per day.  Do not apply heat to your knee.  Raise (elevate) the injection area above the level  of your heart while you are sitting or lying down. General instructions  If you were given a dressing, keep it dry until your health care provider says it can be removed. Ask your health care provider when you can start showering or taking a bath.  Avoid strenuous activities for as long as directed by your health care provider. Ask your health care provider when you can return to your normal activities.  Keep all follow-up visits as told by your health  care provider. This is important. You may need more injections. Contact a health care provider if you have:  A fever.  Warmth in your injection area.  Fluid, blood, or pus coming from your injection site.  Symptoms at your injection site that last longer than 2 days after your procedure. Get help right away if:  Your knee: ? Turns very red. ? Becomes very swollen. ? Is in severe pain. Summary  A knee injection is a procedure to get medicine into your knee joint to relieve the pain, swelling, and stiffness of arthritis.  A needle is carefully placed between your kneecap and your knee to inject medicine into the joint space.  Before the procedure, ask your health care provider about changing or stopping your regular medicines, especially if you are taking diabetes medicines or blood thinners.  Contact your health care provider if you have any problems or questions after your procedure. This information is not intended to replace advice given to you by your health care provider. Make sure you discuss any questions you have with your health care provider. Document Revised: 11/02/2017 Document Reviewed: 11/02/2017 Elsevier Patient Education  [place].

## 2020-04-11 ENCOUNTER — Ambulatory Visit: Payer: Medicare Other

## 2020-04-11 ENCOUNTER — Other Ambulatory Visit: Payer: Self-pay

## 2020-04-11 DIAGNOSIS — Z9181 History of falling: Secondary | ICD-10-CM

## 2020-04-11 DIAGNOSIS — R2681 Unsteadiness on feet: Secondary | ICD-10-CM | POA: Diagnosis not present

## 2020-04-11 NOTE — Therapy (Signed)
[place] MAIN [facility] SERVICES [address] [place], [place], [address] Phone: [phone]   Fax:  [phone]  Physical Therapy Treatment  Patient Details  Name: Sarah Sarah [name] MRN: [id number] Date of Birth: 1940-12-14 Referring Provider (PT): Dr. Rosanna [name]   Encounter Date: Sarah [name]   PT End of Session - 04/11/20 1124    Visit Number 21    Number of Visits 49    Date for PT Re-Evaluation 06/27/20    Authorization Type eval: 10/31/19, PN: 06/27/20    PT Start Time 0940    PT Stop Time 1020    PT Time Calculation (min) 40 min    Equipment Utilized During Treatment Gait belt    Activity Tolerance Patient tolerated treatment well    Behavior During Therapy Sarah Sarah [name] for tasks assessed/performed           Past Medical History:  Diagnosis Date   Arthritis    Dementia (Sarah Sarah [name])    ALZHEIMERS   Ear anomaly    INNER EAR ISSUES   Hyperlipidemia    Hypertension     Past Surgical History:  Procedure Laterality Date   APPENDECTOMY     CATARACT EXTRACTION W/PHACO Right 03/09/2018   Procedure: CATARACT EXTRACTION PHACO AND INTRAOCULAR LENS PLACEMENT ([place]);  Surgeon: Birder Robson, MD;  Location: [facility] ORS;  Service: Ophthalmology;  Laterality: Right;  [place] 00:38.9 AP% 15.9 CDE 6.19 FLUID PACK LOT # [id number]   CATARACT EXTRACTION W/PHACO Left 06/22/2018   Procedure: CATARACT EXTRACTION PHACO AND INTRAOCULAR LENS PLACEMENT (I[facility]);  Surgeon: Birder Robson, MD;  Location: [facility] ORS;  Service: Ophthalmology;  Laterality: Left;  [place] 00:50 AP% 16.5 CDE 8.25 Fluid pack lot # [id number]   CESAREAN SECTION     CHOLECYSTECTOMY     CYSTOSCOPY WITH STENT PLACEMENT      There were no vitals filed for this visit.   Subjective Assessment - 04/11/20 0949    Subjective Pt and grandson report that she is doing alright today. She has had a good week and no stumbles or falls. She got steroid injections in both knees last without issue. [name] reports  possible slight improvement but unsure if it has been significantly helpful yet. No signs of pain upon arrival. No specific questions or concerns.    Pertinent History Sarah Sarah [name] is a 79 y.o. female with a history of severe Alzheimer's dementia, hypertension who was brought to the ED due to a fall at home. She hit the back of her head resulting in bleeding.  She was treated with staples to close her wound and had a follow-up appointment with her PCP who ordered PT due to fall. Per grandson patient's Alzheimer's has progressed significantly over the last few months. He states that she likes to walk but is unstable. She has had 2 falls in the last 6 months. Per grandson at baseline she is AOx1 and is able to recognize her son and grandson with whom she lives. She does not participate in any regular exercise.    Limitations Walking    Patient Stated Goals Improve balance and safety    Currently in Pain? No/denies                TREATMENT   Ther-ex Verbal/visual/tactile cues provided, PT also demonstrated/performed exercises with pt to facilitate: NuStep L2 x 5 minutes for warm-up; BLE leg press 40# x 20, 55# x 10; Sit to stand with BUE support from therapist x 10; Seated marches with manual resistance  from therapist x 10 BLE; Seated clams with manual resistance from therapist x 10 BLE; Seated adductor squeeze with manual resistance from therapist x 10 BLE;   Neuromuscular Re-education: CGA and handheld assist provided forexercises Walking soccer kicks with grandson and therapist guarding 65' x 2; Walking ball bounces with grandson and therapist guarding 28' x 2, pt encouraged to toss harder and farther; Standing weight ball (2kg) chest passes with grandson to challenge dynamic balance, therapist instructed grandson to gradually increase distance to force pt to apply more force.Cues to bend knees and explode up during toss; Therapist danced with patientto tango musicin order to  work on balance. Performedside stepping in both directions,forward/backward stepping, 180/360 degree turns, and cross-over stepsto both directions;   Pt educated throughout session about proper posture and technique with exercises. Improved exercise technique, movement at target joints, use of target muscles after min to mod verbal, visual, tactile cues.   Pt requires repeated redirection during session due to advanced dementia.She continues to demonstrate good motivation during sessions butrequirescreative activities for compliance/performance.Sheis very engaged today and demonstrates significant improvement in her ability to perform the leg press at higher repetitions and with more weight. She does requires a lot of verbal encouragement to complete however.Incorporated additional balance activities into session today such as weighted ball chest passes with grandson. She is also able to perform seated and standing exercises and does not complain of any increase in knee pain. Utilized dynamic activities for balance such as walking with ball kicks and bounces.Once again utilized dancing for balance trainingtodayat end of session however pt does appear slightly more anxious today with respect to imbalance.Pt will benefit from PT services to address deficits in strength, balance, and mobility in order to return to full function at home.                         PT Short Term Goals - 04/04/20 1023      PT SHORT TERM GOAL #1   Title Pt will be participate with HEP at the direction of her caregiver in order to improve strength and balance in order to decrease fall risk and improve function at home.    Baseline 04/04/20: Pt resistant to HEP due to dementia    Time 6    Period Weeks    Status Partially Met    Target Date 05/16/20             PT Long Term Goals - 04/04/20 1023      PT LONG TERM GOAL #1   Title Pt will improve BERG by to at least 40/56 in order  to demonstrate clinically significant improvement in balance.    Baseline 10/31/19: 30/56; 01/18/20: 36/56; 04/04/20: 43/56    Time 12    Period Weeks    Status Achieved    Target Date --      PT LONG TERM GOAL #2   Title Pt will decrease TUG to below 14 seconds/decrease in order to demonstrate decreased fall risk.    Baseline 10/31/19: 44.3s; 01/18/20: 32.8s; 04/04/20: 32.1s    Time 12    Period Weeks    Status Partially Met    Target Date 06/27/20      PT LONG TERM GOAL #3   Title Pt will increase 10MWT to at least 0.50 m/s in order to demonstrate clinically significant improvement in community ambulation.    Baseline 10/31/19: Self-selected: 57.4s =  0.17 m/s; 01/18/20: Self-selected: 29.4s =  0.34 m/s; 04/04/20: Self-selected: 25.6s =  0.39 m/s;    Time 12    Period Weeks    Status Partially Met    Target Date 06/27/20      PT LONG TERM GOAL #4   Title Pt will be able to perform sit to stand from a regular height chair without UE support in order to demonstrate improved leg strength and safety    Baseline 10/31/19: Heavy UE dependence with sit to stand transfers; 01/18/20: Able to stand without UE reliance however prefers to place hands on arm rests; 04/04/20: Unchanged    Time 12    Period Weeks    Status Achieved                 Plan - 04/11/20 1125    Clinical Impression Statement Pt requires repeated redirection during session due to advanced dementia. She continues to demonstrate good motivation during sessions but requires creative activities for compliance/performance. She is very engaged today and demonstrates significant improvement in her ability to perform the leg press at higher repetitions and with more weight. She does requires a lot of verbal encouragement to complete however. Incorporated additional balance activities into session today such as weighted ball chest passes with grandson. She is also able to perform seated and standing exercises and does not complain of any  increase in knee pain. Utilized dynamic activities for balance such as walking with ball kicks and bounces. Once again utilized dancing for balance training today at end of session however pt does appear slightly more anxious today with respect to imbalance. Pt will benefit from PT services to address deficits in strength, balance, and mobility in order to return to full function at home.    Personal Factors and Comorbidities Age;Comorbidity 3+    Comorbidities Alzheimer's, anxiety/depression, arthritis    Examination-Activity Limitations Bathing;Bed Mobility;Caring for Others;Carry;Dressing;Hygiene/Grooming;Lift;Locomotion Level;Squat;Stand;Transfers    Examination-Participation Restrictions Community Activity;Driving;Interpersonal Relationship;Laundry;Medication Management;Meal Prep;Personal Finances;Shop    Stability/Clinical Decision Making Unstable/Unpredictable    Rehab Potential Fair    PT Frequency 1x / week    PT Duration 12 weeks    PT Treatment/Interventions ADLs/Self Care Home Management;Aquatic Therapy;Biofeedback;Canalith Repostioning;Cryotherapy;Electrical Stimulation;Iontophoresis 78m/ml Dexamethasone;Moist Heat;Traction;Ultrasound;DME Instruction;Gait training;Stair training;Functional mobility training;Therapeutic activities;Therapeutic exercise;Balance training;Neuromuscular re-education;Cognitive remediation;Patient/family education;Manual techniques;Passive range of motion;Dry needling;Vestibular;Joint Manipulations    PT Next Visit Plan Progress balance and strengthening    PT Home Exercise Plan seated marches, seated clams, seated LAQ, feet together balance, slow marching, sit to stand with minimal UE dependendence    Consulted and Agree with Plan of Care Family member/caregiver    Family Member Consulted Sarah Sarah [name]          Patient will benefit from skilled therapeutic intervention in order to improve the following deficits and impairments:  Abnormal gait, Decreased  balance, Decreased cognition, Decreased knowledge of precautions, Decreased safety awareness, Decreased strength, Difficulty walking  Visit Diagnosis: Unsteadiness on feet  History of falling     Problem List Patient Active Problem List   Diagnosis Date Noted   Malnutrition of moderate degree ([facility] 01/25/2020   Chronic kidney disease due to hypertension 07/24/2019   Mesenteric artery stenosis ([place] 03/24/2019   Renal artery stenosis ([place] 01/26/2017   Personal history of tobacco use, presenting hazards to health 06/23/2016   Depressive disorder 05/29/2015   Dementia ([place] 05/29/2015   Allergic rhinitis 03/05/2015   Weak pulse 03/05/2015   Smokes tobacco daily 03/05/2015   Generalized anxiety disorder 03/05/2015   Acid reflux 03/05/2015  HLD (hyperlipidemia) 03/05/2015   Affective disorder, major 03/05/2015   Bad memory 03/05/2015   Arthritis, degenerative 03/05/2015   OP (osteoporosis) 03/05/2015   Renovascular hypertension 03/05/2015   Scoliosis 03/05/2015   Avitaminosis D 03/05/2015   Weight loss 11/07/2014   Family history of colon cancer 11/07/2014   Sarah Sarah [name] PT, DPT, GCS  Sarah Sarah [name], Sarah Sarah [name]  [place] MAIN [facility] SERVICES [address] [place], [place], [address] Phone: [phone]   Fax:  [phone]  Name: Sarah Sarah [name] MRN: [id number] Date of Birth: [birth year]

## 2020-04-18 ENCOUNTER — Other Ambulatory Visit: Payer: Self-pay

## 2020-04-18 ENCOUNTER — Ambulatory Visit: Payer: Medicare Other

## 2020-04-18 DIAGNOSIS — R2681 Unsteadiness on feet: Secondary | ICD-10-CM | POA: Diagnosis not present

## 2020-04-18 DIAGNOSIS — Z9181 History of falling: Secondary | ICD-10-CM

## 2020-04-18 NOTE — Therapy (Signed)
[place] MAIN [facility] SERVICES [address] [place], [place], [address] Phone: [phone]   Fax:  [phone]  Physical Therapy Treatment  Patient Details  Name: Sarah [name] MRN: [id number] Date of Birth: [birth year] Referring Provider (PT): Dr. Rosanna Randy   Encounter Date: 04/18/2020   PT End of Session - 04/18/20 1118    Visit Number 22    Number of Visits 49    Date for PT Re-Evaluation 06/27/20    Authorization Type eval: 10/31/19, PN: 06/27/20    PT Start Time 1108    PT Stop Time 1146    PT Time Calculation (min) 38 min    Equipment Utilized During Treatment Gait belt    Activity Tolerance Patient tolerated treatment well    Behavior During Therapy [facility] for tasks assessed/performed           Past Medical History:  Diagnosis Date  . Arthritis   . Dementia ([place])    ALZHEIMERS  . Ear anomaly    INNER EAR ISSUES  . Hyperlipidemia   . Hypertension     Past Surgical History:  Procedure Laterality Date  . APPENDECTOMY    . CATARACT EXTRACTION W/PHACO Right 03/09/2018   Procedure: CATARACT EXTRACTION PHACO AND INTRAOCULAR LENS PLACEMENT ([facility]);  Surgeon: Birder Robson, MD;  Location: [facility] ORS;  Service: Ophthalmology;  Laterality: Right;  [place] 00:38.9 AP% 15.9 CDE 6.19 FLUID PACK LOT # [id number] H  . CATARACT EXTRACTION W/PHACO Left 06/22/2018   Procedure: CATARACT EXTRACTION PHACO AND INTRAOCULAR LENS PLACEMENT ([facility]);  Surgeon: Birder Robson, MD;  Location: [facility] ORS;  Service: Ophthalmology;  Laterality: Left;  [place] 00:50 AP% 16.5 CDE 8.25 Fluid pack lot # [id number] H  . CESAREAN SECTION    . CHOLECYSTECTOMY    . CYSTOSCOPY WITH STENT PLACEMENT      There were no vitals filed for this visit.   Subjective Assessment - 04/18/20 1112    Subjective Pt/grandson report doing well today. Has been working on HEP some, otherwise has been doing more engaging activity in house.    Patient is accompained by: Family member    Pertinent  History Sarah [name] is a 79 y.o. female with a history of severe Alzheimer's dementia, hypertension who was brought to the ED due to a fall at home. She hit the back of her head resulting in bleeding.  She was treated with staples to close her wound and had a follow-up appointment with her PCP who ordered PT due to fall. Per grandson patient's Alzheimer's has progressed significantly over the last few months. He states that she likes to walk but is unstable. She has had 2 falls in the last 6 months. Per grandson at baseline she is AOx1 and is able to recognize her son and grandson with whom she lives. She does not participate in any regular exercise.    Limitations Walking    Patient Stated Goals Improve balance and safety    Currently in Pain? No/denies           INTERVENTION THIS DATE: -Multiplane AMB overground, intermittent UE support from author, 267f (pt struggles with cues for lateral stepping, slightly better with retro AMB)  -rainbow ball toss/catch 30x with variable degrees of rotation from grandson -cone pick up from floor x8, minguard assist, no LOB, good safety awareness -Seated LAQ 15x bilat -Marching 1x15 bilat -STS from elevated surface (standard increases Left knee pain) 2x10  -airex velcro dart game x 5 minutes, several LOB requiring assist  from author to prevent fall; difficult with righting strategy due to weakness -airex ball self/toss catch x20 (requires visual demonstration throughout and heavy verbal cues) difficult cognitively     PT Short Term Goals - 04/04/20 1023      PT SHORT TERM GOAL #1   Title Pt will be participate with HEP at the direction of her caregiver in order to improve strength and balance in order to decrease fall risk and improve function at home.    Baseline 04/04/20: Pt resistant to HEP due to dementia    Time 6    Period Weeks    Status Partially Met    Target Date 05/16/20             PT Long Term Goals - 04/04/20 1023      PT LONG  TERM GOAL #1   Title Pt will improve BERG by to at least 40/56 in order to demonstrate clinically significant improvement in balance.    Baseline 10/31/19: 30/56; 01/18/20: 36/56; 04/04/20: 43/56    Time 12    Period Weeks    Status Achieved    Target Date --      PT LONG TERM GOAL #2   Title Pt will decrease TUG to below 14 seconds/decrease in order to demonstrate decreased fall risk.    Baseline 10/31/19: 44.3s; 01/18/20: 32.8s; 04/04/20: 32.1s    Time 12    Period Weeks    Status Partially Met    Target Date 06/27/20      PT LONG TERM GOAL #3   Title Pt will increase 10MWT to at least 0.50 m/s in order to demonstrate clinically significant improvement in community ambulation.    Baseline 10/31/19: Self-selected: 57.4s =  0.17 m/s; 01/18/20: Self-selected: 29.4s =  0.34 m/s; 04/04/20: Self-selected: 25.6s =  0.39 m/s;    Time 12    Period Weeks    Status Partially Met    Target Date 06/27/20      PT LONG TERM GOAL #4   Title Pt will be able to perform sit to stand from a regular height chair without UE support in order to demonstrate improved leg strength and safety    Baseline 10/31/19: Heavy UE dependence with sit to stand transfers; 01/18/20: Able to stand without UE reliance however prefers to place hands on arm rests; 04/04/20: Unchanged    Time 12    Period Weeks    Status Achieved                 Plan - 04/18/20 1119    Clinical Impression Statement Continued with current plan of care, gently progressing patient's program aimed at address deficits and limitations identified in evlauation. Grandson assist with language interpretation occasionally as needed. Pt has difficulty with verbal cues at times, but does better with tactile cues and demonstration. Pt continues to show good safety awareness, attempts to remain safe, but struggles with righting strategies when postural sway increases on foam. Pt continues to make gradual steady progress toward treatment goals in general. Extensive  communicaiton to assure pt is able to perform all activities without exacerbation of pain or other symptoms.    Personal Factors and Comorbidities Age;Comorbidity 3+    Comorbidities Alzheimer's, anxiety/depression, arthritis    Examination-Activity Limitations Bathing;Bed Mobility;Caring for Others;Carry;Dressing;Hygiene/Grooming;Lift;Locomotion Level;Squat;Stand;Transfers    Examination-Participation Restrictions Community Activity;Driving;Interpersonal Relationship;Laundry;Medication Management;Meal Prep;Personal Finances;Shop    Stability/Clinical Decision Making Unstable/Unpredictable    Clinical Decision Making High    Rehab Potential Fair  PT Frequency 1x / week    PT Duration 12 weeks    PT Treatment/Interventions ADLs/Self Care Home Management;Aquatic Therapy;Biofeedback;Canalith Repostioning;Cryotherapy;Electrical Stimulation;Iontophoresis 40m/ml Dexamethasone;Moist Heat;Traction;Ultrasound;DME Instruction;Gait training;Stair training;Functional mobility training;Therapeutic activities;Therapeutic exercise;Balance training;Neuromuscular re-education;Cognitive remediation;Patient/family education;Manual techniques;Passive range of motion;Dry needling;Vestibular;Joint Manipulations    PT Next Visit Plan Progress balance and strengthening    PT Home Exercise Plan seated marches, seated clams, seated LAQ, feet together balance, slow marching, sit to stand with minimal UE dependendence    Consulted and Agree with Plan of Care Family member/caregiver    Family Member Consulted Sarah [name]          Patient will benefit from skilled therapeutic intervention in order to improve the following deficits and impairments:  Abnormal gait, Decreased balance, Decreased cognition, Decreased knowledge of precautions, Decreased safety awareness, Decreased strength, Difficulty walking  Visit Diagnosis: Unsteadiness on feet  History of falling     Problem List Patient Active Problem List    Diagnosis Date Noted  . Malnutrition of moderate degree ([place] 01/25/2020  . Chronic kidney disease due to hypertension 07/24/2019  . Mesenteric artery stenosis ([place] 03/24/2019  . Renal artery stenosis ([place] 01/26/2017  . Personal history of tobacco use, presenting hazards to health 06/23/2016  . Depressive disorder 05/29/2015  . Dementia ([place] 05/29/2015  . Allergic rhinitis 03/05/2015  . Weak pulse 03/05/2015  . Smokes tobacco daily 03/05/2015  . Generalized anxiety disorder 03/05/2015  . Acid reflux 03/05/2015  . HLD (hyperlipidemia) 03/05/2015  . Affective disorder, major 03/05/2015  . Bad memory 03/05/2015  . Arthritis, degenerative 03/05/2015  . OP (osteoporosis) 03/05/2015  . Renovascular hypertension 03/05/2015  . Scoliosis 03/05/2015  . Avitaminosis D 03/05/2015  . Weight loss 11/07/2014  . Family history of colon cancer 11/07/2014   12:10 PM, 04/18/20 Sarah [name] PT, DPT Physical Therapist - [facility]- [phone]    BEtta Grandchild6/23/2021, 11:39 AM  [place] [address] [address]Phone: [phone]  Fax:  [phone] Name: Sarah BENNINGMRN: [id number] of Birth: 704-11-1940

## 2020-04-23 ENCOUNTER — Other Ambulatory Visit (INDEPENDENT_AMBULATORY_CARE_PROVIDER_SITE_OTHER): Payer: Self-pay | Admitting: Nurse Practitioner

## 2020-04-23 DIAGNOSIS — K551 Chronic vascular disorders of intestine: Secondary | ICD-10-CM

## 2020-04-23 DIAGNOSIS — Z9862 Peripheral vascular angioplasty status: Secondary | ICD-10-CM

## 2020-04-23 DIAGNOSIS — I701 Atherosclerosis of renal artery: Secondary | ICD-10-CM

## 2020-04-25 ENCOUNTER — Other Ambulatory Visit: Payer: Self-pay

## 2020-04-25 ENCOUNTER — Ambulatory Visit (INDEPENDENT_AMBULATORY_CARE_PROVIDER_SITE_OTHER): Payer: Medicare Other

## 2020-04-25 ENCOUNTER — Ambulatory Visit: Payer: Medicare Other

## 2020-04-25 ENCOUNTER — Encounter (INDEPENDENT_AMBULATORY_CARE_PROVIDER_SITE_OTHER): Payer: Self-pay | Admitting: Nurse Practitioner

## 2020-04-25 ENCOUNTER — Ambulatory Visit (INDEPENDENT_AMBULATORY_CARE_PROVIDER_SITE_OTHER): Payer: Medicare Other | Admitting: Nurse Practitioner

## 2020-04-25 VITALS — BP 171/80 | HR 62 | Resp 16

## 2020-04-25 DIAGNOSIS — Z87891 Personal history of nicotine dependence: Secondary | ICD-10-CM | POA: Diagnosis not present

## 2020-04-25 DIAGNOSIS — Z9862 Peripheral vascular angioplasty status: Secondary | ICD-10-CM | POA: Diagnosis not present

## 2020-04-25 DIAGNOSIS — I701 Atherosclerosis of renal artery: Secondary | ICD-10-CM

## 2020-04-25 DIAGNOSIS — E785 Hyperlipidemia, unspecified: Secondary | ICD-10-CM

## 2020-04-25 DIAGNOSIS — K551 Chronic vascular disorders of intestine: Secondary | ICD-10-CM

## 2020-04-25 DIAGNOSIS — F0280 Dementia in other diseases classified elsewhere without behavioral disturbance: Secondary | ICD-10-CM

## 2020-04-25 DIAGNOSIS — G301 Alzheimer's disease with late onset: Secondary | ICD-10-CM

## 2020-05-01 ENCOUNTER — Encounter (INDEPENDENT_AMBULATORY_CARE_PROVIDER_SITE_OTHER): Payer: Self-pay | Admitting: Nurse Practitioner

## 2020-05-01 NOTE — Progress Notes (Signed)
Subjective:    Patient ID: Sarah [name], female    DOB: [birth year], 79 y.o.   MRN: [id number] Chief Complaint  Patient presents with  . Follow-up    ultrasound follow up    Sarah [name] is a 79 y.o. female to the office for noninvasive studies regarding renal artery stenosis, in addition to mesenteric artery stenosis as well as known distal abdominal aortic aneurysm.  Patient has had a previous renal artery stent placed 11/21/2014 in the right renal artery.  The patient has advanced Alzheimer's therefore she is not a good historian however her grandson is present with her today and is able to give insight.  The patient's blood pressure is elevated today however she has not taken any blood pressure medication.  This is unusual from her normal blood pressure readings.  As a change from last year, the patient has stop smoking.  He said that incidentally the patient forgot about cigarettes and is has not picked up a cigarette since.  He denies noting any nausea or vomiting after meals.  There seems to be no aversion to food or food phobia.  There are no complaints of abdominal pain.  She generally has a healthy appetite and eats majority of her food.  The patient is also complaining of being very hungry this morning due to her studies being fasting.  No fever, chills, nausea, vomiting or diarrhea is noted.  The patient is also notably had some weight gain.  Today noninvasive studies show distal abdominal aortic aneurysm at a maximal diameter of 4.16 cm, slightly increased from 3.7 cm approximately 2 years ago.  The right renal artery does show some elevated velocities at the proximal portion of the renal artery however is elevated velocities are consistent with the previous studies done 1 year ago.  The patient also has velocities suggestive of a 1 to 59% stenosis, again this is consistent with last year.  The mesenteric arteries have a 70 to 99% stenosis in the celiac artery and superior mesenteric artery  which is also consistent with the previous exam on 03/24/2019.   Review of Systems  Neurological: Positive for weakness.  Psychiatric/Behavioral: Positive for confusion.  All other systems reviewed and are negative.      Objective:   Physical Exam Vitals reviewed. Exam conducted with a chaperone present (Grandson present).  HENT:     Head: Normocephalic.  Pulmonary:     Effort: Pulmonary effort is normal.     Breath sounds: Normal breath sounds.  Skin:    General: Skin is warm and dry.  Neurological:     Mental Status: She is alert. Mental status is at baseline.     BP (!) 171/80 (BP Location: Left Arm)   Pulse 62   Resp 16   Past Medical History:  Diagnosis Date  . Arthritis   . Dementia ([place])    ALZHEIMERS  . Ear anomaly    INNER EAR ISSUES  . Hyperlipidemia   . Hypertension     Social History   Socioeconomic History  . Marital status: Widowed    Spouse name: Not on file  . Number of children: 2  . Years of education: Not on file  . Highest education level: High school graduate  Occupational History  . Occupation: retired  Tobacco Use  . Smoking status: Former Smoker    Packs/day: 0.50    Years: 40.00    Pack years: 20.00    Types: Cigarettes  . Smokeless tobacco:  Never Used  . Tobacco comment: less than  Vaping Use  . Vaping Use: Never used  Substance and Sexual Activity  . Alcohol use: No    Alcohol/week: 0.0 standard drinks  . Drug use: No  . Sexual activity: Not on file  Other Topics Concern  . Not on file  Social History Narrative  . Not on file   Social Determinants of Health   Financial Resource Strain:   . Difficulty of Paying Living Expenses:   Food Insecurity: Unknown  . Worried About Charity fundraiser in the Last Year: Not on file  . Ran Out of Food in the Last Year: Never true  Transportation Needs:   . Lack of Transportation (Medical):   Marland Kitchen Lack of Transportation (Non-Medical):   Physical Activity: Inactive  . Days of  Exercise per Week: 0 days  . Minutes of Exercise per Session: 0 min  Stress:   . Feeling of Stress :   Social Connections: Unknown  . Frequency of Communication with Friends and Family: Patient refused  . Frequency of Social Gatherings with Friends and Family: Patient refused  . Attends Religious Services: Patient refused  . Active Member of Clubs or Organizations: Patient refused  . Attends Archivist Meetings: Patient refused  . Marital Status: Patient refused  Intimate Partner Violence: Unknown  . Fear of Current or Ex-Partner: Patient refused  . Emotionally Abused: Patient refused  . Physically Abused: Patient refused  . Sexually Abused: Patient refused    Past Surgical History:  Procedure Laterality Date  . APPENDECTOMY    . CATARACT EXTRACTION W/PHACO Right 03/09/2018   Procedure: CATARACT EXTRACTION PHACO AND INTRAOCULAR LENS PLACEMENT (IOC);  Surgeon: Birder Robson, MD;  Location: [facility] ORS;  Service: Ophthalmology;  Laterality: Right;  [place] 00:38.9 AP% 15.9 CDE 6.19 FLUID PACK LOT # [id number]  . CATARACT EXTRACTION W/PHACO Left 06/22/2018   Procedure: CATARACT EXTRACTION PHACO AND INTRAOCULAR LENS PLACEMENT (IOC);  Surgeon: Birder Robson, MD;  Location: [facility] ORS;  Service: Ophthalmology;  Laterality: Left;  [place] 00:50 AP% 16.5 CDE 8.25 Fluid pack lot # [id number]  . CESAREAN SECTION    . CHOLECYSTECTOMY    . CYSTOSCOPY WITH STENT PLACEMENT      Family History  Problem Relation Age of Onset  . Colon cancer Father   . Pancreatic cancer Sister   . GER disease Son   . Alzheimer's disease Sister     Allergies  Allergen Reactions  . Penicillins Hives and Other (See Comments)    Has patient had a PCN reaction causing immediate rash, facial/tongue/throat swelling, SOB or lightheadedness with hypotension: No Has patient had a PCN reaction causing severe rash involving mucus membranes or skin necrosis: No Has patient had a PCN reaction that required  hospitalization: No Has patient had a PCN reaction occurring within the last 10 years: No  If all of the above answers are "NO", then may proceed with Cephalosporin use.        Assessment & Plan:   1. Mesenteric artery stenosis (HCC) We will plan on having the patient continue with conservative monitoring.  The family is advised that if the patient should suddenly show signs of food phobia, nausea or vomiting or sudden weight loss this could be signs symptoms of worsening mesenteric stenosis.  As long as the patient is eating and has an appropriate weight we will continue with conservative observation.  The patient's abdominal aortic aneurysm will continue to be monitored.  It has shown  very slow growth, and consideration and discussion for repair would not be made in detail is at least 5 cm or less significant enlargement is seen on ultrasound.  We will have the patient return for noninvasive studies in 1 year or sooner if symptoms should develop.  2. Renal artery stenosis ([place]) Patient does have elevated velocities today however this is not consistent with patient's normal.  This is likely due to not taking her medications this morning.  Patient's family is advised that if the patient has sustained elevated blood pressures or suddenly worsening renal function, reevaluation of her renal artery should be done to plan or discuss for possible intervention if necessary.  Otherwise, we will see the patient return to the office in 1 year  3. Late onset Alzheimer's disease without behavioral disturbance [facility]) Patient is a poor historian however has extensive family support to help with care.  However, due to advanced dementia, in accordance with the family's wishes, care will be as conservative as possible.  4. Hyperlipidemia, unspecified hyperlipidemia type Continue statin as ordered and reviewed, no changes at this time   5. Personal history of tobacco use, presenting hazards to health Patient has  stop smoking and has been done for nearly 6 months.  Abstinence is encouraged   Current Outpatient Medications on File Prior to Visit  Medication Sig Dispense Refill  . acetaminophen (TYLENOL) 500 MG tablet Take 500 mg by mouth at bedtime.     Marland Kitchen CALCIUM CARBONATE-VITAMIN D PO Take 1 tablet by mouth 4 (four) times a week.     . cholecalciferol (VITAMIN D) 1000 units tablet Take 1,000 Units by mouth 4 (four) times a week.     . Cobalamin Combinations (FOLTRATE) 500-1 MCG-MG TABS Take by mouth.    . cyanocobalamin 1000 MCG tablet Take 1,000 mcg by mouth 4 (four) times a week.     Marland Kitchen lisinopril (ZESTRIL) 20 MG tablet Take 1 Tablet By Mouth Daily. 30 tablet 2  . Probiotic CAPS Take 1 capsule by mouth daily.    . vitamin C (ASCORBIC ACID) 500 MG tablet Take 500 mg by mouth daily.    . vitamin E 400 UNIT capsule Take 400 Units by mouth 4 (four) times a week.      Current Facility-Administered Medications on File Prior to Visit  Medication Dose Route Frequency Provider Last Rate Last Admin  . lidocaine (PF) (XYLOCAINE) 1 % injection 2 mL  2 mL Intradermal Once Virginia Crews, MD      . lidocaine (PF) (XYLOCAINE) 1 % injection 2 mL  2 mL Intradermal Once Virginia Crews, MD      . lidocaine (PF) (XYLOCAINE) 1 % injection 2 mL  2 mL Intradermal Once Virginia Crews, MD      . lidocaine (PF) (XYLOCAINE) 1 % injection 2 mL  2 mL Intradermal Once Virginia Crews, MD      . methylPREDNISolone acetate (DEPO-MEDROL) injection 40 mg  40 mg Intramuscular Once Virginia Crews, MD      . methylPREDNISolone acetate (DEPO-MEDROL) injection 40 mg  40 mg Intramuscular Once Virginia Crews, MD        There are no Patient Instructions on file for this visit. No follow-ups on file.   Kris Hartmann, NP

## 2020-05-02 ENCOUNTER — Ambulatory Visit: Payer: Medicare Other | Attending: Family Medicine

## 2020-05-02 ENCOUNTER — Other Ambulatory Visit: Payer: Self-pay

## 2020-05-02 DIAGNOSIS — R2681 Unsteadiness on feet: Secondary | ICD-10-CM | POA: Insufficient documentation

## 2020-05-02 DIAGNOSIS — Z9181 History of falling: Secondary | ICD-10-CM | POA: Diagnosis not present

## 2020-05-02 NOTE — Therapy (Signed)
[place] MAIN [facility] SERVICES [address] [place], [place], [address] Phone: [phone]   Fax:  [phone]  Physical Therapy Treatment  Patient Details  Name: Sarah [name] MRN: [id number] Date of Birth: [birth year] Referring Provider (PT): Dr. Rosanna Randy   Encounter Date: 05/02/2020   PT End of Session - 05/02/20 0939    Visit Number 23    Number of Visits 49    Date for PT Re-Evaluation 06/27/20    Authorization Type eval: 10/31/19, PN: 06/27/20    PT Start Time 0277    PT Stop Time 1022    PT Time Calculation (min) 43 min    Equipment Utilized During Treatment Gait belt    Activity Tolerance Patient tolerated treatment well    Behavior During Therapy [facility] for tasks assessed/performed           Past Medical History:  Diagnosis Date  . Arthritis   . Dementia ([place])    ALZHEIMERS  . Ear anomaly    INNER EAR ISSUES  . Hyperlipidemia   . Hypertension     Past Surgical History:  Procedure Laterality Date  . APPENDECTOMY    . CATARACT EXTRACTION W/PHACO Right 03/09/2018   Procedure: CATARACT EXTRACTION PHACO AND INTRAOCULAR LENS PLACEMENT (IOC);  Surgeon: Birder Robson, MD;  Location: [facility] ORS;  Service: Ophthalmology;  Laterality: Right;  [place] 00:38.9 AP% 15.9 CDE 6.19 FLUID PACK LOT # [id number]  . CATARACT EXTRACTION W/PHACO Left 06/22/2018   Procedure: CATARACT EXTRACTION PHACO AND INTRAOCULAR LENS PLACEMENT (IOC);  Surgeon: Birder Robson, MD;  Location: [facility] ORS;  Service: Ophthalmology;  Laterality: Left;  [place] 00:50 AP% 16.5 CDE 8.25 Fluid pack lot # [id number]  . CESAREAN SECTION    . CHOLECYSTECTOMY    . CYSTOSCOPY WITH STENT PLACEMENT      There were no vitals filed for this visit.   Subjective Assessment - 05/02/20 0939    Subjective Pt/grandson report doing well today. Has been working on HEP some, otherwise has been doing more engaging activity in house. Patient had one fall over the past week. She was not able to get  up or walk immediately, but 20 minutes later she was walking fine and no lingering effects. Grandson reports some increased confusion over the last few weeks but notably more over the last few days. No overt signs of infection.    Patient is accompained by: Family member    Pertinent History Sarah [name] is a 79 y.o. female with a history of severe Alzheimer's dementia, hypertension who was brought to the ED due to a fall at home. She hit the back of her head resulting in bleeding.  She was treated with staples to close her wound and had a follow-up appointment with her PCP who ordered PT due to fall. Per grandson patient's Alzheimer's has progressed significantly over the last few months. He states that she likes to walk but is unstable. She has had 2 falls in the last 6 months. Per grandson at baseline she is AOx1 and is able to recognize her son and grandson with whom she lives. She does not participate in any regular exercise.    Limitations Walking    Patient Stated Goals Improve balance and safety    Currently in Pain? No/denies             TREATMENT   Ther-ex Verbal/visual/tactile cues provided, PT also demonstrated/performed exercises with pt to facilitate: NuStep L2 x 5 minutes for warm-up; BLE  leg press 40# 2 x 20; Sit to stand with BUE support from therapist x 10; Sit to stand on foam with BUE support from therapist x 10; Standing marches with BUE support from therapist x 10; Seated clams with manual resistance from therapist x 10 BLE; Seated adductor squeeze with manual resistance from therapist x 10 BLE; Mini squats BUE support from parallel bars x 10 Heel raises BUE support from parallel bars x 10   Neuromuscular Re-education: CGA and handheld assist provided forexercises Soccer kicks with student PT and therapist guarding, pt challenged to utilize alternating legs to kick ball; Balloon taps with student PT and therapist guarding, pt encouraged to hit balloon  harder and farther and also change hands; Therapist danced with patientto tango musicin order to work on balance. Performedside stepping in both directions,forward/backward stepping, 180/360 degree turns, and cross-over stepsto both directions;   Pt educated throughout session about proper posture and technique with exercises. Improved exercise technique, movement at target joints, use of target muscles after min to mod verbal, visual, tactile cues.   Pt requires repeated redirection during session due to advanced dementia.She continues to demonstrate good motivation during sessions butrequirescreative activities for compliance/performance. She appears slightly more confused today. Grandson advised to contact PCP and inquire if she might have a UTI given her notable increase in confusion recently. Grandson agrees. She is able to perform seated and standing exercises and does not complain of any increase in knee pain. Utilized dynamic activities for balance such as ball kicks and balloon taps. Once again utilized dancing for balance trainingtodayat end of session and pt demonstrates improved ability to perform cross-over stepping.Pt will benefit from PT services to address deficits in strength, balance, and mobility in order to return to full function at home.                       PT Short Term Goals - 04/04/20 1023      PT SHORT TERM GOAL #1   Title Pt will be participate with HEP at the direction of her caregiver in order to improve strength and balance in order to decrease fall risk and improve function at home.    Baseline 04/04/20: Pt resistant to HEP due to dementia    Time 6    Period Weeks    Status Partially Met    Target Date 05/16/20             PT Long Term Goals - 04/04/20 1023      PT LONG TERM GOAL #1   Title Pt will improve BERG by to at least 40/56 in order to demonstrate clinically significant improvement in balance.    Baseline 10/31/19:  30/56; 01/18/20: 36/56; 04/04/20: 43/56    Time 12    Period Weeks    Status Achieved    Target Date --      PT LONG TERM GOAL #2   Title Pt will decrease TUG to below 14 seconds/decrease in order to demonstrate decreased fall risk.    Baseline 10/31/19: 44.3s; 01/18/20: 32.8s; 04/04/20: 32.1s    Time 12    Period Weeks    Status Partially Met    Target Date 06/27/20      PT LONG TERM GOAL #3   Title Pt will increase 10MWT to at least 0.50 m/s in order to demonstrate clinically significant improvement in community ambulation.    Baseline 10/31/19: Self-selected: 57.4s =  0.17 m/s; 01/18/20: Self-selected: 29.4s =  0.34 m/s; 04/04/20: Self-selected: 25.6s =  0.39 m/s;    Time 12    Period Weeks    Status Partially Met    Target Date 06/27/20      PT LONG TERM GOAL #4   Title Pt will be able to perform sit to stand from a regular height chair without UE support in order to demonstrate improved leg strength and safety    Baseline 10/31/19: Heavy UE dependence with sit to stand transfers; 01/18/20: Able to stand without UE reliance however prefers to place hands on arm rests; 04/04/20: Unchanged    Time 12    Period Weeks    Status Achieved                 Plan - 05/02/20 0940    Clinical Impression Statement Pt requires repeated redirection during session due to advanced dementia. She continues to demonstrate good motivation during sessions but requires creative activities for compliance/performance. She appears slightly more confused today. Grandson advised to contact PCP and inquire if she might have a UTI given her notable increase in confusion recently. Grandson agrees. She is able to perform seated and standing exercises and does not complain of any increase in knee pain. Utilized dynamic activities for balance such as ball kicks and balloon taps. Once again utilized dancing for balance training today at end of session and pt demonstrates improved ability to perform cross-over stepping. Pt  will benefit from PT services to address deficits in strength, balance, and mobility in order to return to full function at home.    Personal Factors and Comorbidities Age;Comorbidity 3+    Comorbidities Alzheimer's, anxiety/depression, arthritis    Examination-Activity Limitations Bathing;Bed Mobility;Caring for Others;Carry;Dressing;Hygiene/Grooming;Lift;Locomotion Level;Squat;Stand;Transfers    Examination-Participation Restrictions Community Activity;Driving;Interpersonal Relationship;Laundry;Medication Management;Meal Prep;Personal Finances;Shop    Stability/Clinical Decision Making Unstable/Unpredictable    Rehab Potential Fair    PT Frequency 1x / week    PT Duration 12 weeks    PT Treatment/Interventions ADLs/Self Care Home Management;Aquatic Therapy;Biofeedback;Canalith Repostioning;Cryotherapy;Electrical Stimulation;Iontophoresis 17m/ml Dexamethasone;Moist Heat;Traction;Ultrasound;DME Instruction;Gait training;Stair training;Functional mobility training;Therapeutic activities;Therapeutic exercise;Balance training;Neuromuscular re-education;Cognitive remediation;Patient/family education;Manual techniques;Passive range of motion;Dry needling;Vestibular;Joint Manipulations    PT Next Visit Plan Progress balance and strengthening    PT Home Exercise Plan seated marches, seated clams, seated LAQ, feet together balance, slow marching, sit to stand with minimal UE dependendence    Consulted and Agree with Plan of Care Family member/caregiver    Family Member Consulted GAdolphus [name]          Patient will benefit from skilled therapeutic intervention in order to improve the following deficits and impairments:  Abnormal gait, Decreased balance, Decreased cognition, Decreased knowledge of precautions, Decreased safety awareness, Decreased strength, Difficulty walking  Visit Diagnosis: Unsteadiness on feet  History of falling     Problem List Patient Active Problem List   Diagnosis  Date Noted  . Malnutrition of moderate degree ([place] 01/25/2020  . Chronic kidney disease due to hypertension 07/24/2019  . Mesenteric artery stenosis ([place] 03/24/2019  . Renal artery stenosis ([facility] 01/26/2017  . Personal history of tobacco use, presenting hazards to health 06/23/2016  . Depressive disorder 05/29/2015  . Dementia ([place] 05/29/2015  . Allergic rhinitis 03/05/2015  . Weak pulse 03/05/2015  . Smokes tobacco daily 03/05/2015  . Generalized anxiety disorder 03/05/2015  . Acid reflux 03/05/2015  . HLD (hyperlipidemia) 03/05/2015  . Affective disorder, major 03/05/2015  . Bad memory 03/05/2015  . Arthritis, degenerative 03/05/2015  . OP (osteoporosis) 03/05/2015  .  Renovascular hypertension 03/05/2015  . Scoliosis 03/05/2015  . Avitaminosis D 03/05/2015  . Weight loss 11/07/2014  . Family history of colon cancer 11/07/2014   Sarah [name] PT, DPT, GCS  Sarah [name] 05/02/2020, 12:33 PM  [place] MAIN [facility] SERVICES [address] [place], [place], [address] Phone: [phone]   Fax:  [phone]  Name: Sarah [name] MRN: [id number] Date of Birth: 1941/04/03

## 2020-05-09 ENCOUNTER — Ambulatory Visit: Payer: Medicare Other

## 2020-05-09 ENCOUNTER — Other Ambulatory Visit: Payer: Self-pay

## 2020-05-09 DIAGNOSIS — Z9181 History of falling: Secondary | ICD-10-CM

## 2020-05-09 DIAGNOSIS — R2681 Unsteadiness on feet: Secondary | ICD-10-CM | POA: Diagnosis not present

## 2020-05-09 NOTE — Therapy (Signed)
[place] MAIN [facility] SERVICES [address] [place], [place], [address] Phone: [phone]   Fax:  [phone]  Physical Therapy Treatment  Patient Details  Name: Sarah [name] MRN: [id number] Date of Birth: 04/12/1941 Referring Provider (PT): Dr. Rosanna Randy   Encounter Date: 05/09/2020   PT End of Session - 05/09/20 0950    Visit Number 24    Number of Visits 49    Date for PT Re-Evaluation 06/27/20    Authorization Type eval: 10/31/19, PN: 06/27/20    PT Start Time 0945    PT Stop Time 1015    PT Time Calculation (min) 30 min    Equipment Utilized During Treatment Gait belt    Activity Tolerance Patient tolerated treatment well    Behavior During Therapy [facility] for tasks assessed/performed           Past Medical History:  Diagnosis Date  . Arthritis   . Dementia ([place])    ALZHEIMERS  . Ear anomaly    INNER EAR ISSUES  . Hyperlipidemia   . Hypertension     Past Surgical History:  Procedure Laterality Date  . APPENDECTOMY    . CATARACT EXTRACTION W/PHACO Right 03/09/2018   Procedure: CATARACT EXTRACTION PHACO AND INTRAOCULAR LENS PLACEMENT (IOC);  Surgeon: Birder Robson, MD;  Location: [facility] ORS;  Service: Ophthalmology;  Laterality: Right;  [place] 00:38.9 AP% 15.9 CDE 6.19 FLUID PACK LOT # [id number] H  . CATARACT EXTRACTION W/PHACO Left 06/22/2018   Procedure: CATARACT EXTRACTION PHACO AND INTRAOCULAR LENS PLACEMENT (IOC);  Surgeon: Birder Robson, MD;  Location: [facility] ORS;  Service: Ophthalmology;  Laterality: Left;  [place] 00:50 AP% 16.5 CDE 8.25 Fluid pack lot # [id number] H  . CESAREAN SECTION    . CHOLECYSTECTOMY    . CYSTOSCOPY WITH STENT PLACEMENT      There were no vitals filed for this visit.   Subjective Assessment - 05/09/20 0950    Subjective Pt/grandson report doing alright today. Slight improvement in confusion over the last week however for the last couple days grandson reports that pt has been having increased difficulty  with her balance and is shuffling her feet more.  Patient has not had any falls since last therapy session. No specific questions upon arrival.    Patient is accompained by: Family member    Pertinent History Sarah [name] is a 79 y.o. female with a history of severe Alzheimer's dementia, hypertension who was brought to the ED due to a fall at home. She hit the back of her head resulting in bleeding.  She was treated with staples to close her wound and had a follow-up appointment with her PCP who ordered PT due to fall. Per grandson patient's Alzheimer's has progressed significantly over the last few months. He states that she likes to walk but is unstable. She has had 2 falls in the last 6 months. Per grandson at baseline she is AOx1 and is able to recognize her son and grandson with whom she lives. She does not participate in any regular exercise.    Limitations Walking    Patient Stated Goals Improve balance and safety    Currently in Pain? No/denies            TREATMENT   Ther-ex Verbal/visual/tactile cues provided, PT also demonstrated/performed exercises with pt to facilitate: NuStep L2 x 5 minutes for warm-up; BLE leg press 25# x 20, 40# x 20;   Neuromuscular Re-education: CGA and handheld assist provided forexercises Agility ladder stepping  in order to encourage increased step length, pt struggles to follow commands today or increased step length with external cues, 2 lengths performed; Gait in rehab gym using metronome in order to attempt to decrease cadence and increase step length, 30' x 2, pt continues to struggle with command follow and step length; Balloon taps with student PT and therapist guarding, pt demonstrates improved performance today compared to previous session; Therapist danced with patientto tango musicin order to work on balance. Performedside stepping in both directions,forward/backward stepping, 180/360 degree turns, and cross-over stepsto both  directions;   Pt educated throughout session about proper posture and technique with exercises. Improved exercise technique, movement at target joints, use of target muscles after min to mod verbal, visual, tactile cues.   Pt requires repeated redirection during session due to advanced dementia.She continues to demonstrate good motivation during sessions butrequirescreative activities for compliance/performance. She does struggle today more notably with command follow and pt demonstrates significant increase in her shuffling gait with crouched knee posture in standing. She does not complain of any increase in her knee pain during session. Utilized dynamic activities for balloon taps and use of agility ladder/metronome. Once again utilized dancing for balance trainingtodayat end of session and pt demonstrates improved ability to perform cross-over stepping.Pt will benefit from PT services to address deficits in strength, balance, and mobility in order to return to full function at home.                                PT Short Term Goals - 04/04/20 1023      PT SHORT TERM GOAL #1   Title Pt will be participate with HEP at the direction of her caregiver in order to improve strength and balance in order to decrease fall risk and improve function at home.    Baseline 04/04/20: Pt resistant to HEP due to dementia    Time 6    Period Weeks    Status Partially Met    Target Date 05/16/20             PT Long Term Goals - 04/04/20 1023      PT LONG TERM GOAL #1   Title Pt will improve BERG by to at least 40/56 in order to demonstrate clinically significant improvement in balance.    Baseline 10/31/19: 30/56; 01/18/20: 36/56; 04/04/20: 43/56    Time 12    Period Weeks    Status Achieved    Target Date --      PT LONG TERM GOAL #2   Title Pt will decrease TUG to below 14 seconds/decrease in order to demonstrate decreased fall risk.    Baseline 10/31/19: 44.3s;  01/18/20: 32.8s; 04/04/20: 32.1s    Time 12    Period Weeks    Status Partially Met    Target Date 06/27/20      PT LONG TERM GOAL #3   Title Pt will increase 10MWT to at least 0.50 m/s in order to demonstrate clinically significant improvement in community ambulation.    Baseline 10/31/19: Self-selected: 57.4s =  0.17 m/s; 01/18/20: Self-selected: 29.4s =  0.34 m/s; 04/04/20: Self-selected: 25.6s =  0.39 m/s;    Time 12    Period Weeks    Status Partially Met    Target Date 06/27/20      PT LONG TERM GOAL #4   Title Pt will be able to perform sit to stand from a  regular height chair without UE support in order to demonstrate improved leg strength and safety    Baseline 10/31/19: Heavy UE dependence with sit to stand transfers; 01/18/20: Able to stand without UE reliance however prefers to place hands on arm rests; 04/04/20: Unchanged    Time 12    Period Weeks    Status Achieved                 Plan - 05/09/20 0950    Clinical Impression Statement Pt requires repeated redirection during session due to advanced dementia. She continues to demonstrate good motivation during sessions but requires creative activities for compliance/performance. She does struggle today more notably with command follow and pt demonstrates significant increase in her shuffling gait with crouched knee posture in standing. She does not complain of any increase in her knee pain during session. Utilized dynamic activities for balloon taps and use of agility ladder/metronome. Once again utilized dancing for balance training today at end of session and pt demonstrates improved ability to perform cross-over stepping. Pt will benefit from PT services to address deficits in strength, balance, and mobility in order to return to full function at home.    Personal Factors and Comorbidities Age;Comorbidity 3+    Comorbidities Alzheimer's, anxiety/depression, arthritis    Examination-Activity Limitations Bathing;Bed Mobility;Caring  for Others;Carry;Dressing;Hygiene/Grooming;Lift;Locomotion Level;Squat;Stand;Transfers    Examination-Participation Restrictions Community Activity;Driving;Interpersonal Relationship;Laundry;Medication Management;Meal Prep;Personal Finances;Shop    Stability/Clinical Decision Making Unstable/Unpredictable    Rehab Potential Fair    PT Frequency 1x / week    PT Duration 12 weeks    PT Treatment/Interventions ADLs/Self Care Home Management;Aquatic Therapy;Biofeedback;Canalith Repostioning;Cryotherapy;Electrical Stimulation;Iontophoresis 22m/ml Dexamethasone;Moist Heat;Traction;Ultrasound;DME Instruction;Gait training;Stair training;Functional mobility training;Therapeutic activities;Therapeutic exercise;Balance training;Neuromuscular re-education;Cognitive remediation;Patient/family education;Manual techniques;Passive range of motion;Dry needling;Vestibular;Joint Manipulations    PT Next Visit Plan Progress balance and strengthening    PT Home Exercise Plan seated marches, seated clams, seated LAQ, feet together balance, slow marching, sit to stand with minimal UE dependendence    Consulted and Agree with Plan of Care Family member/caregiver    Family Member Consulted GAdolphus Birchwood          Patient will benefit from skilled therapeutic intervention in order to improve the following deficits and impairments:  Abnormal gait, Decreased balance, Decreased cognition, Decreased knowledge of precautions, Decreased safety awareness, Decreased strength, Difficulty walking  Visit Diagnosis: Unsteadiness on feet  History of falling     Problem List Patient Active Problem List   Diagnosis Date Noted  . Malnutrition of moderate degree ([place] 01/25/2020  . Chronic kidney disease due to hypertension 07/24/2019  . Mesenteric artery stenosis ([place] 03/24/2019  . Renal artery stenosis ([place] 01/26/2017  . Personal history of tobacco use, presenting hazards to health 06/23/2016  . Depressive disorder  05/29/2015  . Dementia ([place] 05/29/2015  . Allergic rhinitis 03/05/2015  . Weak pulse 03/05/2015  . Smokes tobacco daily 03/05/2015  . Generalized anxiety disorder 03/05/2015  . Acid reflux 03/05/2015  . HLD (hyperlipidemia) 03/05/2015  . Affective disorder, major 03/05/2015  . Bad memory 03/05/2015  . Arthritis, degenerative 03/05/2015  . OP (osteoporosis) 03/05/2015  . Renovascular hypertension 03/05/2015  . Scoliosis 03/05/2015  . Avitaminosis D 03/05/2015  . Weight loss 11/07/2014  . Family history of colon cancer 11/07/2014   JPhillips GroutPT, DPT, GCS  Moksha Dorgan 05/09/2020, 10:42 AM  [place] [facility] [address]RK[address] [place] [address]Phone: [phone]  Fax:  [phone] Name: MJessiah Wojnar  Kotowski MRN: [id number] Date of Birth: 02/06/1941

## 2020-05-16 ENCOUNTER — Other Ambulatory Visit: Payer: Self-pay

## 2020-05-16 ENCOUNTER — Ambulatory Visit: Payer: Medicare Other

## 2020-05-16 DIAGNOSIS — Z9181 History of falling: Secondary | ICD-10-CM | POA: Diagnosis not present

## 2020-05-16 DIAGNOSIS — R2681 Unsteadiness on feet: Secondary | ICD-10-CM

## 2020-05-16 NOTE — Therapy (Signed)
[place] MAIN [facility] SERVICES [address] [place], [place], [address] Phone: [phone]   Fax:  [phone]  Physical Therapy Treatment  Patient Details  Name: Sarah [name] MRN: [id number] Date of Birth: 1941-09-22 Referring Provider (PT): Dr. Rosanna Randy   Encounter Date: 05/16/2020   PT End of Session - 05/16/20 1028    Visit Number 25    Number of Visits 49    Date for PT Re-Evaluation 06/27/20    Authorization Type eval: 10/31/19, PN: 06/27/20    PT Start Time 0940    PT Stop Time 1020    PT Time Calculation (min) 40 min    Equipment Utilized During Treatment Gait belt    Activity Tolerance Patient tolerated treatment well    Behavior During Therapy [facility] for tasks assessed/performed           Past Medical History:  Diagnosis Date  . Arthritis   . Dementia ([place])    ALZHEIMERS  . Ear anomaly    INNER EAR ISSUES  . Hyperlipidemia   . Hypertension     Past Surgical History:  Procedure Laterality Date  . APPENDECTOMY    . CATARACT EXTRACTION W/PHACO Right 03/09/2018   Procedure: CATARACT EXTRACTION PHACO AND INTRAOCULAR LENS PLACEMENT (IOC);  Surgeon: Birder Robson, MD;  Location: [facility] ORS;  Service: Ophthalmology;  Laterality: Right;  [place] 00:38.9 AP% 15.9 CDE 6.19 FLUID PACK LOT # [id number]  . CATARACT EXTRACTION W/PHACO Left 06/22/2018   Procedure: CATARACT EXTRACTION PHACO AND INTRAOCULAR LENS PLACEMENT (IOC);  Surgeon: Birder Robson, MD;  Location: [facility] ORS;  Service: Ophthalmology;  Laterality: Left;  [place] 00:50 AP% 16.5 CDE 8.25 Fluid pack lot # [id number] H  . CESAREAN SECTION    . CHOLECYSTECTOMY    . CYSTOSCOPY WITH STENT PLACEMENT      There were no vitals filed for this visit.   Subjective Assessment - 05/16/20 0944    Subjective Pt/grandson report doing good today. She had a pretty good week. She sees her PCP next week due to concerns of UTI. She doesn't report any knee pain, but rubs her knees occasionally.  Patient has not had any falls since last therapy session. No specific questions upon arrival.    Patient is accompained by: Family member    Pertinent History Sarah [name] is a 79 y.o. female with a history of severe Alzheimer's dementia, hypertension who was brought to the ED due to a fall at home. She hit the back of her head resulting in bleeding.  She was treated with staples to close her wound and had a follow-up appointment with her PCP who ordered PT due to fall. Per grandson patient's Alzheimer's has progressed significantly over the last few months. He states that she likes to walk but is unstable. She has had 2 falls in the last 6 months. Per grandson at baseline she is AOx1 and is able to recognize her son and grandson with whom she lives. She does not participate in any regular exercise.    Limitations Walking    Patient Stated Goals Improve balance and safety    Currently in Pain? No/denies              TREATMENT    Ther-ex  Verbal/visual/tactile cues provided, PT also demonstrated/performed exercises with pt to facilitate: NuStep L2 x 5 minutes for warm-up during history taking; Sit to stand on foam with L UE support from railing, R UE support form therapist 2 x 10; Standing  marches with BUE support from therapist x 10, visual cues used to get full ROM, painful on knees so stopped Seated clams with manual resistance from therapist x 10 BLE; Seated adductor squeeze with ball x 10 BLE; Mini squats BUE support from parallel bars x 10, painful on knees so stopped Heel raises BUE support from parallel bars x 15    Neuromuscular Re-education: CGA and handheld assist provided for exercises Soccer kicks with therapist and student PT guarding, pt challenged to utilize alternating legs to kick ball; Balloon taps with grandson and student PT guarding, pt encouraged to hit balloon harder and farther and also change hands; Gait with ball tosses to therapist with SPT guarding, cues to  walk and toss ball; Therapist danced with patient to tango music in order to work on balance. Performed side stepping in both directions, forward/backward stepping, 180/360 degree turns, and cross-over steps to both directions.    Pt educated throughout session about proper posture and technique with exercises. Improved exercise technique, movement at target joints, use of target muscles after min to mod verbal, visual, tactile cues.      Pt requires repeated redirection during session due to advanced dementia. She continues to demonstrate good motivation during sessions but requires creative activities for compliance/performance which was better today. Counting out loud seemed to help with performance. She is able to perform seated and standing exercises, but does complain of some knee soreness. Utilized dynamic activities for balance such as ball kicks, balloon taps, and ball tosses. Once again utilized dancing for balance training today at end of session and pt demonstrates improved ability to perform cross-over stepping. Pt will benefit from PT services to address deficits in strength, balance, and mobility in order to return to full function at home.               PT Short Term Goals - 04/04/20 1023      PT SHORT TERM GOAL #1   Title Pt will be participate with HEP at the direction of her caregiver in order to improve strength and balance in order to decrease fall risk and improve function at home.    Baseline 04/04/20: Pt resistant to HEP due to dementia    Time 6    Period Weeks    Status Partially Met    Target Date 05/16/20             PT Long Term Goals - 04/04/20 1023      PT LONG TERM GOAL #1   Title Pt will improve BERG by to at least 40/56 in order to demonstrate clinically significant improvement in balance.    Baseline 10/31/19: 30/56; 01/18/20: 36/56; 04/04/20: 43/56    Time 12    Period Weeks    Status Achieved    Target Date --      PT LONG TERM GOAL #2    Title Pt will decrease TUG to below 14 seconds/decrease in order to demonstrate decreased fall risk.    Baseline 10/31/19: 44.3s; 01/18/20: 32.8s; 04/04/20: 32.1s    Time 12    Period Weeks    Status Partially Met    Target Date 06/27/20      PT LONG TERM GOAL #3   Title Pt will increase 10MWT to at least 0.50 m/s in order to demonstrate clinically significant improvement in community ambulation.    Baseline 10/31/19: Self-selected: 57.4s =  0.17 m/s; 01/18/20: Self-selected: 29.4s =  0.34 m/s; 04/04/20: Self-selected: 25.6s =  0.39  m/s;    Time 12    Period Weeks    Status Partially Met    Target Date 06/27/20      PT LONG TERM GOAL #4   Title Pt will be able to perform sit to stand from a regular height chair without UE support in order to demonstrate improved leg strength and safety    Baseline 10/31/19: Heavy UE dependence with sit to stand transfers; 01/18/20: Able to stand without UE reliance however prefers to place hands on arm rests; 04/04/20: Unchanged    Time 12    Period Weeks    Status Achieved                 Plan - 05/16/20 1028    Clinical Impression Statement Pt requires repeated redirection during session due to advanced dementia. She continues to demonstrate good motivation during sessions but requires creative activities for compliance/performance which was better today. Counting out loud seemed to help with performance. She is able to perform seated and standing exercises, but does complain of some knee soreness. Utilized dynamic activities for balance such as ball kicks, balloon taps, and ball tosses. Once again utilized dancing for balance training today at end of session and pt demonstrates improved ability to perform cross-over stepping. Pt will benefit from PT services to address deficits in strength, balance, and mobility in order to return to full function at home.    Personal Factors and Comorbidities Age;Comorbidity 3+    Comorbidities Alzheimer's, anxiety/depression,  arthritis    Examination-Activity Limitations Bathing;Bed Mobility;Caring for Others;Carry;Dressing;Hygiene/Grooming;Lift;Locomotion Level;Squat;Stand;Transfers    Examination-Participation Restrictions Community Activity;Driving;Interpersonal Relationship;Laundry;Medication Management;Meal Prep;Personal Finances;Shop    Stability/Clinical Decision Making Unstable/Unpredictable    Rehab Potential Fair    PT Frequency 1x / week    PT Duration 12 weeks    PT Treatment/Interventions ADLs/Self Care Home Management;Aquatic Therapy;Biofeedback;Canalith Repostioning;Cryotherapy;Electrical Stimulation;Iontophoresis 59m/ml Dexamethasone;Moist Heat;Traction;Ultrasound;DME Instruction;Gait training;Stair training;Functional mobility training;Therapeutic activities;Therapeutic exercise;Balance training;Neuromuscular re-education;Cognitive remediation;Patient/family education;Manual techniques;Passive range of motion;Dry needling;Vestibular;Joint Manipulations    PT Next Visit Plan Progress balance and strengthening    PT Home Exercise Plan seated marches, seated clams, seated LAQ, feet together balance, slow marching, sit to stand with minimal UE dependendence    Consulted and Agree with Plan of Care Family member/caregiver    Family Member Consulted GAdolphus Birchwood          Patient will benefit from skilled therapeutic intervention in order to improve the following deficits and impairments:  Abnormal gait, Decreased balance, Decreased cognition, Decreased knowledge of precautions, Decreased safety awareness, Decreased strength, Difficulty walking  Visit Diagnosis: Unsteadiness on feet  History of falling     Problem List Patient Active Problem List   Diagnosis Date Noted  . Malnutrition of moderate degree ([place] 01/25/2020  . Chronic kidney disease due to hypertension 07/24/2019  . Mesenteric artery stenosis ([place] 03/24/2019  . Renal artery stenosis ([place] 01/26/2017  . Personal history of  tobacco use, presenting hazards to health 06/23/2016  . Depressive disorder 05/29/2015  . Dementia ([place] 05/29/2015  . Allergic rhinitis 03/05/2015  . Weak pulse 03/05/2015  . Smokes tobacco daily 03/05/2015  . Generalized anxiety disorder 03/05/2015  . Acid reflux 03/05/2015  . HLD (hyperlipidemia) 03/05/2015  . Affective disorder, major 03/05/2015  . Bad memory 03/05/2015  . Arthritis, degenerative 03/05/2015  . OP (osteoporosis) 03/05/2015  . Renovascular hypertension 03/05/2015  . Scoliosis 03/05/2015  . Avitaminosis D 03/05/2015  . Weight loss 11/07/2014  . Family history of colon cancer 11/07/2014  This entire session was performed under direct supervision and direction of a licensed therapist/therapist assistant . I have personally read, edited and approve of the note as written.   Phillips Grout PT, DPT, GCS  Kimmie Madison Albea SPT Huprich,Jason 05/16/2020, 2:53 PM  [place] MAIN [facility] SERVICES [address] [place], [place], [address] Phone: [phone]   Fax:  [phone]  Name: DEVI HOPMAN MRN: [id number] Date of Birth: 11/06/1940

## 2020-05-18 NOTE — Progress Notes (Signed)
Trena Platt Cummings,acting as a scribe for Wilhemena Durie, MD.,have documented all relevant documentation on the behalf of Wilhemena Durie, MD,as directed by  Wilhemena Durie, MD while in the presence of Wilhemena Durie, MD.  Established patient visit   Patient: Sarah [name]   DOB: 27-Oct-1941   79 y.o. Female  MRN: [id number] Visit Date: 05/23/2020  Today's healthcare provider: Wilhemena Durie, MD   Chief Complaint  Patient presents with  . bladder incontinence  . mood swings   Subjective    HPI   Patients presents today in office for bladder incontinence and mood swings. Patient's grandson says that her mood swings have been going on for about two weeks, says that she has been mad and upset and it fluctuates. Grandson said that her bladder incontinence started around the same time, she is peeing in the bed and having a hard time getting to the restroom in time.  He said that he has not noticed any urinary frequency and she has not complained of any other urinary symptoms.  Otherwise patient is doing fairly well.  Everything is stable.  Patient's grandson will bring back urine specimen.       Medications: Outpatient Medications Prior to Visit  Medication Sig  . acetaminophen (TYLENOL) 500 MG tablet Take 500 mg by mouth at bedtime.   Marland Kitchen CALCIUM CARBONATE-VITAMIN D PO Take 1 tablet by mouth 4 (four) times a week.   . cholecalciferol (VITAMIN D) 1000 units tablet Take 1,000 Units by mouth 4 (four) times a week.   . Cobalamin Combinations (FOLTRATE) 500-1 MCG-MG TABS Take by mouth.  . cyanocobalamin 1000 MCG tablet Take 1,000 mcg by mouth 4 (four) times a week.   Marland Kitchen lisinopril (ZESTRIL) 20 MG tablet Take 1 Tablet By Mouth Daily.  . Probiotic CAPS Take 1 capsule by mouth daily.  . vitamin C (ASCORBIC ACID) 500 MG tablet Take 500 mg by mouth daily.  . vitamin E 400 UNIT capsule Take 400 Units by mouth 4 (four) times a week.    Facility-Administered Medications Prior  to Visit  Medication Dose Route Frequency Provider  . lidocaine (PF) (XYLOCAINE) 1 % injection 2 mL  2 mL Intradermal Once Virginia Crews, MD  . lidocaine (PF) (XYLOCAINE) 1 % injection 2 mL  2 mL Intradermal Once Virginia Crews, MD  . lidocaine (PF) (XYLOCAINE) 1 % injection 2 mL  2 mL Intradermal Once Virginia Crews, MD  . lidocaine (PF) (XYLOCAINE) 1 % injection 2 mL  2 mL Intradermal Once Virginia Crews, MD  . methylPREDNISolone acetate (DEPO-MEDROL) injection 40 mg  40 mg Intramuscular Once Virginia Crews, MD  . methylPREDNISolone acetate (DEPO-MEDROL) injection 40 mg  40 mg Intramuscular Once Virginia Crews, MD    Review of Systems  Constitutional: Negative for appetite change, chills, fatigue and fever.  Respiratory: Negative for chest tightness and shortness of breath.   Cardiovascular: Negative for chest pain and palpitations.  Gastrointestinal: Negative for abdominal pain, nausea and vomiting.  Neurological: Negative for dizziness and weakness.       Objective    BP (!) 156/75 (BP Location: Right Arm, Patient Position: Sitting, Cuff Size: Normal)   Pulse 52   Temp (!) 97.1 F (36.2 C) (Temporal)   Ht 4\' 11"  (1.499 m)   Wt 100 lb 12.8 oz (45.7 kg)   BMI 20.36 kg/m  Wt Readings from Last 3 Encounters:  05/23/20 100 lb 12.8 oz (45.7  kg)  04/05/20 100 lb (45.4 kg)  03/22/20 104 lb (47.2 kg)      Physical Exam Vitals reviewed.  Constitutional:      General: She is not in acute distress.    Appearance: She is not ill-appearing, toxic-appearing or diaphoretic.     Comments: Cachectic female in no acute distress.  HENT:     Head: Normocephalic and atraumatic.     Mouth/Throat:     Mouth: Mucous membranes are moist.     Pharynx: No oropharyngeal exudate.  Cardiovascular:     Rate and Rhythm: Normal rate and regular rhythm.  Pulmonary:     Effort: Pulmonary effort is normal. No respiratory distress.     Breath sounds: Normal  breath sounds. No stridor. No wheezing, rhonchi or rales.  Chest:     Chest wall: No tenderness.  Abdominal:     Palpations: Abdomen is soft.  Musculoskeletal:     Cervical back: Normal range of motion.  Skin:    General: Skin is warm and dry.     Capillary Refill: Capillary refill takes less than 2 seconds.  Neurological:     Mental Status: She is alert. Mental status is at baseline.  Psychiatric:        Mood and Affect: Mood normal.        Behavior: Behavior normal.       No results found for any visits on 05/23/20.  Assessment & Plan     1. Late onset Alzheimer's disease without behavioral disturbance (HCC) Progressive.  2. Mood swings May need to treat with Seroquel.  Will follow clinically. - Urine Culture; Future - POCT urinalysis dipstick  3. Urinary incontinence, unspecified type  - Urine Culture; Future - POCT urinalysis dipstick  4. Malnutrition of moderate degree (HCC)   5. Avitaminosis D    Return in about 2 weeks (around 06/06/2020).         Notnamed Scholz Cranford Mon, MD  [facility] [phone] (phone) [phone] (fax)  [place]

## 2020-05-23 ENCOUNTER — Ambulatory Visit: Payer: Medicare Other

## 2020-05-23 ENCOUNTER — Other Ambulatory Visit: Payer: Self-pay

## 2020-05-23 ENCOUNTER — Ambulatory Visit (INDEPENDENT_AMBULATORY_CARE_PROVIDER_SITE_OTHER): Payer: Medicare Other | Admitting: Family Medicine

## 2020-05-23 VITALS — BP 156/75 | HR 52 | Temp 97.1°F | Ht 59.0 in | Wt 100.8 lb

## 2020-05-23 DIAGNOSIS — Z9181 History of falling: Secondary | ICD-10-CM | POA: Diagnosis not present

## 2020-05-23 DIAGNOSIS — R2681 Unsteadiness on feet: Secondary | ICD-10-CM

## 2020-05-23 DIAGNOSIS — E559 Vitamin D deficiency, unspecified: Secondary | ICD-10-CM | POA: Diagnosis not present

## 2020-05-23 DIAGNOSIS — R32 Unspecified urinary incontinence: Secondary | ICD-10-CM

## 2020-05-23 DIAGNOSIS — G301 Alzheimer's disease with late onset: Secondary | ICD-10-CM | POA: Diagnosis not present

## 2020-05-23 DIAGNOSIS — R4586 Emotional lability: Secondary | ICD-10-CM | POA: Diagnosis not present

## 2020-05-23 DIAGNOSIS — F0280 Dementia in other diseases classified elsewhere without behavioral disturbance: Secondary | ICD-10-CM

## 2020-05-23 DIAGNOSIS — E440 Moderate protein-calorie malnutrition: Secondary | ICD-10-CM

## 2020-05-23 NOTE — Therapy (Signed)
[place] MAIN [facility] SERVICES [address] [place], [place], [address] Phone: [phone]   Fax:  [phone]  Physical Therapy Treatment  Patient Details  Name: Sarah [name] MRN: [id number] Date of Birth: 11-03-1940 Referring Provider (PT): Dr. Rosanna Randy   Encounter Date: 05/23/2020   PT End of Session - 05/23/20 1116    Visit Number 26    Number of Visits 49    Date for PT Re-Evaluation 06/27/20    Authorization Type eval: 10/31/19, PN: 06/27/20    PT Start Time 1110    PT Stop Time 1155    PT Time Calculation (min) 45 min    Equipment Utilized During Treatment Gait belt    Activity Tolerance Patient tolerated treatment well    Behavior During Therapy [facility] for tasks assessed/performed           Past Medical History:  Diagnosis Date  . Arthritis   . Dementia ([place])    ALZHEIMERS  . Ear anomaly    INNER EAR ISSUES  . Hyperlipidemia   . Hypertension     Past Surgical History:  Procedure Laterality Date  . APPENDECTOMY    . CATARACT EXTRACTION W/PHACO Right 03/09/2018   Procedure: CATARACT EXTRACTION PHACO AND INTRAOCULAR LENS PLACEMENT (IOC);  Surgeon: Birder Robson, MD;  Location: [facility] ORS;  Service: Ophthalmology;  Laterality: Right;  [place] 00:38.9 AP% 15.9 CDE 6.19 FLUID PACK LOT # [id number]  . CATARACT EXTRACTION W/PHACO Left 06/22/2018   Procedure: CATARACT EXTRACTION PHACO AND INTRAOCULAR LENS PLACEMENT (IOC);  Surgeon: Birder Robson, MD;  Location: [facility] ORS;  Service: Ophthalmology;  Laterality: Left;  [place] 00:50 AP% 16.5 CDE 8.25 Fluid pack lot # [id number]  . CESAREAN SECTION    . CHOLECYSTECTOMY    . CYSTOSCOPY WITH STENT PLACEMENT      There were no vitals filed for this visit.   Subjective Assessment - 05/23/20 1113    Subjective Pt/grandson report doing good today. Patient saw her PCP for with concerns for possible UTI today and they have to return a sample to the MD for testing. Patient has been rubbing knees  more frequently during the week, but doesn't report any knee pain today. Patient had one falls since last therapy session, when she was walking backwards. She stumbled yesterday, but did not fall. No specific questions upon arrival.    Patient is accompained by: Family member    Pertinent History Sarah [name] is a 79 y.o. female with a history of severe Alzheimer's dementia, hypertension who was brought to the ED due to a fall at home. She hit the back of her head resulting in bleeding.  She was treated with staples to close her wound and had a follow-up appointment with her PCP who ordered PT due to fall. Per grandson patient's Alzheimer's has progressed significantly over the last few months. He states that she likes to walk but is unstable. She has had 2 falls in the last 6 months. Per grandson at baseline she is AOx1 and is able to recognize her son and grandson with whom she lives. She does not participate in any regular exercise.    Limitations Walking    Patient Stated Goals Improve balance and safety    Currently in Pain? No/denies              TREATMENT   Ther-ex Verbal/visual/tactile cues provided, PT also demonstrated/performed exercises with pt to facilitate: NuStep L2 x 5 minutes for warm-up, cues needed to  continue pedaling; BLE leg press 30# x 20, 40# x 15; Seated clams with manual resistance from therapist x 15 BLE.   Neuromuscular Re-education: CGA and handheld assist provided forexercises Agility ladder stepping in order to encourage increased step length x 2.5 lengths, cued to take bigger steps with verbal and tactile cues; Balloon taps with therapist and student PT guarding, pt demonstrates improved performance today compared to previous sessions, as she was tapping balloon harder, cued to use R hand more frequently. Soccer kicks with grandson and student PT guarding, pt challenged to utilize alternating legs to kick ball; cued to kick with R foot more  frequently; Therapist danced with patientto tango musicin order to work on balance. Performedside stepping in both directions,forward/backward stepping, 180/360 degree turns, and cross-over stepsto both directions;   Pt educated throughout session about proper posture and technique with exercises. Improved exercise technique, movement at target joints, use of target muscles after min to mod verbal, visual, tactile cues.   Pt requires repeated redirection during session due to advanced dementia.She continues to demonstrate good motivation during sessions butrequirescreative activities for compliance/performance. Today, she struggled with attention and command follow as verbal and tactile cues were utilized more than usual. Counting out loud seemed to help with staying on task. She does not complain of any increase in her knee pain during session. Utilized dynamic activities for balloon taps, soccer kicks, and use of agility ladder. Once again utilized dancing for balance trainingtodayat end of session and pt demonstrates improved ability to perform cross-over stepping.Pt will benefit from PT services to address deficits in strength, balance, and mobility in order to return to full function at home.                  PT Short Term Goals - 04/04/20 1023      PT SHORT TERM GOAL #1   Title Pt will be participate with HEP at the direction of her caregiver in order to improve strength and balance in order to decrease fall risk and improve function at home.    Baseline 04/04/20: Pt resistant to HEP due to dementia    Time 6    Period Weeks    Status Partially Met    Target Date 05/16/20             PT Long Term Goals - 04/04/20 1023      PT LONG TERM GOAL #1   Title Pt will improve BERG by to at least 40/56 in order to demonstrate clinically significant improvement in balance.    Baseline 10/31/19: 30/56; 01/18/20: 36/56; 04/04/20: 43/56    Time 12    Period Weeks     Status Achieved    Target Date --      PT LONG TERM GOAL #2   Title Pt will decrease TUG to below 14 seconds/decrease in order to demonstrate decreased fall risk.    Baseline 10/31/19: 44.3s; 01/18/20: 32.8s; 04/04/20: 32.1s    Time 12    Period Weeks    Status Partially Met    Target Date 06/27/20      PT LONG TERM GOAL #3   Title Pt will increase 10MWT to at least 0.50 m/s in order to demonstrate clinically significant improvement in community ambulation.    Baseline 10/31/19: Self-selected: 57.4s =  0.17 m/s; 01/18/20: Self-selected: 29.4s =  0.34 m/s; 04/04/20: Self-selected: 25.6s =  0.39 m/s;    Time 12    Period Weeks    Status Partially  Met    Target Date 06/27/20      PT LONG TERM GOAL #4   Title Pt will be able to perform sit to stand from a regular height chair without UE support in order to demonstrate improved leg strength and safety    Baseline 10/31/19: Heavy UE dependence with sit to stand transfers; 01/18/20: Able to stand without UE reliance however prefers to place hands on arm rests; 04/04/20: Unchanged    Time 12    Period Weeks    Status Achieved                 Plan - 05/23/20 1207    Clinical Impression Statement Pt requires repeated redirection during session due to advanced dementia. She continues to demonstrate good motivation during sessions but requires creative activities for compliance/performance. Today, she struggled with attention and command follow as verbal and tactile cues were utilized more than usual. Counting out loud seemed to help with staying on task. She does not complain of any increase in her knee pain during session. Utilized dynamic activities for balloon taps, soccer kicks, and use of agility ladder. Once again utilized dancing for balance training today at end of session and pt demonstrates improved ability to perform cross-over stepping. Pt will benefit from PT services to address deficits in strength, balance, and mobility in order to return to  full function at home.    Personal Factors and Comorbidities Age;Comorbidity 3+    Comorbidities Alzheimer's, anxiety/depression, arthritis    Examination-Activity Limitations Bathing;Bed Mobility;Caring for Others;Carry;Dressing;Hygiene/Grooming;Lift;Locomotion Level;Squat;Stand;Transfers    Examination-Participation Restrictions Community Activity;Driving;Interpersonal Relationship;Laundry;Medication Management;Meal Prep;Personal Finances;Shop    Stability/Clinical Decision Making Unstable/Unpredictable    Rehab Potential Fair    PT Frequency 1x / week    PT Duration 12 weeks    PT Treatment/Interventions ADLs/Self Care Home Management;Aquatic Therapy;Biofeedback;Canalith Repostioning;Cryotherapy;Electrical Stimulation;Iontophoresis 41m/ml Dexamethasone;Moist Heat;Traction;Ultrasound;DME Instruction;Gait training;Stair training;Functional mobility training;Therapeutic activities;Therapeutic exercise;Balance training;Neuromuscular re-education;Cognitive remediation;Patient/family education;Manual techniques;Passive range of motion;Dry needling;Vestibular;Joint Manipulations    PT Next Visit Plan Progress balance and strengthening    PT Home Exercise Plan seated marches, seated clams, seated LAQ, feet together balance, slow marching, sit to stand with minimal UE dependendence    Consulted and Agree with Plan of Care Family member/caregiver    Family Member Consulted GAdolphus Birchwood          Patient will benefit from skilled therapeutic intervention in order to improve the following deficits and impairments:  Abnormal gait, Decreased balance, Decreased cognition, Decreased knowledge of precautions, Decreased safety awareness, Decreased strength, Difficulty walking  Visit Diagnosis: Unsteadiness on feet  History of falling     Problem List Patient Active Problem List   Diagnosis Date Noted  . Malnutrition of moderate degree ([place] 01/25/2020  . Chronic kidney disease due to hypertension  07/24/2019  . Mesenteric artery stenosis ([place] 03/24/2019  . Renal artery stenosis ([place] 01/26/2017  . Personal history of tobacco use, presenting hazards to health 06/23/2016  . Depressive disorder 05/29/2015  . Dementia ([place] 05/29/2015  . Allergic rhinitis 03/05/2015  . Weak pulse 03/05/2015  . Smokes tobacco daily 03/05/2015  . Generalized anxiety disorder 03/05/2015  . Acid reflux 03/05/2015  . HLD (hyperlipidemia) 03/05/2015  . Affective disorder, major 03/05/2015  . Bad memory 03/05/2015  . Arthritis, degenerative 03/05/2015  . OP (osteoporosis) 03/05/2015  . Renovascular hypertension 03/05/2015  . Scoliosis 03/05/2015  . Avitaminosis D 03/05/2015  . Weight loss 11/07/2014  . Family history of colon cancer 11/07/2014    This  entire session was performed under direct supervision and direction of a licensed Chiropractor . I have personally read, edited and approve of the note as written.   Phillips Grout PT, DPT, GCS  Noemi Chapel, SPT [place],Jason 05/23/2020, 5:12 PM  [place] MAIN [facility] SERVICES [address] [place], [place], [address] Phone: [phone]   Fax:  [phone]  Name: SUKI CROCKETT MRN: [id number] Date of Birth: [birth year]

## 2020-05-29 LAB — POCT URINALYSIS DIPSTICK
Bilirubin, UA: NEGATIVE
Glucose, UA: NEGATIVE
Ketones, UA: NEGATIVE
Leukocytes, UA: NEGATIVE
Nitrite, UA: NEGATIVE
Protein, UA: NEGATIVE
Spec Grav, UA: <=1.005 — AB (ref 1.010–1.025)
Urobilinogen, UA: 0.2 E.U./dL
pH, UA: 6 (ref 5.0–8.0)

## 2020-05-30 ENCOUNTER — Other Ambulatory Visit: Payer: Self-pay

## 2020-05-30 ENCOUNTER — Ambulatory Visit: Payer: Medicare Other | Attending: Family Medicine

## 2020-05-30 DIAGNOSIS — Z9181 History of falling: Secondary | ICD-10-CM | POA: Diagnosis not present

## 2020-05-30 DIAGNOSIS — R2681 Unsteadiness on feet: Secondary | ICD-10-CM | POA: Diagnosis not present

## 2020-05-30 NOTE — Therapy (Signed)
[place] MAIN [facility] SERVICES [address] [place], [place], [address] Phone: [phone]   Fax:  [phone]  Physical Therapy Treatment  Patient Details  Name: Sarah [name] MRN: [id number] Date of Birth: 24-Sep-1941 Referring Provider (PT): Dr. Rosanna Randy   Encounter Date: 05/30/2020   PT End of Session - 05/30/20 0948    Visit Number 27    Number of Visits 49    Date for PT Re-Evaluation 06/27/20    Authorization Type eval: 10/31/19, PN: 06/27/20    PT Start Time 0943    PT Stop Time 1020    PT Time Calculation (min) 37 min    Equipment Utilized During Treatment Gait belt    Activity Tolerance Patient tolerated treatment well    Behavior During Therapy [facility] for tasks assessed/performed           Past Medical History:  Diagnosis Date  . Arthritis   . Dementia ([place])    ALZHEIMERS  . Ear anomaly    INNER EAR ISSUES  . Hyperlipidemia   . Hypertension     Past Surgical History:  Procedure Laterality Date  . APPENDECTOMY    . CATARACT EXTRACTION W/PHACO Right 03/09/2018   Procedure: CATARACT EXTRACTION PHACO AND INTRAOCULAR LENS PLACEMENT (IOC);  Surgeon: Birder Robson, MD;  Location: [facility] ORS;  Service: Ophthalmology;  Laterality: Right;  [place] 00:38.9 AP% 15.9 CDE 6.19 FLUID PACK LOT # [id number]  . CATARACT EXTRACTION W/PHACO Left 06/22/2018   Procedure: CATARACT EXTRACTION PHACO AND INTRAOCULAR LENS PLACEMENT (IOC);  Surgeon: Birder Robson, MD;  Location: [facility] ORS;  Service: Ophthalmology;  Laterality: Left;  [place] 00:50 AP% 16.5 CDE 8.25 Fluid pack lot # [id number]  . CESAREAN SECTION    . CHOLECYSTECTOMY    . CYSTOSCOPY WITH STENT PLACEMENT      There were no vitals filed for this visit.   Subjective Assessment - 05/30/20 0946    Subjective Pt/grandson report doing good today. Urine sample came back negative. No falls or stumbles this week. No specific questions upon arrival.    Patient is accompained by: Family member     Pertinent History Sarah [name] is a 79 y.o. female with a history of severe Alzheimer's dementia, hypertension who was brought to the ED due to a fall at home. She hit the back of her head resulting in bleeding.  She was treated with staples to close her wound and had a follow-up appointment with her PCP who ordered PT due to fall. Per grandson patient's Alzheimer's has progressed significantly over the last few months. He states that she likes to walk but is unstable. She has had 2 falls in the last 6 months. Per grandson at baseline she is AOx1 and is able to recognize her son and grandson with whom she lives. She does not participate in any regular exercise.    Limitations Walking    Patient Stated Goals Improve balance and safety    Currently in Pain? No/denies               TREATMENT     Ther-ex  Verbal/visual/tactile cues provided, PT also demonstrated/performed exercises with pt to facilitate: NuStep L2 x 5 minutes for warm-up during history taking; BLE leg press 40# x 20, 30x 20, cued for slow descent.  Neuromuscular Re-education Gait with soccer kicks 2 x 60 feet, cued for bigger steps and going backwards; Throwing 1# ankle weights into empty bins x multiple bouts, cued to throw harder; Therapist  danced with patient to tango music in order to work on balance. Performed side stepping in both directions, forward/backward stepping, 180/360 degree turns, and cross-over steps to both directions;     Pt educated throughout session about proper posture and technique with exercises. Improved exercise technique, movement at target joints, use of target muscles after min to mod verbal, visual, tactile cues.    Patient arrived late, so session was abbreviated. Pt requires repeated redirection during session due to advanced dementia. She continues to demonstrate good motivation during sessions but requires creative activities for compliance/performance. Today, patient's attention and  command follow was better as exercises were more dynamic. Counting out loud seemed to help with staying on task during leg press. She does not complain of any increase in her knee pain during session. Utilized dynamic activities like soccer kicks while walking and throwing activities. Once again utilized dancing for balance training today at end of session and pt demonstrates improved ability to perform cross-over stepping. Pt will benefit from PT services to address deficits in strength, balance, and mobility in order to return to full function at home.                  PT Short Term Goals - 04/04/20 1023      PT SHORT TERM GOAL #1   Title Pt will be participate with HEP at the direction of her caregiver in order to improve strength and balance in order to decrease fall risk and improve function at home.    Baseline 04/04/20: Pt resistant to HEP due to dementia    Time 6    Period Weeks    Status Partially Met    Target Date 05/16/20             PT Long Term Goals - 04/04/20 1023      PT LONG TERM GOAL #1   Title Pt will improve BERG by to at least 40/56 in order to demonstrate clinically significant improvement in balance.    Baseline 10/31/19: 30/56; 01/18/20: 36/56; 04/04/20: 43/56    Time 12    Period Weeks    Status Achieved    Target Date --      PT LONG TERM GOAL #2   Title Pt will decrease TUG to below 14 seconds/decrease in order to demonstrate decreased fall risk.    Baseline 10/31/19: 44.3s; 01/18/20: 32.8s; 04/04/20: 32.1s    Time 12    Period Weeks    Status Partially Met    Target Date 06/27/20      PT LONG TERM GOAL #3   Title Pt will increase 10MWT to at least 0.50 m/s in order to demonstrate clinically significant improvement in community ambulation.    Baseline 10/31/19: Self-selected: 57.4s =  0.17 m/s; 01/18/20: Self-selected: 29.4s =  0.34 m/s; 04/04/20: Self-selected: 25.6s =  0.39 m/s;    Time 12    Period Weeks    Status Partially Met    Target Date  06/27/20      PT LONG TERM GOAL #4   Title Pt will be able to perform sit to stand from a regular height chair without UE support in order to demonstrate improved leg strength and safety    Baseline 10/31/19: Heavy UE dependence with sit to stand transfers; 01/18/20: Able to stand without UE reliance however prefers to place hands on arm rests; 04/04/20: Unchanged    Time 12    Period Weeks    Status Achieved  Plan - 05/30/20 0949    Clinical Impression Statement Patient arrived late, so session was abbreviated. Pt requires repeated redirection during session due to advanced dementia. She continues to demonstrate good motivation during sessions but requires creative activities for compliance/performance. Today, patient's attention and command follow was better as exercises were more dynamic. Counting out loud seemed to help with staying on task during leg press. She does not complain of any increase in her knee pain during session. Utilized dynamic activities like soccer kicks while walking and throwing activities. Once again utilized dancing for balance training today at end of session and pt demonstrates improved ability to perform cross-over stepping. Pt will benefit from PT services to address deficits in strength, balance, and mobility in order to return to full function at home.    Personal Factors and Comorbidities Age;Comorbidity 3+    Comorbidities Alzheimer's, anxiety/depression, arthritis    Examination-Activity Limitations Bathing;Bed Mobility;Caring for Others;Carry;Dressing;Hygiene/Grooming;Lift;Locomotion Level;Squat;Stand;Transfers    Examination-Participation Restrictions Community Activity;Driving;Interpersonal Relationship;Laundry;Medication Management;Meal Prep;Personal Finances;Shop    Stability/Clinical Decision Making Unstable/Unpredictable    Rehab Potential Fair    PT Frequency 1x / week    PT Duration 12 weeks    PT Treatment/Interventions ADLs/Self  Care Home Management;Aquatic Therapy;Biofeedback;Canalith Repostioning;Cryotherapy;Electrical Stimulation;Iontophoresis 85m/ml Dexamethasone;Moist Heat;Traction;Ultrasound;DME Instruction;Gait training;Stair training;Functional mobility training;Therapeutic activities;Therapeutic exercise;Balance training;Neuromuscular re-education;Cognitive remediation;Patient/family education;Manual techniques;Passive range of motion;Dry needling;Vestibular;Joint Manipulations    PT Next Visit Plan Progress balance and strengthening    PT Home Exercise Plan seated marches, seated clams, seated LAQ, feet together balance, slow marching, sit to stand with minimal UE dependendence    Consulted and Agree with Plan of Care Family member/caregiver    Family Member Consulted Sarah [name]          Patient will benefit from skilled therapeutic intervention in order to improve the following deficits and impairments:  Abnormal gait, Decreased balance, Decreased cognition, Decreased knowledge of precautions, Decreased safety awareness, Decreased strength, Difficulty walking  Visit Diagnosis: Unsteadiness on feet  History of falling     Problem List Patient Active Problem List   Diagnosis Date Noted  . Malnutrition of moderate degree ([place] 01/25/2020  . Chronic kidney disease due to hypertension 07/24/2019  . Mesenteric artery stenosis ([place] 03/24/2019  . Renal artery stenosis ([place] 01/26/2017  . Personal history of tobacco use, presenting hazards to health 06/23/2016  . Depressive disorder 05/29/2015  . Dementia ([place] 05/29/2015  . Allergic rhinitis 03/05/2015  . Weak pulse 03/05/2015  . Smokes tobacco daily 03/05/2015  . Generalized anxiety disorder 03/05/2015  . Acid reflux 03/05/2015  . HLD (hyperlipidemia) 03/05/2015  . Affective disorder, major 03/05/2015  . Bad memory 03/05/2015  . Arthritis, degenerative 03/05/2015  . OP (osteoporosis) 03/05/2015  . Renovascular hypertension 03/05/2015  .  Scoliosis 03/05/2015  . Avitaminosis D 03/05/2015  . Weight loss 11/07/2014  . Family history of colon cancer 11/07/2014    This entire session was performed under direct supervision and direction of a licensed therapist/therapist assistant . I have personally read, edited and approve of the note as written.    KNoemi [name] SPT JPhillips GroutPT, DPT, GCS  Sarah [name],Sarah [name] 05/30/2020, 5:07 PM  [facility] [address] [address]Phone: [phone]  Fax:  [phone] Name: Sarah LAROCQUEMRN: [id number] of Birth: 7April 24, 1942

## 2020-05-30 NOTE — Progress Notes (Deleted)
     Established patient visit   Patient: Sarah [name]   DOB: Sep 16, 1941   79 y.o. Female  MRN: [id number] Visit Date: 06/04/2020  Today's healthcare provider: Wilhemena Durie, MD   No chief complaint on file.  Subjective    HPI  Late onset Alzheimer's disease without behavioral disturbance ([place]) From 05/23/2020-Progressive.  Mood swings From 05/23/2020-May need to treat with Seroquel.  Will follow clinically.   {Show patient history (optional):23778::" "}   Medications: Outpatient Medications Prior to Visit  Medication Sig  . acetaminophen (TYLENOL) 500 MG tablet Take 500 mg by mouth at bedtime.   Marland Kitchen CALCIUM CARBONATE-VITAMIN D PO Take 1 tablet by mouth 4 (four) times a week.   . cholecalciferol (VITAMIN D) 1000 units tablet Take 1,000 Units by mouth 4 (four) times a week.   . Cobalamin Combinations (FOLTRATE) 500-1 MCG-MG TABS Take by mouth.  . cyanocobalamin 1000 MCG tablet Take 1,000 mcg by mouth 4 (four) times a week.   Marland Kitchen lisinopril (ZESTRIL) 20 MG tablet Take 1 Tablet By Mouth Daily.  . Probiotic CAPS Take 1 capsule by mouth daily.  . vitamin C (ASCORBIC ACID) 500 MG tablet Take 500 mg by mouth daily.  . vitamin E 400 UNIT capsule Take 400 Units by mouth 4 (four) times a week.    Facility-Administered Medications Prior to Visit  Medication Dose Route Frequency Provider  . lidocaine (PF) (XYLOCAINE) 1 % injection 2 mL  2 mL Intradermal Once Virginia Crews, MD  . lidocaine (PF) (XYLOCAINE) 1 % injection 2 mL  2 mL Intradermal Once Virginia Crews, MD  . lidocaine (PF) (XYLOCAINE) 1 % injection 2 mL  2 mL Intradermal Once Virginia Crews, MD  . lidocaine (PF) (XYLOCAINE) 1 % injection 2 mL  2 mL Intradermal Once Virginia Crews, MD  . methylPREDNISolone acetate (DEPO-MEDROL) injection 40 mg  40 mg Intramuscular Once Virginia Crews, MD  . methylPREDNISolone acetate (DEPO-MEDROL) injection 40 mg  40 mg Intramuscular Once Virginia Crews,  MD    Review of Systems  Constitutional: Negative for appetite change, chills, fatigue and fever.  Respiratory: Negative for chest tightness and shortness of breath.   Cardiovascular: Negative for chest pain and palpitations.  Gastrointestinal: Negative for abdominal pain, nausea and vomiting.  Neurological: Negative for dizziness and weakness.    {Heme  Chem  Endocrine  Serology  Results Review (optional):23779::" "}  Objective    There were no vitals taken for this visit. {Show previous vital signs (optional):23777::" "}  Physical Exam  ***  No results found for any visits on 06/04/20.  Assessment & Plan     ***  No follow-ups on file.      {provider attestation***:1}   Wilhemena Durie, MD  [facility] [phone] (phone) [phone] (fax)  [place]

## 2020-05-31 LAB — URINE CULTURE

## 2020-06-04 ENCOUNTER — Ambulatory Visit: Payer: Self-pay | Admitting: Family Medicine

## 2020-06-06 ENCOUNTER — Other Ambulatory Visit: Payer: Self-pay

## 2020-06-06 ENCOUNTER — Ambulatory Visit: Payer: Medicare Other

## 2020-06-06 DIAGNOSIS — R2681 Unsteadiness on feet: Secondary | ICD-10-CM | POA: Diagnosis not present

## 2020-06-06 DIAGNOSIS — Z9181 History of falling: Secondary | ICD-10-CM | POA: Diagnosis not present

## 2020-06-06 NOTE — Therapy (Signed)
[place] MAIN [facility] SERVICES [address] [place], [place], [address] Phone: [phone]   Fax:  [phone]  Physical Therapy Treatment  Patient Details  Name: Sarah [name] MRN: [id number] Date of Birth: 1941-10-21 Referring Provider (PT): Dr. Rosanna Randy   Encounter Date: 06/06/2020   PT End of Session - 06/06/20 0942    Visit Number 28    Number of Visits 49    Date for PT Re-Evaluation 06/27/20    Authorization Type eval: 10/31/19, PN: 06/27/20;    PT Start Time 4196    PT Stop Time 1015    PT Time Calculation (min) 38 min    Equipment Utilized During Treatment Gait belt    Activity Tolerance Patient tolerated treatment well    Behavior During Therapy [facility] for tasks assessed/performed           Past Medical History:  Diagnosis Date  . Arthritis   . Dementia ([place])    ALZHEIMERS  . Ear anomaly    INNER EAR ISSUES  . Hyperlipidemia   . Hypertension     Past Surgical History:  Procedure Laterality Date  . APPENDECTOMY    . CATARACT EXTRACTION W/PHACO Right 03/09/2018   Procedure: CATARACT EXTRACTION PHACO AND INTRAOCULAR LENS PLACEMENT (IOC);  Surgeon: Birder Robson, MD;  Location: [facility] ORS;  Service: Ophthalmology;  Laterality: Right;  [place] 00:38.9 AP% 15.9 CDE 6.19 FLUID PACK LOT # [id number]  . CATARACT EXTRACTION W/PHACO Left 06/22/2018   Procedure: CATARACT EXTRACTION PHACO AND INTRAOCULAR LENS PLACEMENT (IOC);  Surgeon: Birder Robson, MD;  Location: [facility] ORS;  Service: Ophthalmology;  Laterality: Left;  [place] 00:50 AP% 16.5 CDE 8.25 Fluid pack lot # [id number]  . CESAREAN SECTION    . CHOLECYSTECTOMY    . CYSTOSCOPY WITH STENT PLACEMENT      There were no vitals filed for this visit.   Subjective Assessment - 06/06/20 0940    Subjective Pt/grandson report doing good today. She slid off her bed yesterday, and wasn't able to get up. She was complaining of L knee pain this morning. Started implimenting exercises since  Sunday about 20 minutes a day. No specific questions upon arrival.    Patient is accompained by: Family member    Pertinent History Sarah [name] is a 79 y.o. female with a history of severe Alzheimer's dementia, hypertension who was brought to the ED due to a fall at home. She hit the back of her head resulting in bleeding.  She was treated with staples to close her wound and had a follow-up appointment with her PCP who ordered PT due to fall. Per grandson patient's Alzheimer's has progressed significantly over the last few months. He states that she likes to walk but is unstable. She has had 2 falls in the last 6 months. Per grandson at baseline she is AOx1 and is able to recognize her son and grandson with whom she lives. She does not participate in any regular exercise.    Limitations Walking    Patient Stated Goals Improve balance and safety    Currently in Pain? Yes    Pain Location Knee    Pain Orientation Left    Pain Descriptors / Indicators Aching    Pain Type Chronic pain              [facility] PT Assessment - 06/06/20 1006      Berg Balance Test   Sit to Stand Able to stand  independently using hands  Standing Unsupported Able to stand safely 2 minutes    Sitting with Back Unsupported but Feet Supported on Floor or Stool Able to sit safely and securely 2 minutes    Stand to Sit Controls descent by using hands    Transfers Able to transfer safely, definite need of hands    Standing Unsupported with Eyes Closed Able to stand 10 seconds safely    Standing Unsupported with Feet Together Able to place feet together independently and stand 1 minute safely    From Standing, Reach Forward with Outstretched Arm Can reach forward >12 cm safely (5")    From Standing Position, Pick up Object from Floor Able to pick up shoe safely and easily    From Standing Position, Turn to Look Behind Over each Shoulder Looks behind from both sides and weight shifts well    Turn 360 Degrees Able to turn  360 degrees safely but slowly    Standing Unsupported, Alternately Place Feet on Step/Stool Able to complete 4 steps without aid or supervision    Standing Unsupported, One Foot in Front Able to take small step independently and hold 30 seconds    Standing on One Leg Tries to lift leg/unable to hold 3 seconds but remains standing independently    Total Score 43             TREATMENT    Ther-Ex NuStep L2 x 5 minutes for warm-up during history taking;  BLE leg press 40# 2 x 20, cued for slow descent.    Neuromuscular Re-education Performed outcome measures and updated goals: TUG: 41.27 secs,  5TSTS: 17.28 secs, with PT supporting patient's hands 10MWT: 26.34 secs = 0.379 m/s BERG: 43/56 Therapist danced with patient to tango music in order to work on balance. Performed side stepping in both directions, forward/backward stepping, 180/360 degree turns, and cross-over steps to both directions;    Pt educated throughout session about proper posture and technique with exercises. Improved exercise technique, movement at target joints, use of target muscles after min to mod verbal, visual, tactile cues.     Patient arrived late, so session was abbreviated. Updated goals and outcome measures today, they were very similar to last progress note. Patient's cognitive awareness is a factor when performing the outcome measures as she is not understanding the instructions of the test. Her TUG increased about 9 seconds from the last progress note but has improved from the initial evaluation. She is demonstrating increased strength with the 5TSTS as she is no longer pushing off with her hands. Once again utilized dancing for balance training today at end of session and pt demonstrates improved ability to perform cross-over stepping. Pt will benefit from PT services to address deficits in strength, balance, and mobility in order to return to full function at home.           PT Short Term Goals  - 06/06/20 0944      PT SHORT TERM GOAL #1   Title Pt will be participate with HEP at the direction of her caregiver in order to improve strength and balance in order to decrease fall risk and improve function at home.    Baseline 04/04/20: Pt resistant to HEP due to dementia; 8/11: Been doing exercises since Sunday about 20 mins a day.    Time 6    Period Weeks    Status Partially Met    Target Date 05/16/20             PT  Long Term Goals - 06/06/20 1220      PT LONG TERM GOAL #1   Title Pt will improve BERG by to at least 40/56 in order to demonstrate clinically significant improvement in balance.    Baseline 10/31/19: 30/56; 01/18/20: 36/56; 04/04/20: 43/56; 06/06/20: 43/56    Time 12    Period Weeks    Status Achieved      PT LONG TERM GOAL #2   Title Pt will decrease TUG to below 14 seconds/decrease in order to demonstrate decreased fall risk.    Baseline 10/31/19: 44.3s; 01/18/20: 32.8s; 04/04/20: 32.1s; 06/06/20: 41.27 secs    Time 12    Period Weeks    Status Partially Met      PT LONG TERM GOAL #3   Title Pt will increase 10MWT to at least 0.50 m/s in order to demonstrate clinically significant improvement in community ambulation.    Baseline 10/31/19: Self-selected: 57.4s =  0.17 m/s; 01/18/20: Self-selected: 29.4s =  0.34 m/s; 04/04/20: Self-selected: 25.6s =  0.39 m/s; 06/06/20: 26.34s = 0.379 m/s;    Time 12    Period Weeks    Status Partially Met      PT LONG TERM GOAL #4   Title Pt will be able to perform sit to stand from a regular height chair without UE support in order to demonstrate improved leg strength and safety    Baseline 10/31/19: Heavy UE dependence with sit to stand transfers; 01/18/20: Able to stand without UE reliance however prefers to place hands on arm rests; 04/04/20: Unchanged; 06/06/20: 5TSTS: 17.28 secs with PT supportng patient's hands;    Time 12    Period Weeks    Status Achieved                 Plan - 06/06/20 0943    Clinical Impression Statement  Patient arrived late, so session was abbreviated. Updated goals and outcome measures today, they were very similar to last progress note. Patient's cognitive awareness is a factor when performing the outcome measures as she is not understanding the instructions of the test. Her TUG increased about 9 seconds from the last progress note but has improved from the initial evaluation. She is demonstrating increased strength with the 5TSTS as she is no longer pushing off with her hands. Once again utilized dancing for balance training today at end of session and pt demonstrates improved ability to perform cross-over stepping. Pt will benefit from PT services to address deficits in strength, balance, and mobility in order to return to full function at home.    Personal Factors and Comorbidities Age;Comorbidity 3+    Comorbidities Alzheimer's, anxiety/depression, arthritis    Examination-Activity Limitations Bathing;Bed Mobility;Caring for Others;Carry;Dressing;Hygiene/Grooming;Lift;Locomotion Level;Squat;Stand;Transfers    Examination-Participation Restrictions Community Activity;Driving;Interpersonal Relationship;Laundry;Medication Management;Meal Prep;Personal Finances;Shop    Stability/Clinical Decision Making Unstable/Unpredictable    Rehab Potential Fair    PT Frequency 1x / week    PT Duration 12 weeks    PT Treatment/Interventions ADLs/Self Care Home Management;Aquatic Therapy;Biofeedback;Canalith Repostioning;Cryotherapy;Electrical Stimulation;Iontophoresis 45m/ml Dexamethasone;Moist Heat;Traction;Ultrasound;DME Instruction;Gait training;Stair training;Functional mobility training;Therapeutic activities;Therapeutic exercise;Balance training;Neuromuscular re-education;Cognitive remediation;Patient/family education;Manual techniques;Passive range of motion;Dry needling;Vestibular;Joint Manipulations    PT Next Visit Plan Progress balance and strengthening    PT Home Exercise Plan seated marches, seated  clams, seated LAQ, feet together balance, slow marching, sit to stand with minimal UE dependendence    Consulted and Agree with Plan of Care Family member/caregiver    Family Member Consulted [place]  Patient will benefit from skilled therapeutic intervention in order to improve the following deficits and impairments:  Abnormal gait, Decreased balance, Decreased cognition, Decreased knowledge of precautions, Decreased safety awareness, Decreased strength, Difficulty walking  Visit Diagnosis: Unsteadiness on feet  History of falling     Problem List Patient Active Problem List   Diagnosis Date Noted  . Malnutrition of moderate degree ([place]) 01/25/2020  . Chronic kidney disease due to hypertension 07/24/2019  . Mesenteric artery stenosis ([place]) 03/24/2019  . Renal artery stenosis ([place]) 01/26/2017  . Personal history of tobacco use, presenting hazards to health 06/23/2016  . Depressive disorder 05/29/2015  . Dementia ([place]) 05/29/2015  . Allergic rhinitis 03/05/2015  . Weak pulse 03/05/2015  . Smokes tobacco daily 03/05/2015  . Generalized anxiety disorder 03/05/2015  . Acid reflux 03/05/2015  . HLD (hyperlipidemia) 03/05/2015  . Affective disorder, major 03/05/2015  . Bad memory 03/05/2015  . Arthritis, degenerative 03/05/2015  . OP (osteoporosis) 03/05/2015  . Renovascular hypertension 03/05/2015  . Scoliosis 03/05/2015  . Avitaminosis D 03/05/2015  . Weight loss 11/07/2014  . Family history of colon cancer 11/07/2014    This entire session was performed under direct supervision and direction of a licensed therapist/therapist assistant . I have personally read, edited and approve of the note as written.   Noemi Chapel, SPT Phillips Grout PT, DPT, GCS  Huprich,Jason 06/06/2020, 4:34 PM  [place] MAIN [facility] SERVICES [address] [place], [place], [address] Phone: [phone]   Fax:  [phone]  Name: Sarah [name] MRN: [id number] Date of Birth: 1941-08-11

## 2020-06-13 ENCOUNTER — Ambulatory Visit: Payer: Medicare Other

## 2020-06-13 ENCOUNTER — Other Ambulatory Visit: Payer: Self-pay

## 2020-06-13 DIAGNOSIS — Z9181 History of falling: Secondary | ICD-10-CM

## 2020-06-13 DIAGNOSIS — R2681 Unsteadiness on feet: Secondary | ICD-10-CM

## 2020-06-13 NOTE — Therapy (Signed)
[place] MAIN [facility] SERVICES [address] [place], [place], [address] Phone: [phone]   Fax:  [phone]  Physical Therapy Treatment  Patient Details  Name: Sarah [name] MRN: [id number] Date of Birth: 01/17/1941 Referring Provider (PT): Dr. Rosanna Randy   Encounter Date: 06/13/2020   PT End of Session - 06/13/20 1415    Visit Number 29    Number of Visits 49    Date for PT Re-Evaluation 06/27/20    Authorization Type eval: 10/31/19, PN: 06/27/20;    PT Start Time 0941    PT Stop Time 1017    PT Time Calculation (min) 36 min    Equipment Utilized During Treatment Gait belt    Activity Tolerance Patient tolerated treatment well    Behavior During Therapy [facility] for tasks assessed/performed           Past Medical History:  Diagnosis Date  . Arthritis   . Dementia ([place])    ALZHEIMERS  . Ear anomaly    INNER EAR ISSUES  . Hyperlipidemia   . Hypertension     Past Surgical History:  Procedure Laterality Date  . APPENDECTOMY    . CATARACT EXTRACTION W/PHACO Right 03/09/2018   Procedure: CATARACT EXTRACTION PHACO AND INTRAOCULAR LENS PLACEMENT (IOC);  Surgeon: Birder Robson, MD;  Location: [facility] ORS;  Service: Ophthalmology;  Laterality: Right;  [place] 00:38.9 AP% 15.9 CDE 6.19 FLUID PACK LOT # [id number] H  . CATARACT EXTRACTION W/PHACO Left 06/22/2018   Procedure: CATARACT EXTRACTION PHACO AND INTRAOCULAR LENS PLACEMENT (IOC);  Surgeon: Birder Robson, MD;  Location: [facility] ORS;  Service: Ophthalmology;  Laterality: Left;  [place] 00:50 AP% 16.5 CDE 8.25 Fluid pack lot # [id number] H  . CESAREAN SECTION    . CHOLECYSTECTOMY    . CYSTOSCOPY WITH STENT PLACEMENT      There were no vitals filed for this visit.   Subjective Assessment - 06/13/20 0946    Subjective Pt/grandson report doing good today. She had no falls/stumbles this week. She was complaining of L knee pain this morning. She tried wearing new shoes with more support but they were  heavy and she didn't like it. They have been implementing 15-20 minutes of exercises. No specific questions upon arrival.    Patient is accompained by: Family member    Pertinent History Sarah [name] is a 79 y.o. female with a history of severe Alzheimer's dementia, hypertension who was brought to the ED due to a fall at home. She hit the back of her head resulting in bleeding.  She was treated with staples to close her wound and had a follow-up appointment with her PCP who ordered PT due to fall. Per grandson patient's Alzheimer's has progressed significantly over the last few months. He states that she likes to walk but is unstable. She has had 2 falls in the last 6 months. Per grandson at baseline she is AOx1 and is able to recognize her son and grandson with whom she lives. She does not participate in any regular exercise.    Limitations Walking    Patient Stated Goals Improve balance and safety    Currently in Pain? Yes    Pain Location Knee    Pain Orientation Left    Pain Descriptors / Indicators Aching    Pain Type Chronic pain            TREATMENT  Ther-Ex  Verbal/visual/tactile cues provided, PT also demonstrated/performed exercises with pt to facilitate:  NuStep L2 x 4  minutes for warm-up during history taking;   BLE leg press 40# 2 x 20, cued for slow descent.    Neuromuscular Re-education  Standing soccer kicks 2 x 60 feet, cued for kicking harder and using both LEs; 1kg ball tosses to therapist with SPT guarding, cued to take a step and throw ball harder, 1kg ball hard for patient to catch, so SPT catches ball from therapist and gives to patient to throw; Bowling with1 kg ball, patient cued to take a step and roll the ball; Ascending and descending stairs x 1, with BUE support;  Therapist danced with patient to tango music in order to work on balance. Performed side stepping in both directions, forward/backward stepping, 180/360 degree turns, and cross-over steps to both  directions;    Pt educated throughout session about proper posture and technique with exercises. Improved exercise technique, movement at target joints, use of target muscles after min to mod verbal, visual, tactile cues.    Patient arrived late, so session was abbreviated. Pt requires repeated redirection during session due to advanced dementia. She continues to demonstrate good motivation during sessions but requires creative activities for compliance/performance. Patient seems more unsteady today as she was reaching for UE support and moving slightly slower than previous sessions. She does not complain of any increase in her knee pain during session. Utilized dynamic activities like soccer kicks and throwing activities. Once again utilized dancing for balance training today at end of session and pt demonstrates improved ability to perform cross-over stepping. Will update goals next session and prepare for discharge. Pt will benefit from PT services to address deficits in strength, balance, and mobility in order to return to full function at home.        PT Short Term Goals - 06/06/20 0944      PT SHORT TERM GOAL #1   Title Pt will be participate with HEP at the direction of her caregiver in order to improve strength and balance in order to decrease fall risk and improve function at home.    Baseline 04/04/20: Pt resistant to HEP due to dementia; 8/11: Been doing exercises since Sunday about 20 mins a day.    Time 6    Period Weeks    Status Partially Met    Target Date 05/16/20             PT Long Term Goals - 06/06/20 1220      PT LONG TERM GOAL #1   Title Pt will improve BERG by to at least 40/56 in order to demonstrate clinically significant improvement in balance.    Baseline 10/31/19: 30/56; 01/18/20: 36/56; 04/04/20: 43/56; 06/06/20: 43/56    Time 12    Period Weeks    Status Achieved      PT LONG TERM GOAL #2   Title Pt will decrease TUG to below 14 seconds/decrease in order to  demonstrate decreased fall risk.    Baseline 10/31/19: 44.3s; 01/18/20: 32.8s; 04/04/20: 32.1s; 06/06/20: 41.27 secs    Time 12    Period Weeks    Status Partially Met      PT LONG TERM GOAL #3   Title Pt will increase 10MWT to at least 0.50 m/s in order to demonstrate clinically significant improvement in community ambulation.    Baseline 10/31/19: Self-selected: 57.4s =  0.17 m/s; 01/18/20: Self-selected: 29.4s =  0.34 m/s; 04/04/20: Self-selected: 25.6s =  0.39 m/s; 06/06/20: 26.34s = 0.379 m/s;    Time 12    Period  Weeks    Status Partially Met      PT LONG TERM GOAL #4   Title Pt will be able to perform sit to stand from a regular height chair without UE support in order to demonstrate improved leg strength and safety    Baseline 10/31/19: Heavy UE dependence with sit to stand transfers; 01/18/20: Able to stand without UE reliance however prefers to place hands on arm rests; 04/04/20: Unchanged; 06/06/20: 5TSTS: 17.28 secs with PT supportng patient's hands;    Time 12    Period Weeks    Status Achieved                 Plan - 06/13/20 1415    Clinical Impression Statement Patient arrived late, so session was abbreviated. Pt requires repeated redirection during session due to advanced dementia. She continues to demonstrate good motivation during sessions but requires creative activities for compliance/performance. Patient seems more unsteady today as she was reaching for UE support and moving slightly slower than previous sessions. She does not complain of any increase in her knee pain during session. Utilized dynamic activities like soccer kicks and throwing activities. Once again utilized dancing for balance training today at end of session and pt demonstrates improved ability to perform cross-over stepping. Will update goals next session and consider discharge. Pt will benefit from PT services to address deficits in strength, balance, and mobility in order to return to full function at home.     Personal Factors and Comorbidities Age;Comorbidity 3+    Comorbidities Alzheimer's, anxiety/depression, arthritis    Examination-Activity Limitations Bathing;Bed Mobility;Caring for Others;Carry;Dressing;Hygiene/Grooming;Lift;Locomotion Level;Squat;Stand;Transfers    Examination-Participation Restrictions Community Activity;Driving;Interpersonal Relationship;Laundry;Medication Management;Meal Prep;Personal Finances;Shop    Stability/Clinical Decision Making Unstable/Unpredictable    Rehab Potential Fair    PT Frequency 1x / week    PT Duration 12 weeks    PT Treatment/Interventions ADLs/Self Care Home Management;Aquatic Therapy;Biofeedback;Canalith Repostioning;Cryotherapy;Electrical Stimulation;Iontophoresis 54m/ml Dexamethasone;Moist Heat;Traction;Ultrasound;DME Instruction;Gait training;Stair training;Functional mobility training;Therapeutic activities;Therapeutic exercise;Balance training;Neuromuscular re-education;Cognitive remediation;Patient/family education;Manual techniques;Passive range of motion;Dry needling;Vestibular;Joint Manipulations    PT Next Visit Plan Update goals next session, progress note    PT Home Exercise Plan seated marches, seated clams, seated LAQ, feet together balance, slow marching, sit to stand with minimal UE dependendence    Consulted and Agree with Plan of Care Family member/caregiver    Family Member Consulted [place]          Patient will benefit from skilled therapeutic intervention in order to improve the following deficits and impairments:  Abnormal gait, Decreased balance, Decreased cognition, Decreased knowledge of precautions, Decreased safety awareness, Decreased strength, Difficulty walking  Visit Diagnosis: Unsteadiness on feet  History of falling     Problem List Patient Active Problem List   Diagnosis Date Noted  . Malnutrition of moderate degree ([place] 01/25/2020  . Chronic kidney disease due to hypertension 07/24/2019  .  M[place]enteric artery stenosis ([place] 03/24/2019  . [place] artery [place] ([place] 01/26/2017  . Personal history of tobacco use, presenting hazards to health 06/23/2016  . Depressive disorder 05/29/2015  . Dementia ([place] 05/29/2015  . Allergic rhinitis 03/05/2015  . Weak pulse 03/05/2015  . Smokes tobacco daily 03/05/2015  . Generalized anxiety disorder 03/05/2015  . Acid reflux 03/05/2015  . HLD (hyperlipidemia) 03/05/2015  . Affective disorder, major 03/05/2015  . Bad memory 03/05/2015  . Arthritis, degenerative 03/05/2015  . OP (osteoporosis) 03/05/2015  . Renovascular hypertension 03/05/2015  . Scoliosis 03/05/2015  . Avitaminosis D 03/05/2015  . Weight loss  11/07/2014  . Family history of colon cancer 11/07/2014    This entire session was performed under direct supervision and direction of a licensed therapist/therapist assistant . I have personally read, edited and approve of the note as written.   Noemi Chapel, SPT Phillips Grout PT, DPT, GCS  Huprich,Jason 06/14/2020, 12:00 PM  [place] MAIN [facility] SERVICES [address] [place], [place], [address] Phone: [phone]   Fax:  [phone]  Name: Sarah [name] MRN: [id number] Date of Birth: 07/17/1941

## 2020-06-20 ENCOUNTER — Other Ambulatory Visit: Payer: Self-pay

## 2020-06-20 ENCOUNTER — Ambulatory Visit: Payer: Medicare Other

## 2020-06-20 DIAGNOSIS — R2681 Unsteadiness on feet: Secondary | ICD-10-CM | POA: Diagnosis not present

## 2020-06-20 DIAGNOSIS — Z9181 History of falling: Secondary | ICD-10-CM | POA: Diagnosis not present

## 2020-06-20 NOTE — Therapy (Signed)
[place] MAIN [facility] SERVICES [address] [place], [place], [address] Phone: [phone]   Fax:  [phone]  Physical Therapy Treatment / Progress Note Dates of reporting period  04/24/20  to  06/20/20   Patient Details  Name: Sarah [name] MRN: [id number] Date of Birth: [birth year] Referring Provider (PT): Dr. Rosanna Randy   Encounter Date: 06/20/2020   PT End of Session - 06/20/20 1040    Visit Number 30    Number of Visits 60    Date for PT Re-Evaluation 06/27/20    Authorization Type eval: 10/31/19, PN: 06/20/20; Re-cert: 12/05/50    PT Start Time 0933    PT Stop Time 1015    PT Time Calculation (min) 42 min    Equipment Utilized During Treatment Gait belt    Activity Tolerance Patient tolerated treatment well    Behavior During Therapy WFL for tasks assessed/performed           Past Medical History:  Diagnosis Date  . Arthritis   . Dementia ([place])    ALZHEIMERS  . Ear anomaly    INNER EAR ISSUES  . Hyperlipidemia   . Hypertension     Past Surgical History:  Procedure Laterality Date  . APPENDECTOMY    . CATARACT EXTRACTION W/PHACO Right 03/09/2018   Procedure: CATARACT EXTRACTION PHACO AND INTRAOCULAR LENS PLACEMENT (IOC);  Surgeon: Birder Robson, MD;  Location: [facility] ORS;  Service: Ophthalmology;  Laterality: Right;  [place] 00:38.9 AP% 15.9 CDE 6.19 FLUID PACK LOT # [id number]  . CATARACT EXTRACTION W/PHACO Left 06/22/2018   Procedure: CATARACT EXTRACTION PHACO AND INTRAOCULAR LENS PLACEMENT (IOC);  Surgeon: Birder Robson, MD;  Location: [facility] ORS;  Service: Ophthalmology;  Laterality: Left;  [place] 00:50 AP% 16.5 CDE 8.25 Fluid pack lot # [id number]  . CESAREAN SECTION    . CHOLECYSTECTOMY    . CYSTOSCOPY WITH STENT PLACEMENT      There were no vitals filed for this visit.   Subjective Assessment - 06/20/20 0938    Subjective Pt/grandson report doing good today. She had no falls/stumbles this week. She was complaining of L knee  pain this morning. They continue to be implementing 15-20 minutes of exercises. No specific questions upon arrival.    Patient is accompained by: Family member    Pertinent History Sarah [name] is a 79 y.o. female with a history of severe Alzheimer's dementia, hypertension who was brought to the ED due to a fall at home. She hit the back of her head resulting in bleeding.  She was treated with staples to close her wound and had a follow-up appointment with her PCP who ordered PT due to fall. Per grandson patient's Alzheimer's has progressed significantly over the last few months. He states that she likes to walk but is unstable. She has had 2 falls in the last 6 months. Per grandson at baseline she is AOx1 and is able to recognize her son and grandson with whom she lives. She does not participate in any regular exercise.    Limitations Walking    Patient Stated Goals Improve balance and safety    Currently in Pain? Yes    Pain Location Knee    Pain Orientation Left    Pain Descriptors / Indicators Aching    Pain Type Chronic pain           TREATMENT   Ther-Ex  Verbal/visual/tactile cues provided, PT also demonstrated/performed exercises with pt to facilitate:  NuStep L2 x 4  minutes for warm-up during history taking;   BLE leg press 40# 3 x 20, cued for slow descent, increase weight next time; 2MWT: Walked 140 feet    Neuromuscular Re-education  1kg ball tosses to therapist with SPT guarding, cued to take a step and throw ball harder, x10 overhand throws each hand, 10 overhand throws each hand; 1kg ball hard for patient to catch, so SPT catches ball from therapist and gives to patient to throw;  Balloon taps with grandson and student PT guarding, pt encouraged to hit balloon harder and farther and also change hands;   Pt educated throughout session about proper posture and technique with exercises. Improved exercise technique, movement at target joints, use of target muscles after min to  mod verbal, visual, tactile cues.     Patient demonstrates excellent motivation today. Patient arrived late, so session was abbreviated. Goals were updated recently, so not updated again today. Patient's cognitive awareness is a factor when performing the outcome measures as she is not understanding the instructions of the test. Her TUG increased about 9 seconds from the last progress note but has improved from the initial evaluation. She is demonstrating increased strength with the 5TSTS as she is no longer pushing off with her hands. Added goals today, a 2MWT goal and leg press weight goal to improve walking endurance and increase LE strength. Pt requires repeated redirection during session due to advanced dementia. She requires creative activities for compliance/performance so utilized dynamic activities like balloon taps and throwing activities. Patient has difficulty walking forwards and backwards with big steps and moving outside of her base of support. Pt encouraged to continue HEP and follow-up as scheduled. Pt will benefit from PT services to address deficits in strength, balance, and mobility in order to return to full function at home.    [facility] PT Assessment - 06/20/20 1047      6 Minute Walk- Baseline   6 Minute Walk- Baseline yes    BP (mmHg) 125/73    HR (bpm) 64    02 Sat (%RA) 100 %      6 Minute walk- Post Test   6 Minute Walk Post Test yes    BP (mmHg) 148/115    HR (bpm) 70    02 Sat (%RA) 97 %      6 minute walk test results    Aerobic Endurance Distance Walked 140    Endurance additional comments 2MWT             PT Short Term Goals - 06/20/20 1035      PT SHORT TERM GOAL #1   Title Pt will be participate with HEP at the direction of her caregiver in order to improve strength and balance in order to decrease fall risk and improve function at home.    Baseline 04/04/20: Pt resistant to HEP due to dementia; 8/11: Been doing exercises since Sunday about 20 mins a day.     Time 6    Period Weeks    Status Partially Met    Target Date 05/16/20             PT Long Term Goals - 06/20/20 1038      PT LONG TERM GOAL #1   Title Pt will improve BERG by to at least 40/56 in order to demonstrate clinically significant improvement in balance.    Baseline 10/31/19: 30/56; 01/18/20: 36/56; 04/04/20: 43/56; 06/06/20: 43/56    Time 12    Period Weeks  Status Achieved      PT LONG TERM GOAL #2   Title Pt will decrease TUG to below 14 seconds/decrease in order to demonstrate decreased fall risk.    Baseline 10/31/19: 44.3s; 01/18/20: 32.8s; 04/04/20: 32.1s; 06/06/20: 41.27 secs    Time 12    Period Weeks    Status Partially Met      PT LONG TERM GOAL #3   Title Pt will increase 10MWT to at least 0.50 m/s in order to demonstrate clinically significant improvement in community ambulation.    Baseline 10/31/19: Self-selected: 57.4s =  0.17 m/s; 01/18/20: Self-selected: 29.4s =  0.34 m/s; 04/04/20: Self-selected: 25.6s =  0.39 m/s; 06/06/20: 26.34s = 0.379 m/s;    Time 12    Period Weeks    Status Partially Met      PT LONG TERM GOAL #4   Title Pt will be able to perform sit to stand from a regular height chair without UE support in order to demonstrate improved leg strength and safety    Baseline 10/31/19: Heavy UE dependence with sit to stand transfers; 01/18/20: Able to stand without UE reliance however prefers to place hands on arm rests; 04/04/20: Unchanged; 06/06/20: 5TSTS: 17.28 secs with PT supportng patient's hands;    Time 12    Period Weeks    Status Achieved      PT LONG TERM GOAL #5   Title Pt will be able to perform 55# x 10 reps in order to increase strength of LE and improve function at home.    Baseline 8/25: 40# 3 x 20 reps    Time 12    Period Weeks    Status New    Target Date 06/27/20      Additional Long Term Goals   Additional Long Term Goals Yes      PT LONG TERM GOAL #6   Title Pt will increase 2MWT by at least 41m(40 ft) in order to demonstrate  clinically significant improvement in cardiopulmonary endurance and community ambulation.    Baseline 8/25: 140 feet    Time 12    Period Weeks    Status New    Target Date 06/27/20                 Plan - 06/20/20 1042    Clinical Impression Statement Patient demonstrates excellent motivation today. Patient arrived late, so session was abbreviated. Goals were updated recently, so not updated again today. Patient's cognitive awareness is a factor when performing the outcome measures as she is not understanding the instructions of the test. Her TUG increased about 9 seconds from the last progress note but has improved from the initial evaluation. She is demonstrating increased strength with the 5TSTS as she is no longer pushing off with her hands. Added goals today, a 2MWT goal and leg press weight goal to improve walking endurance and increase LE strength. Pt requires repeated redirection during session due to advanced dementia. She requires creative activities for compliance/performance so utilized dynamic activities like balloon taps and throwing activities. Patient has difficulty walking forwards and backwards with big steps and moving outside of her base of support. Pt encouraged to continue HEP and follow-up as scheduled. Pt will benefit from PT services to address deficits in strength, balance, and mobility in order to return to full function at home.    Personal Factors and Comorbidities Age;Comorbidity 3+    Comorbidities Alzheimer's, anxiety/depression, arthritis    Examination-Activity Limitations Bathing;Bed Mobility;Caring for  Others;Carry;Dressing;Hygiene/Grooming;Lift;Locomotion Level;Squat;Stand;Transfers    Examination-Participation Restrictions Community Activity;Driving;Interpersonal Relationship;Laundry;Medication Management;Meal Prep;Personal Finances;Shop    Stability/Clinical Decision Making Unstable/Unpredictable    Rehab Potential Fair    PT Frequency 1x / week    PT  Duration 12 weeks    PT Treatment/Interventions ADLs/Self Care Home Management;Aquatic Therapy;Biofeedback;Canalith Repostioning;Cryotherapy;Electrical Stimulation;Iontophoresis 68m/ml Dexamethasone;Moist Heat;Traction;Ultrasound;DME Instruction;Gait training;Stair training;Functional mobility training;Therapeutic activities;Therapeutic exercise;Balance training;Neuromuscular re-education;Cognitive remediation;Patient/family education;Manual techniques;Passive range of motion;Dry needling;Vestibular;Joint Manipulations    PT Next Visit Plan Update goals next session, progress note    PT Home Exercise Plan seated marches, seated clams, seated LAQ, feet together balance, slow marching, sit to stand with minimal UE dependendence    Consulted and Agree with Plan of Care Family member/caregiver    Family Member Consulted GAdolphus Birchwood          Patient will benefit from skilled therapeutic intervention in order to improve the following deficits and impairments:  Abnormal gait, Decreased balance, Decreased cognition, Decreased knowledge of precautions, Decreased safety awareness, Decreased strength, Difficulty walking  Visit Diagnosis: Unsteadiness on feet     Problem List Patient Active Problem List   Diagnosis Date Noted  . Malnutrition of moderate degree ([place] 01/25/2020  . Chronic kidney disease due to hypertension 07/24/2019  . Mesenteric artery stenosis ([place] 03/24/2019  . Renal artery stenosis ([place] 01/26/2017  . Personal history of tobacco use, presenting hazards to health 06/23/2016  . Depressive disorder 05/29/2015  . Dementia ([place] 05/29/2015  . Allergic rhinitis 03/05/2015  . Weak pulse 03/05/2015  . Smokes tobacco daily 03/05/2015  . Generalized anxiety disorder 03/05/2015  . Acid reflux 03/05/2015  . HLD (hyperlipidemia) 03/05/2015  . Affective disorder, major 03/05/2015  . Bad memory 03/05/2015  . Arthritis, degenerative 03/05/2015  . OP (osteoporosis) 03/05/2015  .  Renovascular hypertension 03/05/2015  . Scoliosis 03/05/2015  . Avitaminosis D 03/05/2015  . Weight loss 11/07/2014  . Family history of colon cancer 11/07/2014   KNoemi Chapel SPT KBernita Raisin8/25/2021, 10:57 AM  CPaceMAIN R[facility] LLCSERVICES [address]RE[address] [place] [address]Phone: [phone]  Fax:  [phone] Name: MMAELEY MATTONMRN: [id number] of Birth: 710/25/1942

## 2020-06-25 NOTE — Progress Notes (Signed)
Subjective:   Sarah [name] is a 79 y.o. female who presents for Medicare Annual (Subsequent) preventive examination.  I connected with Adriyanna Christians (grandson/ok per DPR) today by telephone and verified that I am speaking with the correct person using two identifiers. Location patient: home Location provider: work Persons participating in the virtual visit: patient, provider.   I discussed the limitations, risks, security and privacy concerns of performing an evaluation and management service by telephone and the availability of in person appointments. I also discussed with the patient that there may be a patient responsible charge related to this service. The patient expressed understanding and verbally consented to this telephonic visit.    Interactive audio and video telecommunications were attempted between this provider and patient, however failed, due to patient having technical difficulties OR patient did not have access to video capability.  We continued and completed visit with audio only.   Review of Systems    N/A  Cardiac Risk Factors include: advanced age (>35men, >70 women);dyslipidemia;hypertension     Objective:    There were no vitals filed for this visit. There is no height or weight on file to calculate BMI.  Advanced Directives 06/26/2020 01/02/2020 09/13/2019 06/23/2019 06/22/2018 06/18/2018 05/30/2017  Does Patient Have a Medical Advance Directive? Yes Yes Yes Yes No No No  Type of Advance Directive Living will Living will Living will Living will - - -  Does patient want to make changes to medical advance directive? - - No - Patient declined - - - -  Would patient like information on creating a medical advance directive? - - - - No - Patient declined No - Patient declined No - Patient declined    Current Medications (verified) Outpatient Encounter Medications as of 06/26/2020  Medication Sig  . acetaminophen (TYLENOL) 500 MG tablet Take 500 mg by mouth at bedtime.     Marland Kitchen CALCIUM CARBONATE-VITAMIN D PO Take 1 tablet by mouth 4 (four) times a week.   . cholecalciferol (VITAMIN D) 1000 units tablet Take 1,000 Units by mouth 4 (four) times a week.   . cyanocobalamin 1000 MCG tablet Take 1,000 mcg by mouth 4 (four) times a week.   Marland Kitchen lisinopril (ZESTRIL) 20 MG tablet Take 1 Tablet By Mouth Daily.  . Probiotic CAPS Take 1 capsule by mouth daily.  . vitamin C (ASCORBIC ACID) 500 MG tablet Take 500 mg by mouth daily.  . vitamin E 400 UNIT capsule Take 400 Units by mouth 4 (four) times a week.   . Cobalamin Combinations (FOLTRATE) 500-1 MCG-MG TABS Take by mouth. (Patient not taking: Reported on 06/26/2020)   Facility-Administered Encounter Medications as of 06/26/2020  Medication  . lidocaine (PF) (XYLOCAINE) 1 % injection 2 mL  . lidocaine (PF) (XYLOCAINE) 1 % injection 2 mL  . lidocaine (PF) (XYLOCAINE) 1 % injection 2 mL  . lidocaine (PF) (XYLOCAINE) 1 % injection 2 mL  . methylPREDNISolone acetate (DEPO-MEDROL) injection 40 mg  . methylPREDNISolone acetate (DEPO-MEDROL) injection 40 mg    Allergies (verified) Penicillins   History: Past Medical History:  Diagnosis Date  . Arthritis   . Dementia ([place])    ALZHEIMERS  . Ear anomaly    INNER EAR ISSUES  . Hyperlipidemia   . Hypertension    Past Surgical History:  Procedure Laterality Date  . APPENDECTOMY    . CATARACT EXTRACTION W/PHACO Right 03/09/2018   Procedure: CATARACT EXTRACTION PHACO AND INTRAOCULAR LENS PLACEMENT (IOC);  Surgeon: Birder Robson, MD;  [facility]  ORS;  Service: Ophthalmology;  Laterality: Right;  [place] 00:38.9 AP% 15.9 CDE 6.19 FLUID PACK LOT # [id number]  . CATARACT EXTRACTION W/PHACO Left 06/22/2018   Procedure: CATARACT EXTRACTION PHACO AND INTRAOCULAR LENS PLACEMENT (IOC);  Surgeon: Birder Robson, MD;  Location: [facility] ORS;  Service: Ophthalmology;  Laterality: Left;  [place] 00:50 AP% 16.5 CDE 8.25 Fluid pack lot # [id number]  . CESAREAN SECTION    . CHOLECYSTECTOMY     . CYSTOSCOPY WITH STENT PLACEMENT     Family History  Problem Relation Age of Onset  . Colon cancer Father   . Pancreatic cancer Sister   . GER disease Son   . Alzheimer's disease Sister    Social History   Socioeconomic History  . Marital status: Widowed    Spouse name: Not on file  . Number of children: 2  . Years of education: Not on file  . Highest education level: High school graduate  Occupational History  . Occupation: retired  Tobacco Use  . Smoking status: Former Smoker    Packs/day: 0.50    Years: 45.00    Pack years: 22.50    Types: Cigarettes  . Smokeless tobacco: Never Used  . Tobacco comment: quit 09/2019  Vaping Use  . Vaping Use: Never used  Substance and Sexual Activity  . Alcohol use: No    Alcohol/week: 0.0 standard drinks  . Drug use: No  . Sexual activity: Not on file  Other Topics Concern  . Not on file  Social History Narrative  . Not on file   Social Determinants of Health   Financial Resource Strain: Low Risk   . Difficulty of Paying Living Expenses: Not hard at all  Food Insecurity: No Food Insecurity  . Worried About Charity fundraiser in the Last Year: Never true  . Ran Out of Food in the Last Year: Never true  Transportation Needs: No Transportation Needs  . Lack of Transportation (Medical): No  . Lack of Transportation (Non-Medical): No  Physical Activity: Insufficiently Active  . Days of Exercise per Week: 1 day  . Minutes of Exercise per Session: 60 min  Stress: No Stress Concern Present  . Feeling of Stress : Not at all  Social Connections: Socially Isolated  . Frequency of Communication with Friends and Family: Twice a week  . Frequency of Social Gatherings with Friends and Family: More than three times a week  . Attends Religious Services: Never  . Active Member of Clubs or Organizations: No  . Attends Archivist Meetings: Never  . Marital Status: Widowed    Tobacco Counseling Counseling given: Not  Answered Comment: quit 09/2019   Clinical Intake:  Pre-visit preparation completed: Yes  Pain : No/denies pain     Nutritional Risks: None Diabetes: No  How often do you need to have someone help you when you read instructions, pamphlets, or other written materials from your doctor or pharmacy?: 1 - Never  Diabetic? No  Interpreter Needed?: No  Information entered by :: MMarkoski, LPN   Activities of Daily Living In your present state of health, do you have any difficulty performing the following activities: 06/26/2020  Hearing? N  Vision? N  Difficulty concentrating or making decisions? Y  Comment Has Alzheimers disease.  Walking or climbing stairs? Y  Comment Due to mobility and balance issues.  Dressing or bathing? Y  Comment Grandson assist with dressing.  Doing errands, shopping? Y  Comment Does not drive.  Preparing Food and eating ?  Y  Comment Pt does not cook.  Using the Toilet? N  In the past six months, have you accidently leaked urine? Y  Comment Wears depends daily.  Do you have problems with loss of bowel control? Y  Comment Wears depends daily.  Managing your Medications? Y  Comment Grandson manages all medications.  Managing your Finances? Y  Comment [employer].  Housekeeping or managing your Housekeeping? Y  Comment Grandson manages all house work.  Some recent data might be hidden    Patient Care Team: Jerrol Banana., MD as PCP - General (Family Medicine) Murlean Iba, MD as Consulting Physician (Internal Medicine) Schnier, Dolores Lory, MD (Vascular Surgery) Birder Robson, MD as Referring Physician (Ophthalmology) Phillips Grout, PT as Physical Therapist (Physical Therapy)  Indicate any recent Medical Services you may have received from other than [facility] providers in the past year (date may be approximate).     Assessment:   This is a routine wellness examination for [facility].  Hearing/Vision screen No exam data  present  Dietary issues and exercise activities discussed: Current Exercise Habits: The patient does not participate in regular exercise at present, Exercise limited by: psychological condition(s)  Goals      Patient Stated   .  "We need to simplify things for Mom" (pt-stated)      Pharmacist Clinical Goal(s): Over the next 10 days, CCM pharmacist will collaborate with physician to simplify Ms. Duma's medication regimen.    Interventions: - CCM pharmacist updated medication list based  - Discontinue donepezil due to limited efficacy and bowel incontinence - discontinue alendronate for drug holiday  - need to schedule a new DEXA bone density exam (order has been placed) - caregivers will consider the pros and cons of twice daily memantine (Namenda) for dementia   Patient Self Care Activities:  . Per caregiver, patient must be observed taking medications   Please see past updates related to this goal by clicking on the "Past Updates" button in the selected goal         Other   .  Prevent falls      Recommend to remove any items from the home that may cause slips or trips.      Depression Screen PHQ 2/9 Scores 06/26/2020 06/23/2019 12/15/2018 06/18/2018 02/11/2018 12/16/2016 05/02/2015  PHQ - 2 Score - 0 0 0 0 0 1  Exception Documentation Medical reason - - - - - Medical reason    Fall Risk Fall Risk  06/26/2020 06/23/2019 12/15/2018 06/18/2018 02/11/2018  Falls in the past year? 1 0 0 No No  Number falls in past yr: 1 - - - -  Injury with Fall? 1 - - - -  Risk for fall due to : Impaired balance/gait;Impaired mobility;Other (Comment) - - - -  Risk for fall due to: Comment has Alzheimers disease - - - -  Follow up Falls prevention discussed - - - -    Any stairs in or around the home? Yes  If so, are there any without handrails? No  Home free of loose throw rugs in walkways, pet beds, electrical cords, etc? Yes  Adequate lighting in your home to reduce risk of falls? Yes    ASSISTIVE DEVICES UTILIZED TO PREVENT FALLS:  Life alert? No  Use of a cane, walker or w/c? Yes  Grab bars in the bathroom? Yes  Shower chair or bench in shower? Yes  Elevated toilet seat or a handicapped toilet? Yes  Cognitive Function: Unable to complete due to speaking with grandson.     6CIT Screen 06/18/2018 12/16/2016  What Year? 4 points 0 points  What month? 3 points 0 points  What time? 3 points 0 points  Count back from 20 4 points 2 points  Months in reverse 4 points 4 points  Repeat phrase 10 points 10 points  Total Score 28 16    Immunizations Immunization History  Administered Date(s) Administered  . Pneumococcal Conjugate-13 08/01/2014  . Pneumococcal Polysaccharide-23 03/01/2013  . Td 02/10/1996  . Tdap 05/30/2017, 09/13/2019    TDAP status: Up to date Flu Vaccine status: Due fall 2021 Pneumococcal vaccine status: Up to date Covid-19 vaccine status: Completed vaccines  Qualifies for Shingles Vaccine? Yes   Zostavax completed No   Shingrix Completed?: No.    Education has been provided regarding the importance of this vaccine. Patient has been advised to call insurance company to determine out of pocket expense if they have not yet received this vaccine. Advised may also receive vaccine at local pharmacy or Health Dept. Verbalized acceptance and understanding.  Screening Tests Health Maintenance  Topic Date Due  . COVID-19 Vaccine (1) Never done  . DEXA SCAN  07/12/2015  . INFLUENZA VACCINE  05/27/2020  . TETANUS/TDAP  09/12/2029  . PNA vac Low Risk Adult  Completed    Health Maintenance  Health Maintenance Due  Topic Date Due  . COVID-19 Vaccine (1) Never done  . DEXA SCAN  07/12/2015  . INFLUENZA VACCINE  05/27/2020    Colorectal cancer screening: No longer required.  Mammogram status: No longer required.  Bone Density status: Ordered today. Pt provided with contact info and advised to call to schedule appt.  Lung Cancer Screening:  (Low Dose CT Chest recommended if Age 81-80 years, 30 pack-year currently smoking OR have quit w/in 15years.) does not qualify.   Additional Screening:  Vision Screening: Recommended annual ophthalmology exams for early detection of glaucoma and other disorders of the eye. Is the patient up to date with their annual eye exam?  Yes  Who is the provider or what is the name of the office in which the patient attends annual eye exams? Dr George Ina @ [place] If pt is not established with a provider, would they like to be referred to a provider to establish care? No .   Dental Screening: Recommended annual dental exams for proper oral hygiene  Community Resource Referral / Chronic Care Management: CRR required this visit?  No   CCM required this visit?  No      Plan:     I have personally reviewed and noted the following in the patient's chart:   . Medical and social history . Use of alcohol, tobacco or illicit drugs  . Current medications and supplements . Functional ability and status . Nutritional status . Physical activity . Advanced directives . List of other physicians . Hospitalizations, surgeries, and ER visits in previous 12 months . Vitals . Screenings to include cognitive, depression, and falls . Referrals and appointments  In addition, I have reviewed and discussed with patient certain preventive protocols, quality metrics, and best practice recommendations. A written personalized care plan for preventive services as well as general preventive health recommendations were provided to patient.     Trystan Akhtar [place], [place]   1/61/0960   Nurse Notes: Pt to receive a flu shot at next in office apt. Advised grandson to bring Covid vaccine card to apt to up date in chart.

## 2020-06-26 ENCOUNTER — Ambulatory Visit (INDEPENDENT_AMBULATORY_CARE_PROVIDER_SITE_OTHER): Payer: Medicare Other

## 2020-06-26 ENCOUNTER — Other Ambulatory Visit: Payer: Self-pay

## 2020-06-26 DIAGNOSIS — M810 Age-related osteoporosis without current pathological fracture: Secondary | ICD-10-CM

## 2020-06-26 DIAGNOSIS — Z0000 Encounter for general adult medical examination without abnormal findings: Secondary | ICD-10-CM | POA: Diagnosis not present

## 2020-06-26 NOTE — Patient Instructions (Signed)
[name] , Thank you for taking time to come for your Medicare Wellness Visit. I appreciate your ongoing commitment to your health goals. Please review the following plan we discussed and let me know if I can assist you in the future.   Screening recommendations/referrals: Colonoscopy: No longer required.  Mammogram: No longer required.  Bone Density: Ordered today. Pt provided with contact info and advised to call to schedule appt. Recommended yearly ophthalmology/optometry visit for glaucoma screening and checkup Recommended yearly dental visit for hygiene and checkup  Vaccinations: Influenza vaccine: Due fall 2021 Pneumococcal vaccine: Completed series Tdap vaccine: Up to date, due 08/2029 Shingles vaccine: Shingrix discussed. Please contact your pharmacy for coverage information.     Advanced directives: Please bring a copy of your POA (Power of Attorney) and/or Living Will to your next appointment.   Conditions/risks identified: Fall risk preventatives discussed today.   Next appointment: 07/03/20 @ 9:40 AM with Dr Rosanna Randy. Declined scheduling an AWV for 2022 at this time.    Preventive Care 1 Years and Older, Female Preventive care refers to lifestyle choices and visits with your health care provider that can promote health and wellness. What does preventive care include?  A yearly physical exam. This is also called an annual well check.  Dental exams once or twice a year.  Routine eye exams. Ask your health care provider how often you should have your eyes checked.  Personal lifestyle choices, including:  Daily care of your teeth and gums.  Regular physical activity.  Eating a healthy diet.  Avoiding tobacco and drug use.  Limiting alcohol use.  Practicing safe sex.  Taking low-dose aspirin every day.  Taking vitamin and mineral supplements as recommended by your health care provider. What happens during an annual well check? The services and screenings done  by your health care provider during your annual well check will depend on your age, overall health, lifestyle risk factors, and family history of disease. Counseling  Your health care provider may ask you questions about your:  Alcohol use.  Tobacco use.  Drug use.  Emotional well-being.  Home and relationship well-being.  Sexual activity.  Eating habits.  History of falls.  Memory and ability to understand (cognition).  Work and work Statistician.  Reproductive health. Screening  You may have the following tests or measurements:  Height, weight, and BMI.  Blood pressure.  Lipid and cholesterol levels. These may be checked every 5 years, or more frequently if you are over 77 years old.  Skin check.  Lung cancer screening. You may have this screening every year starting at age 37 if you have a 30-pack-year history of smoking and currently smoke or have quit within the past 15 years.  Fecal occult blood test (FOBT) of the stool. You may have this test every year starting at age 43.  Flexible sigmoidoscopy or colonoscopy. You may have a sigmoidoscopy every 5 years or a colonoscopy every 10 years starting at age 6.  Hepatitis C blood test.  Hepatitis B blood test.  Sexually transmitted disease (STD) testing.  Diabetes screening. This is done by checking your blood sugar (glucose) after you have not eaten for a while (fasting). You may have this done every 1-3 years.  Bone density scan. This is done to screen for osteoporosis. You may have this done starting at age 71.  Mammogram. This may be done every 1-2 years. Talk to your health care provider about how often you should have regular mammograms. Talk with  your health care provider about your test results, treatment options, and if necessary, the need for more tests. Vaccines  Your health care provider may recommend certain vaccines, such as:  Influenza vaccine. This is recommended every year.  Tetanus,  diphtheria, and acellular pertussis (Tdap, Td) vaccine. You may need a Td booster every 10 years.  Zoster vaccine. You may need this after age 70.  Pneumococcal 13-valent conjugate (PCV13) vaccine. One dose is recommended after age 98.  Pneumococcal polysaccharide (PPSV23) vaccine. One dose is recommended after age 43. Talk to your health care provider about which screenings and vaccines you need and how often you need them. This information is not intended to replace advice given to you by your health care provider. Make sure you discuss any questions you have with your health care provider. Document Released: 11/09/2015 Document Revised: 07/02/2016 Document Reviewed: 08/14/2015 Elsevier Interactive Patient Education  2017 [place] Prevention in the Home Falls can cause injuries. They can happen to people of all ages. There are many things you can do to make your home safe and to help prevent falls. What can I do on the outside of my home?  Regularly fix the edges of walkways and driveways and fix any cracks.  Remove anything that might make you trip as you walk through a door, such as a raised step or threshold.  Trim any bushes or trees on the path to your home.  Use bright outdoor lighting.  Clear any walking paths of anything that might make someone trip, such as rocks or tools.  Regularly check to see if handrails are loose or broken. Make sure that both sides of any steps have handrails.  Any raised decks and porches should have guardrails on the edges.  Have any leaves, snow, or ice cleared regularly.  Use sand or salt on walking paths during winter.  Clean up any spills in your garage right away. This includes oil or grease spills. What can I do in the bathroom?  Use night lights.  Install grab bars by the toilet and in the tub and shower. Do not use towel bars as grab bars.  Use non-skid mats or decals in the tub or shower.  If you need to sit down in  the shower, use a plastic, non-slip stool.  Keep the floor dry. Clean up any water that spills on the floor as soon as it happens.  Remove soap buildup in the tub or shower regularly.  Attach bath mats securely with double-sided non-slip rug tape.  Do not have throw rugs and other things on the floor that can make you trip. What can I do in the bedroom?  Use night lights.  Make sure that you have a light by your bed that is easy to reach.  Do not use any sheets or blankets that are too big for your bed. They should not hang down onto the floor.  Have a firm chair that has side arms. You can use this for support while you get dressed.  Do not have throw rugs and other things on the floor that can make you trip. What can I do in the kitchen?  Clean up any spills right away.  Avoid walking on wet floors.  Keep items that you use a lot in easy-to-reach places.  If you need to reach something above you, use a strong step stool that has a grab bar.  Keep electrical cords out of the way.  Do not  use floor polish or wax that makes floors slippery. If you must use wax, use non-skid floor wax.  Do not have throw rugs and other things on the floor that can make you trip. What can I do with my stairs?  Do not leave any items on the stairs.  Make sure that there are handrails on both sides of the stairs and use them. Fix handrails that are broken or loose. Make sure that handrails are as long as the stairways.  Check any carpeting to make sure that it is firmly attached to the stairs. Fix any carpet that is loose or worn.  Avoid having throw rugs at the top or bottom of the stairs. If you do have throw rugs, attach them to the floor with carpet tape.  Make sure that you have a light switch at the top of the stairs and the bottom of the stairs. If you do not have them, ask someone to add them for you. What else can I do to help prevent falls?  Wear shoes that:  Do not have high  heels.  Have rubber bottoms.  Are comfortable and fit you well.  Are closed at the toe. Do not wear sandals.  If you use a stepladder:  Make sure that it is fully opened. Do not climb a closed stepladder.  Make sure that both sides of the stepladder are locked into place.  Ask someone to hold it for you, if possible.  Clearly mark and make sure that you can see:  Any grab bars or handrails.  First and last steps.  Where the edge of each step is.  Use tools that help you move around (mobility aids) if they are needed. These include:  Canes.  Walkers.  Scooters.  Crutches.  Turn on the lights when you go into a dark area. Replace any light bulbs as soon as they burn out.  Set up your furniture so you have a clear path. Avoid moving your furniture around.  If any of your floors are uneven, fix them.  If there are any pets around you, be aware of where they are.  Review your medicines with your doctor. Some medicines can make you feel dizzy. This can increase your chance of falling. Ask your doctor what other things that you can do to help prevent falls. This information is not intended to replace advice given to you by your health care provider. Make sure you discuss any questions you have with your health care provider. Document Released: 08/09/2009 Document Revised: 03/20/2016 Document Reviewed: 11/17/2014 Elsevier Interactive Patient Education  2017 [employer].

## 2020-06-27 ENCOUNTER — Other Ambulatory Visit: Payer: Self-pay

## 2020-06-27 ENCOUNTER — Ambulatory Visit: Payer: Medicare Other | Attending: Family Medicine

## 2020-06-27 DIAGNOSIS — Z9181 History of falling: Secondary | ICD-10-CM | POA: Insufficient documentation

## 2020-06-27 DIAGNOSIS — R2681 Unsteadiness on feet: Secondary | ICD-10-CM | POA: Insufficient documentation

## 2020-06-27 NOTE — Therapy (Signed)
[place] MAIN [facility] SERVICES [address] [place], [place], [address] Phone: [phone]   Fax:  [phone]  Physical Therapy Treatment  Patient Details  Name: Sarah [name] MRN: [id number] Date of Birth: Feb 06, 1941 Referring Provider (PT): Dr. Rosanna Randy   Encounter Date: 06/27/2020   PT End of Session - 06/27/20 0942    Visit Number 31    Number of Visits 63    Date for PT Re-Evaluation 08/22/20    Authorization Type eval: 10/31/19, PN: 06/20/20; Re-cert: 04/04/61    PT Start Time 9528    PT Stop Time 1020    PT Time Calculation (min) 42 min    Equipment Utilized During Treatment Gait belt    Activity Tolerance Patient tolerated treatment well    Behavior During Therapy WFL for tasks assessed/performed           Past Medical History:  Diagnosis Date  . Arthritis   . Dementia ([place])    ALZHEIMERS  . Ear anomaly    INNER EAR ISSUES  . Hyperlipidemia   . Hypertension     Past Surgical History:  Procedure Laterality Date  . APPENDECTOMY    . CATARACT EXTRACTION W/PHACO Right 03/09/2018   Procedure: CATARACT EXTRACTION PHACO AND INTRAOCULAR LENS PLACEMENT (IOC);  Surgeon: Birder Robson, MD;  Location: [facility] ORS;  Service: Ophthalmology;  Laterality: Right;  [place] 00:38.9 AP% 15.9 CDE 6.19 FLUID PACK LOT # [id number]  . CATARACT EXTRACTION W/PHACO Left 06/22/2018   Procedure: CATARACT EXTRACTION PHACO AND INTRAOCULAR LENS PLACEMENT (IOC);  Surgeon: Birder Robson, MD;  Location: [facility] ORS;  Service: Ophthalmology;  Laterality: Left;  [place] 00:50 AP% 16.5 CDE 8.25 Fluid pack lot # [id number] H  . CESAREAN SECTION    . CHOLECYSTECTOMY    . CYSTOSCOPY WITH STENT PLACEMENT      There were no vitals filed for this visit.   Subjective Assessment - 06/27/20 0937    Subjective Pt/grandson report doing well today. She had no falls/stumbles this week. She was complaining of L knee pain this morning. Grandson reports family would like for patient  to continue with therapy as her balance and strength continue to improve and her falls have been less deleterious. No specific questions upon arrival.    Patient is accompained by: Family member    Pertinent History Sarah [name] is a 79 y.o. female with a history of severe Alzheimer's dementia, hypertension who was brought to the ED due to a fall at home. She hit the back of her head resulting in bleeding.  She was treated with staples to close her wound and had a follow-up appointment with her PCP who ordered PT due to fall. Per grandson patient's Alzheimer's has progressed significantly over the last few months. He states that she likes to walk but is unstable. She has had 2 falls in the last 6 months. Per grandson at baseline she is AOx1 and is able to recognize her son and grandson with whom she lives. She does not participate in any regular exercise.    Limitations Walking    Patient Stated Goals Improve balance and safety    Currently in Pain? Yes   Pt unable to rate but was complaining to family about L knee pain this morning               TREATMENT   Ther-Ex  Verbal/visual/tactile cues provided, PT also demonstrated/performed exercises with pt to facilitate:  NuStep L2 x69minutes for warm-up during history  taking;  BLE leg press 40# x 20, 55# 1 x 20 and 1 x 10, cued for slow descent, increase weight next time; Sit to stand with BUE handheld support 2 x 10; Seated clams with manual resistance x 10; Seated adductor squeeze with manual resistance x 10; Seated hip flexion marches with manual resistance x 10;   Neuromuscular Re-education Standing soccer ball kicks without UE support; Gait with forward and lateral ball tosses to therapist and grandson x 100'; Therapist danced with patient to tango music in order to work on balance. Performed side stepping in both directions, forward/backward stepping, 180/360 degree turns, and cross-over steps to both directions;    Pt  educated throughout session about proper posture and technique with exercises. Improved exercise technique, movement at target joints, use of target muscles after min to mod verbal, visual, tactile cues.    Patient demonstrates excellent motivation today. Goals were updated recently, so not updated today. Patient's cognitive awareness is a factor when performing the outcome measures as she is not understanding the instructions of the test. Her TUG increased about 9 seconds from the last progress note but has improved from the initial evaluation. She is demonstrating increased strength with the 5TSTS as she is no longer pushing off with her hands.Added goals last session including a goal and leg press weight goal to improve walking endurance and increase LE strength. Pt requires repeated redirection during session due to advanced dementia. She requires creative activities for compliance/performance so utilized dynamic activities like balloon taps and throwing activities. Patient has difficulty walking forwards and backwards with big steps and moving outside of her base of support. Pt encouraged to continue HEP and follow-up as scheduled. She will benefit from PT services to address deficits in strength, balance, and mobility in order to return to full function at home.                       PT Short Term Goals - 06/27/20 1036      PT SHORT TERM GOAL #1   Title Pt will be participate with HEP at the direction of her caregiver in order to improve strength and balance in order to decrease fall risk and improve function at home.    Baseline 04/04/20: Pt resistant to HEP due to dementia; 8/11: Been doing exercises since Sunday about 20 mins a day.    Time 6    Period Weeks    Status Partially Met    Target Date 07/25/20             PT Long Term Goals - 06/27/20 1036      PT LONG TERM GOAL #1   Title Pt will improve BERG by to at least 40/56 in order to demonstrate  clinically significant improvement in balance.    Baseline 10/31/19: 30/56; 01/18/20: 36/56; 04/04/20: 43/56; 06/06/20: 43/56    Time 12    Period Weeks    Status Achieved      PT LONG TERM GOAL #2   Title Pt will decrease TUG to below 14 seconds/decrease in order to demonstrate decreased fall risk.    Baseline 10/31/19: 44.3s; 01/18/20: 32.8s; 04/04/20: 32.1s; 06/06/20: 41.27 secs    Time 12    Period Weeks    Status Partially Met    Target Date 08/22/20      PT LONG TERM GOAL #3   Title Pt will increase to at least 0.50 m/s in order to demonstrate clinically  significant improvement in community ambulation.    Baseline 10/31/19: Self-selected: 57.4s =  0.17 m/s; 01/18/20: Self-selected: 29.4s =  0.34 m/s; 04/04/20: Self-selected: 25.6s =  0.39 m/s; 06/06/20: 26.34s = 0.379 m/s;    Time 12    Period Weeks    Status Partially Met    Target Date 08/22/20      PT LONG TERM GOAL #4   Title Pt will be able to perform sit to stand from a regular height chair without UE support in order to demonstrate improved leg strength and safety    Baseline 10/31/19: Heavy UE dependence with sit to stand transfers; 01/18/20: Able to stand without UE reliance however prefers to place hands on arm rests; 04/04/20: Unchanged; 06/06/20: 5TSTS: 17.28 secs with PT supportng patient's hands;    Time 12    Period Weeks    Status Partially Met    Target Date 08/22/20      PT LONG TERM GOAL #5   Title Pt will be able to perform 55# x 10 reps in order to increase strength of LE and improve function at home.    Baseline 8/25: 40# 3 x 20 reps    Time 12    Period Weeks    Status New    Target Date 08/22/20      PT LONG TERM GOAL #6   Title Pt will increase 2MWT by at least 69m(40 ft) in order to demonstrate clinically significant improvement in cardiopulmonary endurance and community ambulation.    Baseline 8/25: 140 feet    Time 12    Period Weeks    Status New    Target Date 08/22/20                 Plan -  06/27/20 1034    Clinical Impression Statement Patient demonstrates excellent motivation today. Goals were updated recently, so not updated today. Patient's cognitive awareness is a factor when performing the outcome measures as she is not understanding the instructions of the test. Her TUG increased about 9 seconds from the last progress note but has improved from the initial evaluation. She is demonstrating increased strength with the 5TSTS as she is no longer pushing off with her hands. Added goals last session including a 2MWT goal and leg press weight goal to improve walking endurance and increase LE strength. Pt requires repeated redirection during session due to advanced dementia. She requires creative activities for compliance/performance so utilized dynamic activities like balloon taps and throwing activities. Patient has difficulty walking forwards and backwards with big steps and moving outside of her base of support. Pt encouraged to continue HEP and follow-up as scheduled. She will benefit from PT services to address deficits in strength, balance, and mobility in order to return to full function at home.    Personal Factors and Comorbidities Age;Comorbidity 3+    Comorbidities Alzheimer's, anxiety/depression, arthritis    Examination-Activity Limitations Bathing;Bed Mobility;Caring for Others;Carry;Dressing;Hygiene/Grooming;Lift;Locomotion Level;Squat;Stand;Transfers    Examination-Participation Restrictions Community Activity;Driving;Interpersonal Relationship;Laundry;Medication Management;Meal Prep;Personal Finances;Shop    Stability/Clinical Decision Making Unstable/Unpredictable    Rehab Potential Fair    PT Frequency 1x / week    PT Duration 8 weeks    PT Treatment/Interventions ADLs/Self Care Home Management;Aquatic Therapy;Biofeedback;Canalith Repostioning;Cryotherapy;Electrical Stimulation;Iontophoresis 448mml Dexamethasone;Moist Heat;Traction;Ultrasound;DME Instruction;Gait  training;Stair training;Functional mobility training;Therapeutic activities;Therapeutic exercise;Balance training;Neuromuscular re-education;Cognitive remediation;Patient/family education;Manual techniques;Passive range of motion;Dry needling;Vestibular;Joint Manipulations    PT Next Visit Plan Continue with balance and strengthening    PT Home Exercise Plan seated marches,  seated clams, seated LAQ, feet together balance, slow marching, sit to stand with minimal UE dependendence    Consulted and Agree with Plan of Care Family member/caregiver    Family Member Consulted [name]           Patient will benefit from skilled therapeutic intervention in order to improve the following deficits and impairments:  Abnormal gait, Decreased balance, Decreased cognition, Decreased knowledge of precautions, Decreased safety awareness, Decreased strength, Difficulty walking  Visit Diagnosis: Unsteadiness on feet - Plan: PT plan of care cert/re-cert  History of falling - Plan: PT plan of care cert/re-cert     Problem List Patient Active Problem List   Diagnosis Date Noted  . Malnutrition of moderate degree ([place]) 01/25/2020  . Chronic kidney disease due to hypertension 07/24/2019  . Mesenteric artery stenosis ([place]) 03/24/2019  . Renal artery stenosis ([place]) 01/26/2017  . Personal history of tobacco use, presenting hazards to health 06/23/2016  . Depressive disorder 05/29/2015  . Dementia ([place]) 05/29/2015  . Allergic rhinitis 03/05/2015  . Weak pulse 03/05/2015  . Smokes tobacco daily 03/05/2015  . Generalized anxiety disorder 03/05/2015  . Acid reflux 03/05/2015  . HLD (hyperlipidemia) 03/05/2015  . Affective disorder, major 03/05/2015  . Bad memory 03/05/2015  . Arthritis, degenerative 03/05/2015  . OP (osteoporosis) 03/05/2015  . Renovascular hypertension 03/05/2015  . Scoliosis 03/05/2015  . Avitaminosis D 03/05/2015  . Weight loss 11/07/2014  . Family history of colon cancer  11/07/2014   Phillips Grout PT, DPT, GCS  Kyria Bumgardner 06/27/2020, 10:50 AM  [place] MAIN [facility] SERVICES [address] [place], [place], [address] Phone: [phone]   Fax:  [phone]  Name: Sarah [name] MRN: [id number] Date of Birth: 1941-08-23

## 2020-07-04 ENCOUNTER — Ambulatory Visit: Payer: Medicare Other

## 2020-07-04 ENCOUNTER — Other Ambulatory Visit: Payer: Self-pay

## 2020-07-04 DIAGNOSIS — R2681 Unsteadiness on feet: Secondary | ICD-10-CM | POA: Diagnosis not present

## 2020-07-04 DIAGNOSIS — Z9181 History of falling: Secondary | ICD-10-CM | POA: Diagnosis not present

## 2020-07-04 NOTE — Therapy (Signed)
[place] MAIN [facility] SERVICES [address] [place], [place], [address] Phone: [phone]   Fax:  [phone]  Physical Therapy Treatment  Patient Details  Name: Sarah [name] MRN: [id number] Date of Birth: [birth year] Referring Provider (PT): Dr. Rosanna Randy   Encounter Date: 07/04/2020   PT End of Session - 07/04/20 1114    Visit Number 32    Number of Visits 37    Date for PT Re-Evaluation 08/22/20    Authorization Type eval: 10/31/19, PN: 06/20/20; Re-cert: 12/01/93    PT Start Time 0940    PT Stop Time 1015    PT Time Calculation (min) 35 min    Equipment Utilized During Treatment Gait belt    Activity Tolerance Patient tolerated treatment well    Behavior During Therapy WFL for tasks assessed/performed           Past Medical History:  Diagnosis Date  . Arthritis   . Dementia ([place])    ALZHEIMERS  . Ear anomaly    INNER EAR ISSUES  . Hyperlipidemia   . Hypertension     Past Surgical History:  Procedure Laterality Date  . APPENDECTOMY    . CATARACT EXTRACTION W/PHACO Right 03/09/2018   Procedure: CATARACT EXTRACTION PHACO AND INTRAOCULAR LENS PLACEMENT (IOC);  Surgeon: Birder Robson, MD;  Location: [facility] ORS;  Service: Ophthalmology;  Laterality: Right;  [place] 00:38.9 AP% 15.9 CDE 6.19 FLUID PACK LOT # [id number] H  . CATARACT EXTRACTION W/PHACO Left 06/22/2018   Procedure: CATARACT EXTRACTION PHACO AND INTRAOCULAR LENS PLACEMENT (IOC);  Surgeon: Birder Robson, MD;  Location: [facility] ORS;  Service: Ophthalmology;  Laterality: Left;  [place] 00:50 AP% 16.5 CDE 8.25 Fluid pack lot # [id number] H  . CESAREAN SECTION    . CHOLECYSTECTOMY    . CYSTOSCOPY WITH STENT PLACEMENT      There were no vitals filed for this visit.   Subjective Assessment - 07/04/20 0945    Subjective Pt/grandson report doing well today. She had no falls/stumbles this week. She continues complaining of left knee pain. No specific questions upon arrival.    Patient is  accompained by: Family member    Pertinent History Sarah [name] is a 79 y.o. female with a history of severe Alzheimer's dementia, hypertension who was brought to the ED due to a fall at home. She hit the back of her head resulting in bleeding.  She was treated with staples to close her wound and had a follow-up appointment with her PCP who ordered PT due to fall. Per grandson patient's Alzheimer's has progressed significantly over the last few months. He states that she likes to walk but is unstable. She has had 2 falls in the last 6 months. Per grandson at baseline she is AOx1 and is able to recognize her son and grandson with whom she lives. She does not participate in any regular exercise.    Limitations Walking    Patient Stated Goals Improve balance and safety    Currently in Pain? Yes   Left knee, chronic.  Patient unable to rate             TREATMENT   Ther-Ex  Verbal/visual/tactile cues provided, PT also demonstrated/performed exercises with pt to facilitate:  NuStep L2 x82mnutes for warm-up during history taking;  Matrix resisted walkouts forward and backwards 7.5 pounds x 2 each Sit to stand with BUE handheld support x 10, x 10 again with 2kg med ball pass; Standing 2kg med ball throws to grandson  Seated clams with manual resistance x 10; Seated adductor squeeze with manual resistance x 10; Seated hip flexion marches with manual resistance x 10;   Neuromuscular Re-education Standing soccer ball kicks without UE support; Therapist danced with patient to tango music in order to work on balance. Performed side stepping in both directions, forward/backward stepping, 180/360 degree turns, and cross-over steps to both directions;    Pt educated throughout session about proper posture and technique with exercises. Improved exercise technique, movement at target joints, use of target muscles after min to mod verbal, visual, tactile cues.   Patient arrived late so  session is somewhat limited.  She is slightly less engaged today and more fearful of falling. Pt requires repeated redirection during session due to advanced dementia. Sherequires creative activities for compliance/performanceso utilized Engineer, drilling ball kicks and dancing.  Reintroduced resisted matrix walkouts during session today but avoided leg press due to left knee pain.  Patient has difficultywalking forwards and backwards with big steps and moving outside of her base of support.Pt encouraged to continue HEP and follow-up as scheduled.She will benefit from PT services to address deficits in strength, balance, and mobility in order to return to full function at home.                           PT Short Term Goals - 06/27/20 1036      PT SHORT TERM GOAL #1   Title Pt will be participate with HEP at the direction of her caregiver in order to improve strength and balance in order to decrease fall risk and improve function at home.    Baseline 04/04/20: Pt resistant to HEP due to dementia; 8/11: Been doing exercises since Sunday about 20 mins a day.    Time 6    Period Weeks    Status Partially Met    Target Date 07/25/20             PT Long Term Goals - 06/27/20 1036      PT LONG TERM GOAL #1   Title Pt will improve BERG by to at least 40/56 in order to demonstrate clinically significant improvement in balance.    Baseline 10/31/19: 30/56; 01/18/20: 36/56; 04/04/20: 43/56; 06/06/20: 43/56    Time 12    Period Weeks    Status Achieved      PT LONG TERM GOAL #2   Title Pt will decrease TUG to below 14 seconds/decrease in order to demonstrate decreased fall risk.    Baseline 10/31/19: 44.3s; 01/18/20: 32.8s; 04/04/20: 32.1s; 06/06/20: 41.27 secs    Time 12    Period Weeks    Status Partially Met    Target Date 08/22/20      PT LONG TERM GOAL #3   Title Pt will increase 10MWT to at least 0.50 m/s in order to demonstrate clinically significant  improvement in community ambulation.    Baseline 10/31/19: Self-selected: 57.4s =  0.17 m/s; 01/18/20: Self-selected: 29.4s =  0.34 m/s; 04/04/20: Self-selected: 25.6s =  0.39 m/s; 06/06/20: 26.34s = 0.379 m/s;    Time 12    Period Weeks    Status Partially Met    Target Date 08/22/20      PT LONG TERM GOAL #4   Title Pt will be able to perform sit to stand from a regular height chair without UE support in order to demonstrate improved leg strength and safety    Baseline 10/31/19: Heavy  UE dependence with sit to stand transfers; 01/18/20: Able to stand without UE reliance however prefers to place hands on arm rests; 04/04/20: Unchanged; 06/06/20: 5TSTS: 17.28 secs with PT supportng patient's hands;    Time 12    Period Weeks    Status Partially Met    Target Date 08/22/20      PT LONG TERM GOAL #5   Title Pt will be able to perform 55# x 10 reps in order to increase strength of LE and improve function at home.    Baseline 8/25: 40# 3 x 20 reps    Time 12    Period Weeks    Status New    Target Date 08/22/20      PT LONG TERM GOAL #6   Title Pt will increase 2MWT by at least 97m(40 ft) in order to demonstrate clinically significant improvement in cardiopulmonary endurance and community ambulation.    Baseline 8/25: 140 feet    Time 12    Period Weeks    Status New    Target Date 08/22/20                 Plan - 07/04/20 1018    Clinical Impression Statement Patient arrived late so session is somewhat limited.  She is slightly less engaged today and more fearful of falling. Pt requires repeated redirection during session due to advanced dementia. She requires creative activities for compliance/performance so utilized dynamic activities like soccer ball kicks and dancing.  Reintroduced resisted matrix walkouts during session today but avoided leg press due to left knee pain.  Patient has difficulty walking forwards and backwards with big steps and moving outside of her base of support. Pt  encouraged to continue HEP and follow-up as scheduled. She will benefit from PT services to address deficits in strength, balance, and mobility in order to return to full function at home.    Personal Factors and Comorbidities Age;Comorbidity 3+    Comorbidities Alzheimer's, anxiety/depression, arthritis    Examination-Activity Limitations Bathing;Bed Mobility;Caring for Others;Carry;Dressing;Hygiene/Grooming;Lift;Locomotion Level;Squat;Stand;Transfers    Examination-Participation Restrictions Community Activity;Driving;Interpersonal Relationship;Laundry;Medication Management;Meal Prep;Personal Finances;Shop    Stability/Clinical Decision Making Unstable/Unpredictable    Rehab Potential Fair    PT Frequency 1x / week    PT Duration 8 weeks    PT Treatment/Interventions ADLs/Self Care Home Management;Aquatic Therapy;Biofeedback;Canalith Repostioning;Cryotherapy;Electrical Stimulation;Iontophoresis 421mml Dexamethasone;Moist Heat;Traction;Ultrasound;DME Instruction;Gait training;Stair training;Functional mobility training;Therapeutic activities;Therapeutic exercise;Balance training;Neuromuscular re-education;Cognitive remediation;Patient/family education;Manual techniques;Passive range of motion;Dry needling;Vestibular;Joint Manipulations    PT Next Visit Plan Continue with balance and strengthening    PT Home Exercise Plan seated marches, seated clams, seated LAQ, feet together balance, slow marching, sit to stand with minimal UE dependendence    Consulted and Agree with Plan of Care Family member/caregiver    Family Member Consulted GrAdolphus Birchwood         Patient will benefit from skilled therapeutic intervention in order to improve the following deficits and impairments:  Abnormal gait, Decreased balance, Decreased cognition, Decreased knowledge of precautions, Decreased safety awareness, Decreased strength, Difficulty walking  Visit Diagnosis: Unsteadiness on feet  History of  falling     Problem List Patient Active Problem List   Diagnosis Date Noted  . Malnutrition of moderate degree ([place]03/31/2021  . Chronic kidney disease due to hypertension 07/24/2019  . Mesenteric artery stenosis ([place]05/28/2020  . Renal artery stenosis ([place]04/11/2016  . Personal history of tobacco use, presenting hazards to health 06/23/2016  . Depressive disorder 05/29/2015  .  Dementia ([place]) 05/29/2015  . Allergic rhinitis 03/05/2015  . Weak pulse 03/05/2015  . Smokes tobacco daily 03/05/2015  . Generalized anxiety disorder 03/05/2015  . Acid reflux 03/05/2015  . HLD (hyperlipidemia) 03/05/2015  . Affective disorder, major 03/05/2015  . Bad memory 03/05/2015  . Arthritis, degenerative 03/05/2015  . OP (osteoporosis) 03/05/2015  . Renovascular hypertension 03/05/2015  . Scoliosis 03/05/2015  . Avitaminosis D 03/05/2015  . Weight loss 11/07/2014  . Family history of colon cancer 11/07/2014   Phillips Grout PT, DPT, GCS  Kelsie Kramp 07/04/2020, 11:16 AM  [place] MAIN [facility] SERVICES [address] [place], [place], [address] Phone: [phone]   Fax:  [phone]  Name: Sarah [name] MRN: [id number] Date of Birth: July 29, 1941

## 2020-07-09 DIAGNOSIS — N184 Chronic kidney disease, stage 4 (severe): Secondary | ICD-10-CM | POA: Diagnosis not present

## 2020-07-09 DIAGNOSIS — I701 Atherosclerosis of renal artery: Secondary | ICD-10-CM | POA: Diagnosis not present

## 2020-07-09 DIAGNOSIS — I10 Essential (primary) hypertension: Secondary | ICD-10-CM | POA: Diagnosis not present

## 2020-07-11 ENCOUNTER — Ambulatory Visit: Payer: Medicare Other

## 2020-07-11 ENCOUNTER — Other Ambulatory Visit: Payer: Self-pay

## 2020-07-11 DIAGNOSIS — Z9181 History of falling: Secondary | ICD-10-CM

## 2020-07-11 DIAGNOSIS — R2681 Unsteadiness on feet: Secondary | ICD-10-CM | POA: Diagnosis not present

## 2020-07-11 NOTE — Therapy (Signed)
[place] MAIN [facility] SERVICES [address] [place], [place], [address] Phone: [phone]   Fax:  [phone]  Physical Therapy Treatment  Patient Details  Name: Sarah [name] MRN: [id number] Date of Birth: 11-04-1940 Referring Provider (PT): Dr. Rosanna Randy   Encounter Date: 07/11/2020   PT End of Session - 07/11/20 1056    Visit Number 33    Number of Visits 25    Date for PT Re-Evaluation 08/22/20    Authorization Type eval: 10/31/19, PN: 06/20/20; Re-cert: 11/28/00    PT Start Time 0940    PT Stop Time 1025    PT Time Calculation (min) 45 min    Equipment Utilized During Treatment Gait belt    Activity Tolerance Patient tolerated treatment well    Behavior During Therapy WFL for tasks assessed/performed           Past Medical History:  Diagnosis Date  . Arthritis   . Dementia ([place])    ALZHEIMERS  . Ear anomaly    INNER EAR ISSUES  . Hyperlipidemia   . Hypertension     Past Surgical History:  Procedure Laterality Date  . APPENDECTOMY    . CATARACT EXTRACTION W/PHACO Right 03/09/2018   Procedure: CATARACT EXTRACTION PHACO AND INTRAOCULAR LENS PLACEMENT (IOC);  Surgeon: Birder Robson, MD;  Location: [facility] ORS;  Service: Ophthalmology;  Laterality: Right;  [place] 00:38.9 AP% 15.9 CDE 6.19 FLUID PACK LOT # [id number]  . CATARACT EXTRACTION W/PHACO Left 06/22/2018   Procedure: CATARACT EXTRACTION PHACO AND INTRAOCULAR LENS PLACEMENT (IOC);  Surgeon: Birder Robson, MD;  Location: [facility] ORS;  Service: Ophthalmology;  Laterality: Left;  [place] 00:50 AP% 16.5 CDE 8.25 Fluid pack lot # [id number]  . CESAREAN SECTION    . CHOLECYSTECTOMY    . CYSTOSCOPY WITH STENT PLACEMENT      There were no vitals filed for this visit.   Subjective Assessment - 07/11/20 0951    Subjective Pt/grandson reports pt has been slightly more confused this week. She has also been talking a lot recently about her deceased husband. She had no falls/stumbles this  week. She continues complaining of knee pain. No specific questions upon arrival.    Patient is accompained by: Family member    Pertinent History Sarah [name] is a 79 y.o. female with a history of severe Alzheimer's dementia, hypertension who was brought to the ED due to a fall at home. She hit the back of her head resulting in bleeding.  She was treated with staples to close her wound and had a follow-up appointment with her PCP who ordered PT due to fall. Per grandson patient's Alzheimer's has progressed significantly over the last few months. He states that she likes to walk but is unstable. She has had 2 falls in the last 6 months. Per grandson at baseline she is AOx1 and is able to recognize her son and grandson with whom she lives. She does not participate in any regular exercise.    Limitations Walking    Patient Stated Goals Improve balance and safety    Currently in Pain? Yes    Pain Score --   Unable to rate   Pain Location Knee    Pain Orientation Right;Left    Pain Descriptors / Indicators Aching    Pain Type Chronic pain    Pain Onset More than a month ago    Pain Frequency Intermittent              TREATMENT  Ther-Ex  Verbal/visual/tactile cues provided, PT also demonstrated/performed exercises with pt to facilitate:  NuStep L2 x59mnutes for warm-up during history taking;  Precor 25# x 20, 40# x 20, 55# x 10; Sit to stand with BUE handheld support x 10;  Standing 2kg med ball throws to grandson Seated clams with manual resistance x 10; Seated adductor squeeze with manual resistance x 10; Seated heel raises with manual resistance x 10;   Neuromuscular Re-education Standing marches with BUE handheld support x 20 BLE; Obstacle course with 1/2 foam roller step-overs, 6" hurdle step-over, and crossing red mad x multiple bouts; 2kg med ball tosses to grandson while therapist guards x 10  Standing soccer ball kicks without UE support; Therapist danced with  patient to tango music in order to work on balance. Performed side stepping in both directions, forward/backward stepping, 180/360 degree turns, and cross-over steps to both directions;    Pt educated throughout session about proper posture technique with exercises. Improved exercise technique, movement at target joints, use of target muscles after min to mod verbal, visual, tactile cues.   Patient demonstrates excellent motivation during session today.  Pt requires repeated redirection during session due to advanced dementia. Sherequires creative activities for compliance/performanceso utilized dEngineer, drillingball kicks and dancing.  She is able to increase her weight on the leg press today and demonstrates improvement with soccer ball kicks during gait. Pt encouraged to continue HEP and follow-up as scheduled.She will benefit from PT services to address deficits in strength, balance, and mobility in order to return to full function at home.                             PT Short Term Goals - 06/27/20 1036      PT SHORT TERM GOAL #1   Title Pt will be participate with HEP at the direction of her caregiver in order to improve strength and balance in order to decrease fall risk and improve function at home.    Baseline 04/04/20: Pt resistant to HEP due to dementia; 8/11: Been doing exercises since Sunday about 20 mins a day.    Time 6    Period Weeks    Status Partially Met    Target Date 07/25/20             PT Long Term Goals - 06/27/20 1036      PT LONG TERM GOAL #1   Title Pt will improve BERG by to at least 40/56 in order to demonstrate clinically significant improvement in balance.    Baseline 10/31/19: 30/56; 01/18/20: 36/56; 04/04/20: 43/56; 06/06/20: 43/56    Time 12    Period Weeks    Status Achieved      PT LONG TERM GOAL #2   Title Pt will decrease TUG to below 14 seconds/decrease in order to demonstrate decreased fall risk.     Baseline 10/31/19: 44.3s; 01/18/20: 32.8s; 04/04/20: 32.1s; 06/06/20: 41.27 secs    Time 12    Period Weeks    Status Partially Met    Target Date 08/22/20      PT LONG TERM GOAL #3   Title Pt will increase 10MWT to at least 0.50 m/s in order to demonstrate clinically significant improvement in community ambulation.    Baseline 10/31/19: Self-selected: 57.4s =  0.17 m/s; 01/18/20: Self-selected: 29.4s =  0.34 m/s; 04/04/20: Self-selected: 25.6s =  0.39 m/s; 06/06/20: 26.34s = 0.379 m/s;  Time 12    Period Weeks    Status Partially Met    Target Date 08/22/20      PT LONG TERM GOAL #4   Title Pt will be able to perform sit to stand from a regular height chair without UE support in order to demonstrate improved leg strength and safety    Baseline 10/31/19: Heavy UE dependence with sit to stand transfers; 01/18/20: Able to stand without UE reliance however prefers to place hands on arm rests; 04/04/20: Unchanged; 06/06/20: 5TSTS: 17.28 secs with PT supportng patient's hands;    Time 12    Period Weeks    Status Partially Met    Target Date 08/22/20      PT LONG TERM GOAL #5   Title Pt will be able to perform 55# x 10 reps in order to increase strength of LE and improve function at home.    Baseline 8/25: 40# 3 x 20 reps    Time 12    Period Weeks    Status New    Target Date 08/22/20      PT LONG TERM GOAL #6   Title Pt will increase 2MWT by at least 68m(40 ft) in order to demonstrate clinically significant improvement in cardiopulmonary endurance and community ambulation.    Baseline 8/25: 140 feet    Time 12    Period Weeks    Status New    Target Date 08/22/20                 Plan - 07/11/20 1057    Clinical Impression Statement Patient demonstrates excellent motivation during session today.  Pt requires repeated redirection during session due to advanced dementia. She requires creative activities for compliance/performance so utilized dynamic activities like soccer ball kicks and  dancing.  She is able to increase her weight on the leg press today and demonstrates improvement with soccer ball kicks during gait.  Pt encouraged to continue HEP and follow-up as scheduled. She will benefit from PT services to address deficits in strength, balance, and mobility in order to return to full function at home.    Personal Factors and Comorbidities Age;Comorbidity 3+    Comorbidities Alzheimer's, anxiety/depression, arthritis    Examination-Activity Limitations Bathing;Bed Mobility;Caring for Others;Carry;Dressing;Hygiene/Grooming;Lift;Locomotion Level;Squat;Stand;Transfers    Examination-Participation Restrictions Community Activity;Driving;Interpersonal Relationship;Laundry;Medication Management;Meal Prep;Personal Finances;Shop    Stability/Clinical Decision Making Unstable/Unpredictable    Rehab Potential Fair    PT Frequency 1x / week    PT Duration 8 weeks    PT Treatment/Interventions ADLs/Self Care Home Management;Aquatic Therapy;Biofeedback;Canalith Repostioning;Cryotherapy;Electrical Stimulation;Iontophoresis 427mml Dexamethasone;Moist Heat;Traction;Ultrasound;DME Instruction;Gait training;Stair training;Functional mobility training;Therapeutic activities;Therapeutic exercise;Balance training;Neuromuscular re-education;Cognitive remediation;Patient/family education;Manual techniques;Passive range of motion;Dry needling;Vestibular;Joint Manipulations    PT Next Visit Plan Continue with balance and strengthening    PT Home Exercise Plan seated marches, seated clams, seated LAQ, feet together balance, slow marching, sit to stand with minimal UE dependendence    Consulted and Agree with Plan of Care Family member/caregiver    Family Member Consulted GrAdolphus [place]chwood         Patient will benefit from skilled therapeutic intervention in order to improve the following deficits and impairments:  Abnormal gait, Decreased balance, Decreased cognition, Decreased knowledge of  precautions, Decreased safety awareness, Decreased strength, Difficulty walking  Visit Diagnosis: Unsteadiness on feet  History of falling     Problem List Patient Active Problem List   Diagnosis Date Noted  . [place] moderate degree [place]  . Chronic  kidney disease due to hypertension 07/24/2019  . Mesenteric artery stenosis ([place]) 03/24/2019  . Renal artery stenosis ([place]) 01/26/2017  . Personal history of tobacco use, presenting hazards to health 06/23/2016  . Depressive disorder 05/29/2015  . Dementia ([place]) 05/29/2015  . Allergic rhinitis 03/05/2015  . Weak pulse 03/05/2015  . Smokes tobacco daily 03/05/2015  . Generalized anxiety disorder 03/05/2015  . Acid reflux 03/05/2015  . HLD (hyperlipidemia) 03/05/2015  . Affective disorder, major 03/05/2015  . Bad memory 03/05/2015  . Arthritis, degenerative 03/05/2015  . OP (osteoporosis) 03/05/2015  . Renovascular hypertension 03/05/2015  . Scoliosis 03/05/2015  . Avitaminosis D 03/05/2015  . Weight loss 11/07/2014  . Family history of colon cancer 11/07/2014   Phillips Grout PT, DPT, GCS  Eleri Ruben 07/11/2020, 11:01 AM  [place] MAIN [facility] SERVICES [address] [place], [place], [address] Phone: [phone]   Fax:  [phone]  Name: TAUNA MACFARLANE MRN: [id number] Date of Birth: 09-11-1941

## 2020-07-18 ENCOUNTER — Ambulatory Visit: Payer: Medicare Other

## 2020-07-20 NOTE — Progress Notes (Deleted)
     Established patient visit   Patient: Sarah [name]   DOB: [birth year]   79 y.o. Female  MRN: [id number] Visit Date: 07/23/2020  Today's healthcare provider: Wilhemena Durie, MD   No chief complaint on file.  Subjective    HPI  Late onset Alzheimer's disease without behavioral disturbance ([place]) From 05/23/2020-Progressive.  Mood swings From 05/23/2020-May need to treat with Seroquel.  Will follow clinically.  {Show patient history (optional):23778::" "}   Medications: Outpatient Medications Prior to Visit  Medication Sig  . acetaminophen (TYLENOL) 500 MG tablet Take 500 mg by mouth at bedtime.   Marland Kitchen CALCIUM CARBONATE-VITAMIN D PO Take 1 tablet by mouth 4 (four) times a week.   . cholecalciferol (VITAMIN D) 1000 units tablet Take 1,000 Units by mouth 4 (four) times a week.   . Cobalamin Combinations (FOLTRATE) 500-1 MCG-MG TABS Take by mouth. (Patient not taking: Reported on 06/26/2020)  . cyanocobalamin 1000 MCG tablet Take 1,000 mcg by mouth 4 (four) times a week.   Marland Kitchen lisinopril (ZESTRIL) 20 MG tablet Take 1 Tablet By Mouth Daily.  . Probiotic CAPS Take 1 capsule by mouth daily.  . vitamin C (ASCORBIC ACID) 500 MG tablet Take 500 mg by mouth daily.  . vitamin E 400 UNIT capsule Take 400 Units by mouth 4 (four) times a week.    Facility-Administered Medications Prior to Visit  Medication Dose Route Frequency Provider  . lidocaine (PF) (XYLOCAINE) 1 % injection 2 mL  2 mL Intradermal Once Virginia Crews, MD  . lidocaine (PF) (XYLOCAINE) 1 % injection 2 mL  2 mL Intradermal Once Virginia Crews, MD  . lidocaine (PF) (XYLOCAINE) 1 % injection 2 mL  2 mL Intradermal Once Virginia Crews, MD  . lidocaine (PF) (XYLOCAINE) 1 % injection 2 mL  2 mL Intradermal Once Virginia Crews, MD  . methylPREDNISolone acetate (DEPO-MEDROL) injection 40 mg  40 mg Intramuscular Once Virginia Crews, MD  . methylPREDNISolone acetate (DEPO-MEDROL) injection 40 mg  40 mg  Intramuscular Once Virginia Crews, MD    Review of Systems  Constitutional: Negative for appetite change, chills, fatigue and fever.  Respiratory: Negative for chest tightness and shortness of breath.   Cardiovascular: Negative for chest pain and palpitations.  Gastrointestinal: Negative for abdominal pain, nausea and vomiting.  Neurological: Negative for dizziness and weakness.    {Heme  Chem  Endocrine  Serology  Results Review (optional):23779::" "}  Objective    There were no vitals taken for this visit. {Show previous vital signs (optional):23777::" "}  Physical Exam  ***  No results found for any visits on 07/23/20.  Assessment & Plan     ***  No follow-ups on file.      {provider attestation***:1}   Wilhemena Durie, MD  [facility] [phone] (phone) [phone] (fax)  [place]

## 2020-07-23 ENCOUNTER — Ambulatory Visit: Payer: Self-pay | Admitting: Family Medicine

## 2020-07-25 ENCOUNTER — Other Ambulatory Visit: Payer: Self-pay

## 2020-07-25 ENCOUNTER — Ambulatory Visit: Payer: Medicare Other

## 2020-07-25 DIAGNOSIS — Z9181 History of falling: Secondary | ICD-10-CM

## 2020-07-25 DIAGNOSIS — R2681 Unsteadiness on feet: Secondary | ICD-10-CM

## 2020-07-25 NOTE — Therapy (Signed)
[place] MAIN [facility] SERVICES [address] [place], [place], [address] Phone: [phone]   Fax:  [phone]  Physical Therapy Treatment  Patient Details  Name: Sarah [name] MRN: [id number] Date of Birth: [birth year] Referring Provider (PT): Dr. Rosanna Randy   Encounter Date: 07/25/2020   PT End of Session - 07/25/20 1051    Visit Number 34    Number of Visits 37    Date for PT Re-Evaluation 08/22/20    Authorization Type eval: 10/31/19, PN: 06/20/20; Re-cert: 7/0/62    PT Start Time 0945    PT Stop Time 1025    PT Time Calculation (min) 40 min    Equipment Utilized During Treatment Gait belt    Activity Tolerance Patient tolerated treatment well    Behavior During Therapy WFL for tasks assessed/performed           Past Medical History:  Diagnosis Date  . Arthritis   . Dementia ([place])    ALZHEIMERS  . Ear anomaly    INNER EAR ISSUES  . Hyperlipidemia   . Hypertension     Past Surgical History:  Procedure Laterality Date  . APPENDECTOMY    . CATARACT EXTRACTION W/PHACO Right 03/09/2018   Procedure: CATARACT EXTRACTION PHACO AND INTRAOCULAR LENS PLACEMENT (IOC);  Surgeon: Birder Robson, MD;  Location: [facility] ORS;  Service: Ophthalmology;  Laterality: Right;  [place] 00:38.9 AP% 15.9 CDE 6.19 FLUID PACK LOT # [id number]  . CATARACT EXTRACTION W/PHACO Left 06/22/2018   Procedure: CATARACT EXTRACTION PHACO AND INTRAOCULAR LENS PLACEMENT (IOC);  Surgeon: Birder Robson, MD;  Location: [facility] ORS;  Service: Ophthalmology;  Laterality: Left;  [place] 00:50 AP% 16.5 CDE 8.25 Fluid pack lot # [id number]  . CESAREAN SECTION    . CHOLECYSTECTOMY    . CYSTOSCOPY WITH STENT PLACEMENT      There were no vitals filed for this visit.   Subjective Assessment - 07/25/20 1048    Subjective Pt/grandson reports pt has been slightly more confused this week.  Grandson reports that her balance has been slightly more impaired this week. She continues complaining  of bilateral knee pain and slightly worse this week. No specific questions upon arrival.    Patient is accompained by: Family member    Pertinent History Sarah [name] is a 79 y.o. female with a history of severe Alzheimer's dementia, hypertension who was brought to the ED due to a fall at home. She hit the back of her head resulting in bleeding.  She was treated with staples to close her wound and had a follow-up appointment with her PCP who ordered PT due to fall. Per grandson patient's Alzheimer's has progressed significantly over the last few months. He states that she likes to walk but is unstable. She has had 2 falls in the last 6 months. Per grandson at baseline she is AOx1 and is able to recognize her son and grandson with whom she lives. She does not participate in any regular exercise.    Limitations Walking    Patient Stated Goals Improve balance and safety    Currently in Pain? Yes    Pain Score --   Pt unable to rate   Pain Location Knee    Pain Orientation Right;Left    Pain Descriptors / Indicators Aching    Pain Type Chronic pain    Pain Onset More than a month ago    Pain Frequency Intermittent  TREATMENT   Ther-Ex  Verbal/visual/tactile cues provided, PT also demonstrated/performed exercises with pt to facilitate:  NuStep L2 x30mnutes for warm-up during history taking, cues provided to continue throughout; Precor 40# x 20, 55# 2 x 10; Sit to stand with BUE handheld support x 10; Standing heel raises with BUE handheld support x 10;  Seated clams with manual resistance x 10; Seated adductor squeeze with manual resistance x 10;   Neuromuscular Re-education Red mat balloon taps with grandson x multiple bouts; Standing soccer ball kicks without UE support; Therapist danced with patient to tango music in order to work on balance. Performed side stepping in both directions, forward/backward stepping, 180/360 degree turns, and cross-over steps to  both directions;    Pt educated throughout session about proper posture technique with exercises. Improved exercise technique, movement at target joints, use of target muscles after min to mod verbal, visual, tactile cues.   Patient demonstrates excellent motivation during session today.  Pt requires repeated redirection during session due to advanced dementia. Sherequires creative activities for compliance/performanceso utilized dEngineer, drillingball kicks and dancing.  She is able to perform an additional set of leg presses today during session.  She does complain more frequently during session about knee pain today. Pt encouraged to continue HEP and follow-up as scheduled.She will benefit from PT services to address deficits in strength, balance, and mobility in order to return to full function at home.                                      PT Short Term Goals - 06/27/20 1036      PT SHORT TERM GOAL #1   Title Pt will be participate with HEP at the direction of her caregiver in order to improve strength and balance in order to decrease fall risk and improve function at home.    Baseline 04/04/20: Pt resistant to HEP due to dementia; 8/11: Been doing exercises since Sunday about 20 mins a day.    Time 6    Period Weeks    Status Partially Met    Target Date 07/25/20             PT Long Term Goals - 06/27/20 1036      PT LONG TERM GOAL #1   Title Pt will improve BERG by to at least 40/56 in order to demonstrate clinically significant improvement in balance.    Baseline 10/31/19: 30/56; 01/18/20: 36/56; 04/04/20: 43/56; 06/06/20: 43/56    Time 12    Period Weeks    Status Achieved      PT LONG TERM GOAL #2   Title Pt will decrease TUG to below 14 seconds/decrease in order to demonstrate decreased fall risk.    Baseline 10/31/19: 44.3s; 01/18/20: 32.8s; 04/04/20: 32.1s; 06/06/20: 41.27 secs    Time 12    Period Weeks    Status  Partially Met    Target Date 08/22/20      PT LONG TERM GOAL #3   Title Pt will increase 10MWT to at least 0.50 m/s in order to demonstrate clinically significant improvement in community ambulation.    Baseline 10/31/19: Self-selected: 57.4s =  0.17 m/s; 01/18/20: Self-selected: 29.4s =  0.34 m/s; 04/04/20: Self-selected: 25.6s =  0.39 m/s; 06/06/20: 26.34s = 0.379 m/s;    Time 12    Period Weeks    Status Partially Met  Target Date 08/22/20      PT LONG TERM GOAL #4   Title Pt will be able to perform sit to stand from a regular height chair without UE support in order to demonstrate improved leg strength and safety    Baseline 10/31/19: Heavy UE dependence with sit to stand transfers; 01/18/20: Able to stand without UE reliance however prefers to place hands on arm rests; 04/04/20: Unchanged; 06/06/20: 5TSTS: 17.28 secs with PT supportng patient's hands;    Time 12    Period Weeks    Status Partially Met    Target Date 08/22/20      PT LONG TERM GOAL #5   Title Pt will be able to perform 55# x 10 reps in order to increase strength of LE and improve function at home.    Baseline 8/25: 40# 3 x 20 reps    Time 12    Period Weeks    Status New    Target Date 08/22/20      PT LONG TERM GOAL #6   Title Pt will increase 2MWT by at least 64m(40 ft) in order to demonstrate clinically significant improvement in cardiopulmonary endurance and community ambulation.    Baseline 8/25: 140 feet    Time 12    Period Weeks    Status New    Target Date 08/22/20                 Plan - 07/25/20 1051    Clinical Impression Statement Patient demonstrates excellent motivation during session today.  Pt requires repeated redirection during session due to advanced dementia. She requires creative activities for compliance/performance so utilized dynamic activities like soccer ball kicks and dancing.  She is able to perform an additional set of leg presses today during session.  She does complain more  frequently during session about knee pain today.  Pt encouraged to continue HEP and follow-up as scheduled. She will benefit from PT services to address deficits in strength, balance, and mobility in order to return to full function at home.    Personal Factors and Comorbidities Age;Comorbidity 3+    Comorbidities Alzheimer's, anxiety/depression, arthritis    Examination-Activity Limitations Bathing;Bed Mobility;Caring for Others;Carry;Dressing;Hygiene/Grooming;Lift;Locomotion Level;Squat;Stand;Transfers    Examination-Participation Restrictions Community Activity;Driving;Interpersonal Relationship;Laundry;Medication Management;Meal Prep;Personal Finances;Shop    Stability/Clinical Decision Making Unstable/Unpredictable    Rehab Potential Fair    PT Frequency 1x / week    PT Duration 8 weeks    PT Treatment/Interventions ADLs/Self Care Home Management;Aquatic Therapy;Biofeedback;Canalith Repostioning;Cryotherapy;Electrical Stimulation;Iontophoresis 467mml Dexamethasone;Moist Heat;Traction;Ultrasound;DME Instruction;Gait training;Stair training;Functional mobility training;Therapeutic activities;Therapeutic exercise;Balance training;Neuromuscular re-education;Cognitive remediation;Patient/family education;Manual techniques;Passive range of motion;Dry needling;Vestibular;Joint Manipulations    PT Next Visit Plan Continue with balance and strengthening    PT Home Exercise Plan seated marches, seated clams, seated LAQ, feet together balance, slow marching, sit to stand with minimal UE dependendence    Consulted and Agree with Plan of Care Family member/caregiver    Family Member Consulted GrAdolphus [name]         Patient will benefit from skilled therapeutic intervention in order to improve the following deficits and impairments:  Abnormal gait, Decreased balance, Decreased cognition, Decreased knowledge of precautions, Decreased safety awareness, Decreased strength, Difficulty walking  Visit  Diagnosis: Unsteadiness on feet  History of falling     Problem List Patient Active Problem List   Diagnosis Date Noted  . Mal[place] of moderate degree ([place]/31/20[place]  . Chronic kidney disease due to hypertension 07/24/2019  . Mesenteric artery stenosis (  [place]) 03/24/2019  . Renal artery stenosis ([place]) 01/26/2017  . Personal history of tobacco use, presenting hazards to health 06/23/2016  . Depressive disorder 05/29/2015  . Dementia ([place]) 05/29/2015  . Allergic rhinitis 03/05/2015  . Weak pulse 03/05/2015  . Smokes tobacco daily 03/05/2015  . Generalized anxiety disorder 03/05/2015  . Acid reflux 03/05/2015  . HLD (hyperlipidemia) 03/05/2015  . Affective disorder, major 03/05/2015  . Bad memory 03/05/2015  . Arthritis, degenerative 03/05/2015  . OP (osteoporosis) 03/05/2015  . Renovascular hypertension 03/05/2015  . Scoliosis 03/05/2015  . Avitaminosis D 03/05/2015  . Weight loss 11/07/2014  . Family history of colon cancer 11/07/2014   Sarah [name] PT, DPT, GCS  Sarah [name] 07/25/2020, 10:57 AM  [place] MAIN [facility] SERVICES [address] [place], [place], [address] Phone: [phone]   Fax:  [phone]  Name: Sarah [name] MRN: [id number] Date of Birth: Mar 24, 1941

## 2020-07-27 NOTE — Progress Notes (Signed)
Established patient visit   Patient: Sarah [name]   DOB: 05-23-1941   79 y.o. Female  MRN: [id number] Visit Date: 07/30/2020  Today's healthcare provider: Wilhemena Durie, MD   No chief complaint on file.  Subjective    HPI  Patient comes in today for follow-up of medical problems.  She has Alzheimer's disease without behavioral disturbance.  She still eats fairly well but at times refuses to take her medications.  She does not wander.  She still lives at home but her grandson lives there also.  She has not taken her blood pressure medication today. She is having progressive worsening of bowel and bladder incontinence and wears a depends all the time now.  They are comfortable with this at this time.  Patient Active Problem List   Diagnosis Date Noted  . Malnutrition of moderate degree ([place]) 01/25/2020  . Chronic kidney disease due to hypertension 07/24/2019  . Mesenteric artery stenosis ([place]) 03/24/2019  . Renal artery stenosis ([place]) 01/26/2017  . Personal history of tobacco use, presenting hazards to health 06/23/2016  . Depressive disorder 05/29/2015  . Dementia ([place]) 05/29/2015  . Allergic rhinitis 03/05/2015  . Weak pulse 03/05/2015  . Smokes tobacco daily 03/05/2015  . Generalized anxiety disorder 03/05/2015  . Acid reflux 03/05/2015  . HLD (hyperlipidemia) 03/05/2015  . Affective disorder, major 03/05/2015  . Bad memory 03/05/2015  . Arthritis, degenerative 03/05/2015  . OP (osteoporosis) 03/05/2015  . Renovascular hypertension 03/05/2015  . Scoliosis 03/05/2015  . Avitaminosis D 03/05/2015  . Weight loss 11/07/2014  . Family history of colon cancer 11/07/2014   Past Medical History:  Diagnosis Date  . Arthritis   . Dementia ([place])    ALZHEIMERS  . Ear anomaly    INNER EAR ISSUES  . Hyperlipidemia   . Hypertension    Allergies  Allergen Reactions  . Penicillins Hives and Other (See Comments)    Has patient had a PCN reaction causing immediate  rash, facial/tongue/throat swelling, SOB or lightheadedness with hypotension: No Has patient had a PCN reaction causing severe rash involving mucus membranes or skin necrosis: No Has patient had a PCN reaction that required hospitalization: No Has patient had a PCN reaction occurring within the last 10 years: No  If all of the above answers are "NO", then may proceed with Cephalosporin use.      Medications: Outpatient Medications Prior to Visit  Medication Sig  . acetaminophen (TYLENOL) 500 MG tablet Take 500 mg by mouth at bedtime.   Marland Kitchen CALCIUM CARBONATE-VITAMIN D PO Take 1 tablet by mouth 4 (four) times a week.   . cholecalciferol (VITAMIN D) 1000 units tablet Take 1,000 Units by mouth 4 (four) times a week.   . Cobalamin Combinations (FOLTRATE) 500-1 MCG-MG TABS Take by mouth.   . cyanocobalamin 1000 MCG tablet Take 1,000 mcg by mouth 4 (four) times a week.   Marland Kitchen lisinopril (ZESTRIL) 20 MG tablet Take 1 Tablet By Mouth Daily.  . Probiotic CAPS Take 1 capsule by mouth daily.  . vitamin C (ASCORBIC ACID) 500 MG tablet Take 500 mg by mouth daily.  . vitamin E 400 UNIT capsule Take 400 Units by mouth 4 (four) times a week.    Facility-Administered Medications Prior to Visit  Medication Dose Route Frequency Provider  . lidocaine (PF) (XYLOCAINE) 1 % injection 2 mL  2 mL Intradermal Once Virginia Crews, MD  . lidocaine (PF) (XYLOCAINE) 1 % injection 2 mL  2 mL Intradermal  Once Virginia Crews, MD  . lidocaine (PF) (XYLOCAINE) 1 % injection 2 mL  2 mL Intradermal Once Virginia Crews, MD  . lidocaine (PF) (XYLOCAINE) 1 % injection 2 mL  2 mL Intradermal Once Virginia Crews, MD  . methylPREDNISolone acetate (DEPO-MEDROL) injection 40 mg  40 mg Intramuscular Once Virginia Crews, MD  . methylPREDNISolone acetate (DEPO-MEDROL) injection 40 mg  40 mg Intramuscular Once Virginia Crews, MD    Review of Systems  Constitutional: Negative.  Negative for appetite  change, chills, fatigue and fever.  Respiratory: Negative.  Negative for chest tightness and shortness of breath.   Cardiovascular: Negative.  Negative for chest pain and palpitations.  Gastrointestinal: Negative for abdominal pain, diarrhea (Last week but this has improved. ), nausea and vomiting.  Musculoskeletal: Positive for arthralgias.  Neurological: Negative for dizziness, weakness, light-headedness and headaches.    Last CBC Lab Results  Component Value Date   WBC 8.1 03/22/2020   HGB 10.1 (L) 03/22/2020   HCT 31.5 (L) 03/22/2020   MCV 89 03/22/2020   MCH 28.6 03/22/2020   RDW 13.1 03/22/2020   PLT 207 08/65/7846   Last metabolic panel Lab Results  Component Value Date   GLUCOSE 130 (H) 03/22/2020   NA 137 03/22/2020   K 4.6 03/22/2020   CL 104 03/22/2020   CO2 19 (L) 03/22/2020   BUN 66 (H) 03/22/2020   CREATININE 1.92 (H) 03/22/2020   GFRNONAA 24 (L) 03/22/2020   GFRAA 28 (L) 03/22/2020   CALCIUM 9.3 03/22/2020   PHOS 4.1 07/12/2019   PROT 6.9 03/22/2020   ALBUMIN 4.0 03/22/2020   LABGLOB 2.9 03/22/2020   AGRATIO 1.4 03/22/2020   BILITOT <0.2 03/22/2020   ALKPHOS 57 03/22/2020   AST 12 03/22/2020   ALT 7 03/22/2020   ANIONGAP 7 11/21/2014   Last lipids Lab Results  Component Value Date   CHOL 174 01/12/2017   HDL 61 01/12/2017   LDLCALC 88 01/12/2017   TRIG 123 01/12/2017   Last hemoglobin A1c No results found for: HGBA1C Last thyroid functions Lab Results  Component Value Date   TSH 0.845 03/22/2020   Last vitamin D No results found for: 25OHVITD2, 25OHVITD3, VD25OH Last vitamin B12 and Folate Lab Results  Component Value Date   VITAMINB12 >2000 (H) 05/30/2015    Objective    BP (!) 160/83 (BP Location: Right Arm, Patient Position: Sitting, Cuff Size: Normal)   Pulse (!) 57   Temp 98 F (36.7 C) (Oral)   Wt 102 lb (46.3 kg)   BMI 20.60 kg/m    Physical Exam Vitals reviewed.  Constitutional:      General: She is not in acute  distress.    Appearance: She is not ill-appearing, toxic-appearing or diaphoretic.     Comments: Cachectic female in no acute distress.  HENT:     Head: Normocephalic and atraumatic.     Mouth/Throat:     Mouth: Mucous membranes are moist.     Pharynx: No oropharyngeal exudate.  Cardiovascular:     Rate and Rhythm: Normal rate and regular rhythm.  Pulmonary:     Effort: Pulmonary effort is normal. No respiratory distress.     Breath sounds: Normal breath sounds. No stridor. No wheezing, rhonchi or rales.  Chest:     Chest wall: No tenderness.  Abdominal:     Palpations: Abdomen is soft.  Musculoskeletal:     Cervical back: Normal range of motion.  Skin:  General: Skin is warm and dry.     Capillary Refill: Capillary refill takes less than 2 seconds.  Neurological:     Mental Status: She is alert. Mental status is at baseline.  Psychiatric:        Mood and Affect: Mood normal.        Behavior: Behavior normal.       No results found for any visits on 07/30/20.  Assessment & Plan     1. Late onset Alzheimer's disease without behavioral disturbance (HCC)  Progressive.  MMSE on next visit early next year.  She has had her Covid vaccine I am concerned about possible future behavioral issues. 2. Renovascular hypertension Fair control.  She has not taken her medication today.  3. Osteoporosis, unspecified osteoporosis type, unspecified pathological fracture presence   4. Weight loss Clinically stable.  She has gained 2 pounds since her last visit.  5. Hyperlipidemia, unspecified hyperlipidemia type No treatment going forward  6. Smokes tobacco daily No access to cigarettes anymore.   7.Knee pain Consider topicals such as Voltaren gel.  No follow-ups on file.      I, Wilhemena Durie, MD, have reviewed all documentation for this visit. The documentation on 07/31/20 for the exam, diagnosis, procedures, and orders are all accurate and complete.    Steffie Waggoner  Cranford Mon, MD  [facility] [phone] (phone) [phone] (fax)  [place]

## 2020-07-30 ENCOUNTER — Encounter: Payer: Self-pay | Admitting: Family Medicine

## 2020-07-30 ENCOUNTER — Ambulatory Visit (INDEPENDENT_AMBULATORY_CARE_PROVIDER_SITE_OTHER): Payer: Medicare Other | Admitting: Family Medicine

## 2020-07-30 ENCOUNTER — Other Ambulatory Visit: Payer: Self-pay

## 2020-07-30 VITALS — BP 160/83 | HR 57 | Temp 98.0°F | Wt 102.0 lb

## 2020-07-30 DIAGNOSIS — G301 Alzheimer's disease with late onset: Secondary | ICD-10-CM | POA: Diagnosis not present

## 2020-07-30 DIAGNOSIS — R634 Abnormal weight loss: Secondary | ICD-10-CM | POA: Diagnosis not present

## 2020-07-30 DIAGNOSIS — E785 Hyperlipidemia, unspecified: Secondary | ICD-10-CM | POA: Diagnosis not present

## 2020-07-30 DIAGNOSIS — F0280 Dementia in other diseases classified elsewhere without behavioral disturbance: Secondary | ICD-10-CM

## 2020-07-30 DIAGNOSIS — I150 Renovascular hypertension: Secondary | ICD-10-CM | POA: Diagnosis not present

## 2020-07-30 DIAGNOSIS — F17200 Nicotine dependence, unspecified, uncomplicated: Secondary | ICD-10-CM

## 2020-07-30 DIAGNOSIS — M810 Age-related osteoporosis without current pathological fracture: Secondary | ICD-10-CM

## 2020-08-01 ENCOUNTER — Ambulatory Visit: Payer: Medicare Other | Attending: Family Medicine

## 2020-08-01 ENCOUNTER — Other Ambulatory Visit: Payer: Self-pay

## 2020-08-01 DIAGNOSIS — Z9181 History of falling: Secondary | ICD-10-CM | POA: Insufficient documentation

## 2020-08-01 DIAGNOSIS — R2681 Unsteadiness on feet: Secondary | ICD-10-CM | POA: Diagnosis not present

## 2020-08-01 NOTE — Therapy (Signed)
[place] MAIN [facility] SERVICES [address] [place], [place], [address] Phone: [phone]   Fax:  [phone]  Physical Therapy Treatment  Patient Details  Name: Sarah [name] MRN: [id number] Date of Birth: 08-Jul-1941 Referring Provider (PT): Dr. Rosanna Randy   Encounter Date: 08/01/2020   PT End of Session - 08/01/20 0946    Visit Number 35    Number of Visits 31    Date for PT Re-Evaluation 08/22/20    Authorization Type eval: 10/31/19, PN: 06/20/20; Re-cert: 02/03/43-96/75/91    PT Start Time 6384    PT Stop Time 1016    PT Time Calculation (min) 38 min    Equipment Utilized During Treatment Gait belt    Activity Tolerance Patient tolerated treatment well;Patient limited by fatigue;Patient limited by pain    Behavior During Therapy WFL for tasks assessed/performed           Past Medical History:  Diagnosis Date  . Arthritis   . Dementia ([place])    ALZHEIMERS  . Ear anomaly    INNER EAR ISSUES  . Hyperlipidemia   . Hypertension     Past Surgical History:  Procedure Laterality Date  . APPENDECTOMY    . CATARACT EXTRACTION W/PHACO Right 03/09/2018   Procedure: CATARACT EXTRACTION PHACO AND INTRAOCULAR LENS PLACEMENT (IOC);  Surgeon: Birder Robson, MD;  Location: [facility] ORS;  Service: Ophthalmology;  Laterality: Right;  [place] 00:38.9 AP% 15.9 CDE 6.19 FLUID PACK LOT # [id number]  . CATARACT EXTRACTION W/PHACO Left 06/22/2018   Procedure: CATARACT EXTRACTION PHACO AND INTRAOCULAR LENS PLACEMENT (IOC);  Surgeon: Birder Robson, MD;  Location: [facility] ORS;  Service: Ophthalmology;  Laterality: Left;  [place] 00:50 AP% 16.5 CDE 8.25 Fluid pack lot # [id number]  . CESAREAN SECTION    . CHOLECYSTECTOMY    . CYSTOSCOPY WITH STENT PLACEMENT      There were no vitals filed for this visit.   Subjective Assessment - 08/01/20 0944    Subjective Grandson Sarah [name] report pt a little more lucid and energetic this week. Report she had a good report at PCP,  weight is now up a little.    Patient is accompained by: Family member    Pertinent History Sarah [name] is a 79 y.o. female with a history of severe Alzheimer's dementia, hypertension who was brought to the ED due to a fall at home. She hit the back of her head resulting in bleeding.  She was treated with staples to close her wound and had a follow-up appointment with her PCP who ordered PT due to fall. Per grandson patient's Alzheimer's has progressed significantly over the last few months. He states that she likes to walk but is unstable. She has had 2 falls in the last 6 months. Per grandson at baseline she is AOx1 and is able to recognize her son and grandson with whom she lives. She does not participate in any regular exercise.    Currently in Pain? No/denies          INTERVENTION THIS DATE: -Nustep for AA/ROM and reciprocal central pattern generator stimulation  -AMB to cafeteria and back, continuous with RUE hand held assist, minGuard with gait belt, Sarah [name] provides cues for step length and directional turns -5xSTS from arm chair intermitten thand use -5xSTS from armless chair s UE use (pain is a major limitor at end range Left knee flexion)  -Standing ball toss catch x20 (57f distance)  -standng eyes closed x 30sec (minimal observable sway, minimal  decreased confidence)    PT Short Term Goals - 06/27/20 1036      PT SHORT TERM GOAL #1   Title Pt will be participate with HEP at the direction of her caregiver in order to improve strength and balance in order to decrease fall risk and improve function at home.    Baseline 04/04/20: Pt resistant to HEP due to dementia; 8/11: Been doing exercises since Sunday about 20 mins a day.    Time 6    Period Weeks    Status Partially Met    Target Date 07/25/20             PT Long Term Goals - 06/27/20 1036      PT LONG TERM GOAL #1   Title Pt will improve BERG by to at least 40/56 in order to demonstrate clinically significant improvement  in balance.    Baseline 10/31/19: 30/56; 01/18/20: 36/56; 04/04/20: 43/56; 06/06/20: 43/56    Time 12    Period Weeks    Status Achieved      PT LONG TERM GOAL #2   Title Pt will decrease TUG to below 14 seconds/decrease in order to demonstrate decreased fall risk.    Baseline 10/31/19: 44.3s; 01/18/20: 32.8s; 04/04/20: 32.1s; 06/06/20: 41.27 secs    Time 12    Period Weeks    Status Partially Met    Target Date 08/22/20      PT LONG TERM GOAL #3   Title Pt will increase 10MWT to at least 0.50 m/s in order to demonstrate clinically significant improvement in community ambulation.    Baseline 10/31/19: Self-selected: 57.4s =  0.17 m/s; 01/18/20: Self-selected: 29.4s =  0.34 m/s; 04/04/20: Self-selected: 25.6s =  0.39 m/s; 06/06/20: 26.34s = 0.379 m/s;    Time 12    Period Weeks    Status Partially Met    Target Date 08/22/20      PT LONG TERM GOAL #4   Title Pt will be able to perform sit to stand from a regular height chair without UE support in order to demonstrate improved leg strength and safety    Baseline 10/31/19: Heavy UE dependence with sit to stand transfers; 01/18/20: Able to stand without UE reliance however prefers to place hands on arm rests; 04/04/20: Unchanged; 06/06/20: 5TSTS: 17.28 secs with PT supportng patient's hands;    Time 12    Period Weeks    Status Partially Met    Target Date 08/22/20      PT LONG TERM GOAL #5   Title Pt will be able to perform 55# x 10 reps in order to increase strength of LE and improve function at home.    Baseline 8/25: 40# 3 x 20 reps    Time 12    Period Weeks    Status New    Target Date 08/22/20      PT LONG TERM GOAL #6   Title Pt will increase 2MWT by at least 51m(40 ft) in order to demonstrate clinically significant improvement in cardiopulmonary endurance and community ambulation.    Baseline 8/25: 140 feet    Time 12    Period Weeks    Status New    Target Date 08/22/20                 Plan - 08/01/20 1024    Clinical  Impression Statement Continued to progress pt tolerance to sustained activity. Pt requires extensive cues thoughout as per baseline due to  dementia, although she remains pleasant and engaged. Chronic knee DJD remains a stable limitations in session, but does not lead to degradation of motor control, gait speed, or tolerance during 22 minute walking period. Pt participates with static balance training activty, shoes an impressive lack of postural sway in eyes closed standing condition, and close to normal reacticion time in ball toss/catch, but coordination required for catching is challenged adequately. MinGuard proivded for ball toss/catch, however pt appears to have no significant challenge to steadiness. Pt continues to make gradual but steady progress toward goals overall.    Personal Factors and Comorbidities Age;Comorbidity 3+    Comorbidities Alzheimer's, anxiety/depression, arthritis    Examination-Activity Limitations Bathing;Bed Mobility;Caring for Others;Carry;Dressing;Hygiene/Grooming;Lift;Locomotion Level;Squat;Stand;Transfers    Examination-Participation Restrictions Community Activity;Driving;Interpersonal Relationship;Laundry;Medication Management;Meal Prep;Personal Finances;Shop    Stability/Clinical Decision Making Unstable/Unpredictable    Clinical Decision Making High    Rehab Potential Fair    PT Frequency 1x / week    PT Duration 8 weeks    PT Treatment/Interventions ADLs/Self Care Home Management;Aquatic Therapy;Biofeedback;Canalith Repostioning;Cryotherapy;Electrical Stimulation;Iontophoresis 45m/ml Dexamethasone;Moist Heat;Traction;Ultrasound;DME Instruction;Gait training;Stair training;Functional mobility training;Therapeutic activities;Therapeutic exercise;Balance training;Neuromuscular re-education;Cognitive remediation;Patient/family education;Manual techniques;Passive range of motion;Dry needling;Vestibular;Joint Manipulations    PT Next Visit Plan Continue with balance and  strengthening    PT Home Exercise Plan seated marches, seated clams, seated LAQ, feet together balance, slow marching, sit to stand with minimal UE dependendence    Consulted and Agree with Plan of Care Family member/caregiver    Family Member Consulted Sarah [name]          Patient will benefit from skilled therapeutic intervention in order to improve the following deficits and impairments:  Abnormal gait, Decreased balance, Decreased cognition, Decreased knowledge of precautions, Decreased safety awareness, Decreased strength, Difficulty walking  Visit Diagnosis: Unsteadiness on feet  History of falling     Problem List Patient Active Problem List   Diagnosis Date Noted  . Malnutrition of moderate degree ([place] 01/25/2020  . Chronic kidney disease due to hypertension 07/24/2019  . Mesenteric artery stenosis ([place] 03/24/2019  . Renal artery stenosis ([place] 01/26/2017  . Personal history of tobacco use, presenting hazards to health 06/23/2016  . Depressive disorder 05/29/2015  . Dementia ([place] 05/29/2015  . Allergic rhinitis 03/05/2015  . Weak pulse 03/05/2015  . Smokes tobacco daily 03/05/2015  . Generalized anxiety disorder 03/05/2015  . Acid reflux 03/05/2015  . HLD (hyperlipidemia) 03/05/2015  . Affective disorder, major 03/05/2015  . Bad memory 03/05/2015  . Arthritis, degenerative 03/05/2015  . OP (osteoporosis) 03/05/2015  . Renovascular hypertension 03/05/2015  . Scoliosis 03/05/2015  . Avitaminosis D 03/05/2015  . Weight loss 11/07/2014  . Family history of colon cancer 11/07/2014   10:34 AM, 08/01/20 AEtta [name] PT, DPT Physical Therapist - [facility] Physical [phone]    BEtta Grandchild10/03/2020, 10:30 AM  C[facility]rianMAIN [facility] [address] [address]Phone: [phone]  Fax:  [phone] Name: MMADI BONFIGLIOMRN: [id number] of Birth: [birth year]

## 2020-08-08 ENCOUNTER — Ambulatory Visit: Payer: Medicare Other

## 2020-08-08 ENCOUNTER — Other Ambulatory Visit: Payer: Self-pay

## 2020-08-08 DIAGNOSIS — R2681 Unsteadiness on feet: Secondary | ICD-10-CM | POA: Diagnosis not present

## 2020-08-08 DIAGNOSIS — Z9181 History of falling: Secondary | ICD-10-CM

## 2020-08-08 NOTE — Therapy (Signed)
[place] MAIN [facility] SERVICES [address] [place], [place], [address] Phone: [phone]   Fax:  [phone]  Physical Therapy Treatment  Patient Details  Name: Sarah [name] MRN: [id number] Date of Birth: 05-Jan-1941 Referring Provider (PT): Dr. Rosanna Randy   Encounter Date: 08/08/2020   PT End of Session - 08/08/20 1315    Visit Number 36    Number of Visits 41    Date for PT Re-Evaluation 08/22/20    Authorization Type eval: 10/31/19, PN: 06/20/20; Re-cert: 06/30/57-59/29/24    PT Start Time 1310    PT Stop Time 1345    PT Time Calculation (min) 35 min    Equipment Utilized During Treatment Gait belt    Activity Tolerance Patient tolerated treatment well;Patient limited by fatigue;Patient limited by pain    Behavior During Therapy WFL for tasks assessed/performed           Past Medical History:  Diagnosis Date  . Arthritis   . Dementia ([place])    ALZHEIMERS  . Ear anomaly    INNER EAR ISSUES  . Hyperlipidemia   . Hypertension     Past Surgical History:  Procedure Laterality Date  . APPENDECTOMY    . CATARACT EXTRACTION W/PHACO Right 03/09/2018   Procedure: CATARACT EXTRACTION PHACO AND INTRAOCULAR LENS PLACEMENT (IOC);  Surgeon: Birder Robson, MD;  Location: [facility] ORS;  Service: Ophthalmology;  Laterality: Right;  [place] 00:38.9 AP% 15.9 CDE 6.19 FLUID PACK LOT # [id number]  . CATARACT EXTRACTION W/PHACO Left 06/22/2018   Procedure: CATARACT EXTRACTION PHACO AND INTRAOCULAR LENS PLACEMENT (IOC);  Surgeon: Birder Robson, MD;  Location: [facility] ORS;  Service: Ophthalmology;  Laterality: Left;  [place] 00:50 AP% 16.5 CDE 8.25 Fluid pack lot # [id number] H  . CESAREAN SECTION    . CHOLECYSTECTOMY    . CYSTOSCOPY WITH STENT PLACEMENT      There were no vitals filed for this visit.   Subjective Assessment - 08/08/20 1312    Subjective Pt is doing well today. She has been tired recently with poor sleep. She continues to complain of knee pain.  No falls since last therapy session. No specific questions or concerns.    Patient is accompained by: Family member    Pertinent History Sarah [name] is a 79 y.o. female with a history of severe Alzheimer's dementia, hypertension who was brought to the ED due to a fall at home. She hit the back of her head resulting in bleeding.  She was treated with staples to close her wound and had a follow-up appointment with her PCP who ordered PT due to fall. Per grandson patient's Alzheimer's has progressed significantly over the last few months. He states that she likes to walk but is unstable. She has had 2 falls in the last 6 months. Per grandson at baseline she is AOx1 and is able to recognize her son and grandson with whom she lives. She does not participate in any regular exercise.    Patient Stated Goals Improve balance and safety    Currently in Pain? No/denies              TREATMENT   Ther-Ex  Verbal/visual/tactile cues provided, PT also demonstrated/performed exercises with pt to facilitate:  Precor 40# x 20, 60# x 20, 70# x 20; Sit to stand with BUE handheld support x 10; Standing heel raises with BUE handheld support x 10;  Seated clams with manual resistance x 20; Seated adductor squeeze with manual resistance x 20;  6" alternating step ups x 10 total with handheld support;  Neuromuscular Re-education High knee marching walking in rehab gym x 50'; Alternating 6" step taps with handheld support x 10 each; Hurdle step-overs (3, 1/2 foam rollers) x 2 trials; Hurdle step up and over (3, 1/2 foam rollers) x 2 trials; Standing soccer ball kicks without UE support with therapist providing CGA and grandson kicking, varying right and left sides; Therapist danced with patient to tango music in order to work on balance. Performed side stepping in both directions, forward/backward stepping, 180/360 degree turns, and cross-over steps to both directions;   Pt educated throughout  session about proper posture technique with exercises. Improved exercise technique, movement at target joints, use of target muscles after min to mod verbal, visual, tactile cues.   Patient demonstrates excellent motivation during session today.  Pt requires repeated redirection during session due to advanced dementia. She arrived late so session was slightly abbreviated. Sherequires creative activities for compliance/performanceso utilized Engineer, drilling ball kicks and dancing. She is able to perform an additional weight and repetitions of leg press on this date. She does not complain about her knee pain during session today.Pt encouraged to continue HEP and follow-up as scheduled.Shewill benefit from PT services to address deficits in strength, balance, and mobility in order to return to full function at home.                            PT Short Term Goals - 06/27/20 1036      PT SHORT TERM GOAL #1   Title Pt will be participate with HEP at the direction of her caregiver in order to improve strength and balance in order to decrease fall risk and improve function at home.    Baseline 04/04/20: Pt resistant to HEP due to dementia; 8/11: Been doing exercises since Sunday about 20 mins a day.    Time 6    Period Weeks    Status Partially Met    Target Date 07/25/20             PT Long Term Goals - 06/27/20 1036      PT LONG TERM GOAL #1   Title Pt will improve BERG by to at least 40/56 in order to demonstrate clinically significant improvement in balance.    Baseline 10/31/19: 30/56; 01/18/20: 36/56; 04/04/20: 43/56; 06/06/20: 43/56    Time 12    Period Weeks    Status Achieved      PT LONG TERM GOAL #2   Title Pt will decrease TUG to below 14 seconds/decrease in order to demonstrate decreased fall risk.    Baseline 10/31/19: 44.3s; 01/18/20: 32.8s; 04/04/20: 32.1s; 06/06/20: 41.27 secs    Time 12    Period Weeks    Status Partially Met     Target Date 08/22/20      PT LONG TERM GOAL #3   Title Pt will increase 10MWT to at least 0.50 m/s in order to demonstrate clinically significant improvement in community ambulation.    Baseline 10/31/19: Self-selected: 57.4s =  0.17 m/s; 01/18/20: Self-selected: 29.4s =  0.34 m/s; 04/04/20: Self-selected: 25.6s =  0.39 m/s; 06/06/20: 26.34s = 0.379 m/s;    Time 12    Period Weeks    Status Partially Met    Target Date 08/22/20      PT LONG TERM GOAL #4   Title Pt will be able to perform sit to stand  from a regular height chair without UE support in order to demonstrate improved leg strength and safety    Baseline 10/31/19: Heavy UE dependence with sit to stand transfers; 01/18/20: Able to stand without UE reliance however prefers to place hands on arm rests; 04/04/20: Unchanged; 06/06/20: 5TSTS: 17.28 secs with PT supportng patient's hands;    Time 12    Period Weeks    Status Partially Met    Target Date 08/22/20      PT LONG TERM GOAL #5   Title Pt will be able to perform 55# x 10 reps in order to increase strength of LE and improve function at home.    Baseline 8/25: 40# 3 x 20 reps    Time 12    Period Weeks    Status New    Target Date 08/22/20      PT LONG TERM GOAL #6   Title Pt will increase 2MWT by at least 67m(40 ft) in order to demonstrate clinically significant improvement in cardiopulmonary endurance and community ambulation.    Baseline 8/25: 140 feet    Time 12    Period Weeks    Status New    Target Date 08/22/20                 Plan - 08/08/20 1315    Clinical Impression Statement Patient demonstrates excellent motivation during session today.  Pt requires repeated redirection during session due to advanced dementia. She arrived late so session was slightly abbreviated. She requires creative activities for compliance/performance so utilized dynamic activities like soccer ball kicks and dancing. She is able to perform an additional weight and repetitions of leg press  on this date. She does not complain about her knee pain during session today. Pt encouraged to continue HEP and follow-up as scheduled. She will benefit from PT services to address deficits in strength, balance, and mobility in order to return to full function at home.    Personal Factors and Comorbidities Age;Comorbidity 3+    Comorbidities Alzheimer's, anxiety/depression, arthritis    Examination-Activity Limitations Bathing;Bed Mobility;Caring for Others;Carry;Dressing;Hygiene/Grooming;Lift;Locomotion Level;Squat;Stand;Transfers    Examination-Participation Restrictions Community Activity;Driving;Interpersonal Relationship;Laundry;Medication Management;Meal Prep;Personal Finances;Shop    Stability/Clinical Decision Making Unstable/Unpredictable    Rehab Potential Fair    PT Frequency 1x / week    PT Duration 8 weeks    PT Treatment/Interventions ADLs/Self Care Home Management;Aquatic Therapy;Biofeedback;Canalith Repostioning;Cryotherapy;Electrical Stimulation;Iontophoresis 465mml Dexamethasone;Moist Heat;Traction;Ultrasound;DME Instruction;Gait training;Stair training;Functional mobility training;Therapeutic activities;Therapeutic exercise;Balance training;Neuromuscular re-education;Cognitive remediation;Patient/family education;Manual techniques;Passive range of motion;Dry needling;Vestibular;Joint Manipulations    PT Next Visit Plan Continue with balance and strengthening    PT Home Exercise Plan seated marches, seated clams, seated LAQ, feet together balance, slow marching, sit to stand with minimal UE dependendence    Consulted and Agree with Plan of Care Family member/caregiver    Family Member Consulted GrAdolphus Birchwood         Patient will benefit from skilled therapeutic intervention in order to improve the following deficits and impairments:  Abnormal gait, Decreased balance, Decreased cognition, Decreased knowledge of precautions, Decreased safety awareness, Decreased strength,  Difficulty walking  Visit Diagnosis: Unsteadiness on feet  History of falling     Problem List Patient Active Problem List   Diagnosis Date Noted  . Malnutrition of moderate degree (HCPark [place]03/31/2021  . Chronic kidney disease due to hypertension 07/24/2019  . Mesenteric artery stenosis (HCE[place]05/28/2020  . Renal artery stenosis (HCM[place]04/11/2016  . Personal history of tobacco use, presenting hazards  to health 06/23/2016  . Depressive disorder 05/29/2015  . Dementia ([place]) 05/29/2015  . Allergic rhinitis 03/05/2015  . Weak pulse 03/05/2015  . Smokes tobacco daily 03/05/2015  . Generalized anxiety disorder 03/05/2015  . Acid reflux 03/05/2015  . HLD (hyperlipidemia) 03/05/2015  . Affective disorder, major 03/05/2015  . Bad memory 03/05/2015  . Arthritis, degenerative 03/05/2015  . OP (osteoporosis) 03/05/2015  . Renovascular hypertension 03/05/2015  . Scoliosis 03/05/2015  . Avitaminosis D 03/05/2015  . Weight loss 11/07/2014  . Family history of colon cancer 11/07/2014    Daxson Reffett 08/08/2020, 1:59 PM  [place] MAIN [facility] SERVICES [address] [place], [place], [address] Phone: [phone]   Fax:  [phone]  Name: Sarah [name] MRN: [id number] Date of Birth: 03-Apr-1941

## 2020-08-15 ENCOUNTER — Other Ambulatory Visit: Payer: Self-pay

## 2020-08-15 ENCOUNTER — Ambulatory Visit: Payer: Medicare Other

## 2020-08-15 DIAGNOSIS — R2681 Unsteadiness on feet: Secondary | ICD-10-CM | POA: Diagnosis not present

## 2020-08-15 DIAGNOSIS — Z9181 History of falling: Secondary | ICD-10-CM

## 2020-08-15 NOTE — Therapy (Signed)
[place] [facility] [place] MAIN [facility] SERVICES [address] [place], [place], [address] Phone: [phone]   Fax:  [phone]  Physical Therapy Treatment  Patient Details  Name: Sarah [name] MRN: [id number] Date of Birth: 1941/05/02 Referring Provider (PT): Dr. Rosanna Randy   Encounter Date: 08/15/2020   PT End of Session - 08/15/20 0940    Visit Number 37    Number of Visits 56    Date for PT Re-Evaluation 08/22/20    Authorization Type eval: 10/31/19, PN: 06/20/20; Re-cert: 0/1/75-08/20/84    PT Start Time 0936    PT Stop Time 1015    PT Time Calculation (min) 39 min    Equipment Utilized During Treatment Gait belt    Activity Tolerance Patient tolerated treatment well;Patient limited by fatigue;Patient limited by pain    Behavior During Therapy WFL for tasks assessed/performed           Past Medical History:  Diagnosis Date  . Arthritis   . Dementia ([place])    ALZHEIMERS  . Ear anomaly    INNER EAR ISSUES  . Hyperlipidemia   . Hypertension     Past Surgical History:  Procedure Laterality Date  . APPENDECTOMY    . CATARACT EXTRACTION W/PHACO Right 03/09/2018   Procedure: CATARACT EXTRACTION PHACO AND INTRAOCULAR LENS PLACEMENT (IOC);  Surgeon: Birder Robson, MD;  Location: [facility] ORS;  Service: Ophthalmology;  Laterality: Right;  [place] 00:38.9 AP% 15.9 CDE 6.19 FLUID PACK LOT # [id number] H  . CATARACT EXTRACTION W/PHACO Left 06/22/2018   Procedure: CATARACT EXTRACTION PHACO AND INTRAOCULAR LENS PLACEMENT (IOC);  Surgeon: Birder Robson, MD;  Location: [facility] ORS;  Service: Ophthalmology;  Laterality: Left;  [place] 00:50 AP% 16.5 CDE 8.25 Fluid pack lot # [id number] H  . CESAREAN SECTION    . CHOLECYSTECTOMY    . CYSTOSCOPY WITH STENT PLACEMENT      There were no vitals filed for this visit.   Subjective Assessment - 08/15/20 0940    Subjective Pt is doing well today.  She has had a good week with a good appetite and upbeat spirit. She continues to  complain of knee pain intermittently. No falls since last therapy session. No specific questions or concerns.    Patient is accompained by: Family member    Pertinent History Sarah [name] is a 79 y.o. female with a history of severe Alzheimer's dementia, hypertension who was brought to the ED due to a fall at home. She hit the back of her head resulting in bleeding.  She was treated with staples to close her wound and had a follow-up appointment with her PCP who ordered PT due to fall. Per grandson patient's Alzheimer's has progressed significantly over the last few months. He states that she likes to walk but is unstable. She has had 2 falls in the last 6 months. Per grandson at baseline she is AOx1 and is able to recognize her son and grandson with whom she lives. She does not participate in any regular exercise.    Patient Stated Goals Improve balance and safety    Currently in Pain? No/denies            TREATMENT   Ther-Ex  Verbal/visual/tactile cues provided, PT also demonstrated/performed exercises with pt to facilitate:  Precor BLE leg press 60# x 20, 70# x 10, x 13 ; Sit to stand with BUE handheld support x 10, added 2kg med ball with an additional 10 repetitions;  Seated clams with manual resistance 2 x  10; Seated adductor squeeze with manual resistance 2 x 10; Seated marches with manual resistance 2 x 10;  Standing 2kg med ball tosses to grandson x 5 underhand, x 10 chest passes, and x 10 vertical tosses as high as possible, grandson gradually increases distance away from pt to challenge patient's power;    Neuromuscular Re-education Obstacle course walks with objects of increasing height 1/2 foam roller, foam roller, and 8" bolster, pt requires single hand held assist and CGA from therapist as well as cues from therapist and grandson to perform; Therapist danced with patient to tango music in order to work on balance. Performed side stepping in both directions,  forward/backward stepping, 180/360 degree turns, and cross-over steps to both directions;   Pt educated throughout session about proper posture technique with exercises. Improved exercise technique, movement at target joints, use of target muscles after min to mod verbal, visual, tactile cues.   Patient demonstrates excellent motivation during session today.  Pt requires repeated redirection during session due to advanced dementia. Sherequires creative activities for [employer] utilized dynamic activities likeball toesses and dancing. She is able to perform an additional weight and repetitions of leg press again on this date demonstrating improving LE strength. She does not complain about her knee pain during session today.  She will need updated outcome measures, goals, and recertification next visit.  Current plan is to continue therapy until mid November at which time will be considered whether patient might benefit from a break to assess response and carryover at home. Pt encouraged to continue HEP and follow-up as scheduled.Shewill benefit from PT services to address deficits in strength, balance, and mobility in order to return to full function at home.                               PT Short Term Goals - 06/27/20 1036      PT SHORT TERM GOAL #1   Title Pt will be participate with HEP at the direction of her caregiver in order to improve strength and balance in order to decrease fall risk and improve function at home.    Baseline 04/04/20: Pt resistant to HEP due to dementia; 8/11: Been doing exercises since Sunday about 20 mins a day.    Time 6    Period Weeks    Status Partially Met    Target Date 07/25/20             PT Long Term Goals - 06/27/20 1036      PT LONG TERM GOAL #1   Title Pt will improve BERG by to at least 40/56 in order to demonstrate clinically significant improvement in balance.    Baseline 10/31/19: 30/56;  01/18/20: 36/56; 04/04/20: 43/56; 06/06/20: 43/56    Time 12    Period Weeks    Status Achieved      PT LONG TERM GOAL #2   Title Pt will decrease TUG to below 14 seconds/decrease in order to demonstrate decreased fall risk.    Baseline 10/31/19: 44.3s; 01/18/20: 32.8s; 04/04/20: 32.1s; 06/06/20: 41.27 secs    Time 12    Period Weeks    Status Partially Met    Target Date 08/22/20      PT LONG TERM GOAL #3   Title Pt will increase 10MWT to at least 0.50 m/s in order to demonstrate clinically significant improvement in community ambulation.    Baseline 10/31/19: Self-selected: 57.4s =  0.17  m/s; 01/18/20: Self-selected: 29.4s =  0.34 m/s; 04/04/20: Self-selected: 25.6s =  0.39 m/s; 06/06/20: 26.34s = 0.379 m/s;    Time 12    Period Weeks    Status Partially Met    Target Date 08/22/20      PT LONG TERM GOAL #4   Title Pt will be able to perform sit to stand from a regular height chair without UE support in order to demonstrate improved leg strength and safety    Baseline 10/31/19: Heavy UE dependence with sit to stand transfers; 01/18/20: Able to stand without UE reliance however prefers to place hands on arm rests; 04/04/20: Unchanged; 06/06/20: 5TSTS: 17.28 secs with PT supportng patient's hands;    Time 12    Period Weeks    Status Partially Met    Target Date 08/22/20      PT LONG TERM GOAL #5   Title Pt will be able to perform 55# x 10 reps in order to increase strength of LE and improve function at home.    Baseline 8/25: 40# 3 x 20 reps    Time 12    Period Weeks    Status New    Target Date 08/22/20      PT LONG TERM GOAL #6   Title Pt will increase 2MWT by at least 58m(40 ft) in order to demonstrate clinically significant improvement in cardiopulmonary endurance and community ambulation.    Baseline 8/25: 140 feet    Time 12    Period Weeks    Status New    Target Date 08/22/20                 Plan - 08/15/20 01031   Clinical Impression Statement Patient demonstrates  excellent motivation during session today.  Pt requires repeated redirection during session due to advanced dementia. She requires creative activities for compliance/performance so utilized dynamic activities like ball toesses and dancing. She is able to perform an additional weight and repetitions of leg press again on this date demonstrating improving LE strength. She does not complain about her knee pain during session today.  She will need updated outcome measures, goals, and recertification next visit.  Current plan is to continue therapy until mid November at which time will be considered whether patient might benefit from a break to assess response and carryover at home.  Pt encouraged to continue HEP and follow-up as scheduled. She will benefit from PT services to address deficits in strength, balance, and mobility in order to return to full function at home.    Personal Factors and Comorbidities Age;Comorbidity 3+    Comorbidities Alzheimer's, anxiety/depression, arthritis    Examination-Activity Limitations Bathing;Bed Mobility;Caring for Others;Carry;Dressing;Hygiene/Grooming;Lift;Locomotion Level;Squat;Stand;Transfers    Examination-Participation Restrictions Community Activity;Driving;Interpersonal Relationship;Laundry;Medication Management;Meal Prep;Personal Finances;Shop    Stability/Clinical Decision Making Unstable/Unpredictable    Rehab Potential Fair    PT Frequency 1x / week    PT Duration 8 weeks    PT Treatment/Interventions ADLs/Self Care Home Management;Aquatic Therapy;Biofeedback;Canalith Repostioning;Cryotherapy;Electrical Stimulation;Iontophoresis 469mml Dexamethasone;Moist Heat;Traction;Ultrasound;DME Instruction;Gait training;Stair training;Functional mobility training;Therapeutic activities;Therapeutic exercise;Balance training;Neuromuscular re-education;Cognitive remediation;Patient/family education;Manual techniques;Passive range of motion;Dry needling;Vestibular;Joint  Manipulations    PT Next Visit Plan Update outcome measures and goals, recertification; continue with balance and strengthening    PT Home Exercise Plan seated marches, seated clams, seated LAQ, feet together balance, slow marching, sit to stand with minimal UE dependendence    Consulted and Agree with Plan of Care Family member/caregiver    Family Member Consulted GrQuincy  Patient will benefit from skilled therapeutic intervention in order to improve the following deficits and impairments:  Abnormal gait, Decreased balance, Decreased cognition, Decreased knowledge of precautions, Decreased safety awareness, Decreased strength, Difficulty walking  Visit Diagnosis: Unsteadiness on feet  History of falling     Problem List Patient Active Problem List   Diagnosis Date Noted  . Malnutrition of moderate degree ([place]) 01/25/2020  . Chronic kidney disease due to hypertension 07/24/2019  . Mesenteric artery stenosis ([place]) 03/24/2019  . Renal artery stenosis ([place]) 01/26/2017  . Personal history of tobacco use, presenting hazards to health 06/23/2016  . Depressive disorder 05/29/2015  . Dementia ([place]) 05/29/2015  . Allergic rhinitis 03/05/2015  . Weak pulse 03/05/2015  . Smokes tobacco daily 03/05/2015  . Generalized anxiety disorder 03/05/2015  . Acid reflux 03/05/2015  . HLD (hyperlipidemia) 03/05/2015  . Affective disorder, major 03/05/2015  . Bad memory 03/05/2015  . Arthritis, degenerative 03/05/2015  . OP (osteoporosis) 03/05/2015  . Renovascular hypertension 03/05/2015  . Scoliosis 03/05/2015  . Avitaminosis D 03/05/2015  . Weight loss 11/07/2014  . Family history of colon cancer 11/07/2014   Phillips Grout PT, DPT, GCS  Thurma Priego 08/15/2020, 10:49 AM  [place] MAIN [facility] SERVICES [address] [place], [place], [address] Phone: [phone]   Fax:  [phone]  Name: Sarah [name] MRN: [id number] Date of  Birth: [birth year]

## 2020-08-22 ENCOUNTER — Other Ambulatory Visit: Payer: Self-pay

## 2020-08-22 ENCOUNTER — Ambulatory Visit: Payer: Medicare Other

## 2020-08-22 DIAGNOSIS — R2681 Unsteadiness on feet: Secondary | ICD-10-CM | POA: Diagnosis not present

## 2020-08-22 DIAGNOSIS — Z9181 History of falling: Secondary | ICD-10-CM

## 2020-08-22 NOTE — Addendum Note (Signed)
Addended by: Roxana Hires D on: 08/22/2020 02:26 PM   Modules accepted: Orders

## 2020-08-22 NOTE — Therapy (Signed)
[place] MAIN [facility] SERVICES [address] [place], [place], [address] Phone: [phone]   Fax:  [phone]  Physical Therapy Treatment/Recertification  Patient Details  Name: Sarah [name] MRN: [id number] Date of Birth: 1941-07-01 Referring Provider (PT): Dr. Rosanna Randy   Encounter Date: 08/22/2020   PT End of Session - 08/22/20 1022    Visit Number 38    Number of Visits 65    Date for PT Re-Evaluation 10/17/20    Authorization Type eval: 10/31/19, PN: 06/20/20; Re-cert: 02/29/72-22/02/54    PT Start Time 0940    PT Stop Time 1015    PT Time Calculation (min) 35 min    Equipment Utilized During Treatment Gait belt    Activity Tolerance Patient tolerated treatment well;Patient limited by fatigue;Patient limited by pain    Behavior During Therapy WFL for tasks assessed/performed           Past Medical History:  Diagnosis Date   Arthritis    Dementia ([place])    ALZHEIMERS   Ear anomaly    INNER EAR ISSUES   Hyperlipidemia    Hypertension     Past Surgical History:  Procedure Laterality Date   APPENDECTOMY     CATARACT EXTRACTION W/PHACO Right 03/09/2018   Procedure: CATARACT EXTRACTION PHACO AND INTRAOCULAR LENS PLACEMENT (IOC);  Surgeon: Birder Robson, MD;  Location: [facility] ORS;  Service: Ophthalmology;  Laterality: Right;  [place] 00:38.9 AP% 15.9 CDE 6.19 FLUID PACK LOT # [id number]   CATARACT EXTRACTION W/PHACO Left 06/22/2018   Procedure: CATARACT EXTRACTION PHACO AND INTRAOCULAR LENS PLACEMENT (IOC);  Surgeon: Birder Robson, MD;  Location: [facility] ORS;  Service: Ophthalmology;  Laterality: Left;  [place] 00:50 AP% 16.5 CDE 8.25 Fluid pack lot # [id number]   CESAREAN SECTION     CHOLECYSTECTOMY     CYSTOSCOPY WITH STENT PLACEMENT      There were no vitals filed for this visit.   Subjective Assessment - 08/22/20 1011    Subjective Pt is doing well today and grandson reports she has had a good week. She continues to  complain of knee pain intermittently like always. No falls since last therapy session. No specific questions or concerns.    Patient is accompained by: Family member    Pertinent History Sarah [name] is a 79 y.o. female with a history of severe Alzheimer's dementia, hypertension who was brought to the ED due to a fall at home. She hit the back of her head resulting in bleeding.  She was treated with staples to close her wound and had a follow-up appointment with her PCP who ordered PT due to fall. Per grandson patient's Alzheimer's has progressed significantly over the last few months. He states that she likes to walk but is unstable. She has had 2 falls in the last 6 months. Per grandson at baseline she is AOx1 and is able to recognize her son and grandson with whom she lives. She does not participate in any regular exercise.    Patient Stated Goals Improve balance and safety    Currently in Pain? No/denies                [facility] PT Assessment - 08/22/20 1009      Standardized Balance Assessment   Standardized Balance Assessment Berg Balance Test      Berg Balance Test   Sit to Stand Able to stand without using hands and stabilize independently    Standing Unsupported Able to stand safely 2 minutes  Sitting with Back Unsupported but Feet Supported on Floor or Stool Able to sit safely and securely 2 minutes    Stand to Sit Controls descent by using hands    Transfers Able to transfer safely, definite need of hands    Standing Unsupported with Eyes Closed Able to stand 10 seconds safely    Standing Unsupported with Feet Together Needs help to attain position but able to stand for 30 seconds with feet together    From Standing, Reach Forward with Outstretched Arm Can reach forward >12 cm safely (5")    From Standing Position, Pick up Object from Floor Able to pick up shoe safely and easily    From Standing Position, Turn to Look Behind Over each Shoulder Looks behind from both sides  and weight shifts well    Turn 360 Degrees Able to turn 360 degrees safely but slowly    Standing Unsupported, Alternately Place Feet on Step/Stool Able to complete 4 steps without aid or supervision    Standing Unsupported, One Foot in Front Able to take small step independently and hold 30 seconds    Standing on One Leg Tries to lift leg/unable to hold 3 seconds but remains standing independently    Total Score 41              TREATMENT   Neuromuscular Re-education Performed outcome measures and updated goals: TUG: 33.6 sec 5TSTS: 15.3s with PT supporting patient's hands 54mgait speed: 20.3 secs = 0.49 m/s  BERG: 41/56  2MWT: 175' Therapist danced with patient to tango music in order to work on balance. Performed side stepping in both directions, forward/backward stepping, 180/360 degree turns, and cross-over steps to both directions;    Pt educated throughout session about proper posture and technique with exercises. Improved exercise technique, movement at target joints, use of target muscles after min to mod verbal, visual, tactile cues.     Patient arrived late, so session was abbreviated slightly. Updated goals and outcome measures today. Pt improved in her TUG, 5TSTS, 152mait speed, and 2MWT values. Patient's cognitive awareness is a limiting factor when performing outcome measures. Overall however she is showing improvement in function and grandson reports that therapy has been helpful at preventing any major falls. The current plan is to continue for an additional few weeks and then discharge prior to the holidays to assess for carryover and independence with HEP. Pt will benefit from PT services to address deficits in strength, balance, and mobility in order to return to full function at home.                        PT Short Term Goals - 08/22/20 1023      PT SHORT TERM GOAL #1   Title Pt will be participate with HEP at the direction of her  caregiver in order to improve strength and balance in order to decrease fall risk and improve function at home.    Baseline 04/04/20: Pt resistant to HEP due to dementia; 8/11: Been doing exercises since Sunday about 20 mins a day.; 08/22/20: Intermittent agreement to perform by patient due to dementia    Time 4    Period Weeks    Status Partially Met    Target Date 09/19/20             PT Long Term Goals - 08/22/20 1024      PT LONG TERM GOAL #1   Title Pt will  improve BERG by to at least 40/56 in order to demonstrate clinically significant improvement in balance.    Baseline 10/31/19: 30/56; 01/18/20: 36/56; 04/04/20: 43/56; 06/06/20: 43/56; 08/22/20: 41/56    Time 12    Period Weeks    Status Achieved      PT LONG TERM GOAL #2   Title Pt will decrease TUG to below 14 seconds/decrease in order to demonstrate decreased fall risk.    Baseline 10/31/19: 44.3s; 01/18/20: 32.8s; 04/04/20: 32.1s; 06/06/20: 41.27 secs; 08/22/20: 33.6s    Time 12    Period Weeks    Status Partially Met    Target Date 10/17/20      PT LONG TERM GOAL #3   Title Pt will increase 10MWT to at least 0.50 m/s in order to demonstrate clinically significant improvement in community ambulation.    Baseline 10/31/19: Self-selected: 57.4s =  0.17 m/s; 01/18/20: Self-selected: 29.4s =  0.34 m/s; 04/04/20: Self-selected: 25.6s =  0.39 m/s; 06/06/20: 26.34s = 0.379 m/s; 08/22/20: 20.3s = 0.49 m/s    Time 12    Period Weeks    Status Partially Met    Target Date 10/17/20      PT LONG TERM GOAL #4   Title Pt will be able to perform sit to stand from a regular height chair without UE support in order to demonstrate improved leg strength and safety    Baseline 10/31/19: Heavy UE dependence with sit to stand transfers; 01/18/20: Able to stand without UE reliance however prefers to place hands on arm rests; 04/04/20: Unchanged; 06/06/20: 5TSTS: 17.28 secs with PT supportng patient's hands; 08/22/20: 5TSTS: 15.3s with PT supporting patient's hands     Time 12    Period Weeks    Status Partially Met    Target Date 10/17/20      PT LONG TERM GOAL #5   Title Pt will be able to perform 55# x 10 reps in order to increase strength of LE and improve function at home.    Baseline 8/25: 40# 3 x 20 reps; 08/22/20: Pt has been able to perform greater than 10 reps at 70#    Time 12    Period Weeks    Status Achieved      PT LONG TERM GOAL #6   Title Pt will increase 2MWT by at least 66m(40 ft) in order to demonstrate clinically significant improvement in cardiopulmonary endurance and community ambulation.    Baseline 8/25: 140 feet; 08/22/20: 175'    Time 12    Period Weeks    Status Partially Met    Target Date 10/17/20                 Plan - 08/22/20 1023    Clinical Impression Statement Patient arrived late, so session was abbreviated slightly. Updated goals and outcome measures today. Pt improved in her TUG, 5TSTS, 131mait speed, and 2MWT values. Patient's cognitive awareness is a limiting factor when performing outcome measures. Overall however she is showing improvement in function and grandson reports that therapy has been helpful at preventing any major falls. The current plan is to continue for an additional few weeks and then discharge prior to the holidays to assess for carryover and independence with HEP. Pt will benefit from PT services to address deficits in strength, balance, and mobility in order to return to full function at home.    Personal Factors and Comorbidities Age;Comorbidity 3+    Comorbidities Alzheimer's, anxiety/depression, arthritis  Examination-Activity Limitations Bathing;Bed Mobility;Caring for Others;Carry;Dressing;Hygiene/Grooming;Lift;Locomotion Level;Squat;Stand;Transfers    Examination-Participation Restrictions Community Activity;Driving;Interpersonal Relationship;Laundry;Medication Management;Meal Prep;Personal Finances;Shop    Stability/Clinical Decision Making Unstable/Unpredictable     Rehab Potential Fair    PT Frequency 1x / week    PT Duration 8 weeks    PT Treatment/Interventions ADLs/Self Care Home Management;Aquatic Therapy;Biofeedback;Canalith Repostioning;Cryotherapy;Electrical Stimulation;Iontophoresis 63m/ml Dexamethasone;Moist Heat;Traction;Ultrasound;DME Instruction;Gait training;Stair training;Functional mobility training;Therapeutic activities;Therapeutic exercise;Balance training;Neuromuscular re-education;Cognitive remediation;Patient/family education;Manual techniques;Passive range of motion;Dry needling;Vestibular;Joint Manipulations    PT Next Visit Plan continue with balance and strengthening    PT Home Exercise Plan seated marches, seated clams, seated LAQ, feet together balance, slow marching, sit to stand with minimal UE dependendence    Consulted and Agree with Plan of Care Family member/caregiver    Family Member Consulted GAdolphus [place]          Patient will benefit from skilled therapeutic intervention in order to improve the following deficits and impairments:  Abnormal gait, Decreased balance, Decreased cognition, Decreased knowledge of precautions, Decreased safety awareness, Decreased strength, Difficulty walking  Visit Diagnosis: Unsteadiness on feet  History of falling     Problem List Patient Active Problem List   Diagnosis Date Noted   Mal[place] of moderate degree ([place] 01/25/2020   Chronic kidney disease due to hypertension 07/24/2019   Mesenteric artery stenosis ([place] 03/24/2019   Renal artery stenosis ([place] 01/26/2017   Personal history of tobacco use, presenting hazards to health 06/23/2016   Depressive disorder 05/29/2015   Dementia ([place] 05/29/2015   Allergic rhinitis 03/05/2015   Weak pulse 03/05/2015   Smokes tobacco daily 03/05/2015   Generalized anxiety disorder 03/05/2015   Acid reflux 03/05/2015   HLD (hyperlipidemia) 03/05/2015   Affective disorder, major 03/05/2015   Bad memory 03/05/2015    Arthritis, degenerative 03/05/2015   OP (osteoporosis) 03/05/2015   Renovascular hypertension 03/05/2015   Scoliosis 03/05/2015   Avitaminosis D 03/05/2015   Weight loss 11/07/2014   Family history of colon cancer 11/07/2014   JPhillips GroutPT, DPT, GCS  Quanna Wittke 08/22/2020, 2:22 PM  [place] [place] [address]Phone: [phone]  Fax:  [phone] Name: Sarah KLAWITTERMRN: [id number] of Birth: 7Mar 20, 1942

## 2020-08-26 ENCOUNTER — Other Ambulatory Visit: Payer: Self-pay | Admitting: Family Medicine

## 2020-08-26 DIAGNOSIS — I10 Essential (primary) hypertension: Secondary | ICD-10-CM

## 2020-08-29 ENCOUNTER — Ambulatory Visit: Payer: Medicare Other | Attending: Family Medicine

## 2020-08-29 ENCOUNTER — Other Ambulatory Visit: Payer: Self-pay

## 2020-08-29 DIAGNOSIS — Z9181 History of falling: Secondary | ICD-10-CM | POA: Insufficient documentation

## 2020-08-29 DIAGNOSIS — R2681 Unsteadiness on feet: Secondary | ICD-10-CM | POA: Insufficient documentation

## 2020-08-29 NOTE — Therapy (Signed)
[place] MAIN [facility] SERVICES [address] [place], [place], [address] Phone: [phone]   Fax:  [phone]  Physical Therapy Treatment  Patient Details  Name: Sarah [name] MRN: [id number] Date of Birth: 1941-04-25 Referring Provider (PT): Dr. Rosanna Randy   Encounter Date: 08/29/2020   PT End of Session - 08/29/20 0950    Visit Number 39    Number of Visits 65    Date for PT Re-Evaluation 10/17/20    Authorization Type eval: 10/31/19, PN: 06/20/20; Re-cert: 10/28/22-49/75/30    PT Start Time 0945    PT Stop Time 1015    PT Time Calculation (min) 30 min    Equipment Utilized During Treatment Gait belt    Activity Tolerance Patient tolerated treatment well;Patient limited by fatigue;Patient limited by pain    Behavior During Therapy WFL for tasks assessed/performed           Past Medical History:  Diagnosis Date  . Arthritis   . Dementia ([place])    ALZHEIMERS  . Ear anomaly    INNER EAR ISSUES  . Hyperlipidemia   . Hypertension     Past Surgical History:  Procedure Laterality Date  . APPENDECTOMY    . CATARACT EXTRACTION W/PHACO Right 03/09/2018   Procedure: CATARACT EXTRACTION PHACO AND INTRAOCULAR LENS PLACEMENT (IOC);  Surgeon: Birder Robson, MD;  Location: [facility] ORS;  Service: Ophthalmology;  Laterality: Right;  [place] 00:38.9 AP% 15.9 CDE 6.19 FLUID PACK LOT # [id number]  . CATARACT EXTRACTION W/PHACO Left 06/22/2018   Procedure: CATARACT EXTRACTION PHACO AND INTRAOCULAR LENS PLACEMENT (IOC);  Surgeon: Birder Robson, MD;  Location: [facility] ORS;  Service: Ophthalmology;  Laterality: Left;  [place] 00:50 AP% 16.5 CDE 8.25 Fluid pack lot # [id number] H  . CESAREAN SECTION    . CHOLECYSTECTOMY    . CYSTOSCOPY WITH STENT PLACEMENT      There were no vitals filed for this visit.   Subjective Assessment - 08/29/20 0947    Subjective Pt is doing well today and grandson reports she was a little extra "stumbly" this week however no falls. She  continues to complain of knee pain intermittently like always. No specific questions or concerns.    Patient is accompained by: Family member    Pertinent History Sarah [name] is a 79 y.o. female with a history of severe Alzheimer's dementia, hypertension who was brought to the ED due to a fall at home. She hit the back of her head resulting in bleeding.  She was treated with staples to close her wound and had a follow-up appointment with her PCP who ordered PT due to fall. Per grandson patient's Alzheimer's has progressed significantly over the last few months. He states that she likes to walk but is unstable. She has had 2 falls in the last 6 months. Per grandson at baseline she is AOx1 and is able to recognize her son and grandson with whom she lives. She does not participate in any regular exercise.    Patient Stated Goals Improve balance and safety    Currently in Pain? No/denies             TREATMENT   Ther-Ex  Verbal/visual/tactile cues provided, PT also demonstrated/performed exercises with pt to facilitate:  Precor BLE leg press 55# x 20, 70# x 10, x 20; Sit to stand with BUE handheld support x 10 with 3kg med ball;  Seated clams with manual resistance x 10; Seated adductor squeeze with manual resistance x 10; Seated  marches with manual resistance x 10 BLE; Attempted standing heel raises and pt able to complete about 5 reps but struggles with comprehension so unable to complete full set; Seated LAQ with manual resistance form therapist x 10 BLE;   Neuromuscular Re-education Alternating 6" step taps with BUE support x 10 each; Airex standing 2kg med ball tosses to grandson x 10 underhand, x 10 chest passes, grandson gradually increases distance away from pt to challenge patient's power; Therapist danced with patient to tango music in order to work on balance. Performed side stepping in both directions, forward/backward stepping, 180/360 degree turns, and cross-over steps to  both directions;   Pt educated throughout session about proper posture technique with exercises. Improved exercise technique, movement at target joints, use of target muscles after min to mod verbal, visual, tactile cues.   Patient demonstrates excellent motivation during session today.  Pt requires repeated redirection during session due to advanced dementia. Sherequires creative activities for [employer] utilized dynamic activities likeball toesses and dancing. She is able to perform an additional repetitions of leg press again on this date demonstrating improving LE strength. She does not complain about her knee pain during session today. She will need a progress note at next visit. Current plan is to continue therapy until mid November at which time pt will be discharged to continue independently. Pt encouraged to continue HEP and follow-up as scheduled.Shewill benefit from PT services to address deficits in strength, balance, and mobility in order to return to full function at home.                           PT Short Term Goals - 08/22/20 1023      PT SHORT TERM GOAL #1   Title Pt will be participate with HEP at the direction of her caregiver in order to improve strength and balance in order to decrease fall risk and improve function at home.    Baseline 04/04/20: Pt resistant to HEP due to dementia; 8/11: Been doing exercises since Sunday about 20 mins a day.; 08/22/20: Intermittent agreement to perform by patient due to dementia    Time 4    Period Weeks    Status Partially Met    Target Date 09/19/20             PT Long Term Goals - 08/22/20 1024      PT LONG TERM GOAL #1   Title Pt will improve BERG by to at least 40/56 in order to demonstrate clinically significant improvement in balance.    Baseline 10/31/19: 30/56; 01/18/20: 36/56; 04/04/20: 43/56; 06/06/20: 43/56; 08/22/20: 41/56    Time 12    Period Weeks    Status Achieved       PT LONG TERM GOAL #2   Title Pt will decrease TUG to below 14 seconds/decrease in order to demonstrate decreased fall risk.    Baseline 10/31/19: 44.3s; 01/18/20: 32.8s; 04/04/20: 32.1s; 06/06/20: 41.27 secs; 08/22/20: 33.6s    Time 12    Period Weeks    Status Partially Met    Target Date 10/17/20      PT LONG TERM GOAL #3   Title Pt will increase 10MWT to at least 0.50 m/s in order to demonstrate clinically significant improvement in community ambulation.    Baseline 10/31/19: Self-selected: 57.4s =  0.17 m/s; 01/18/20: Self-selected: 29.4s =  0.34 m/s; 04/04/20: Self-selected: 25.6s =  0.39 m/s; 06/06/20: 26.34s = 0.379 m/s; 08/22/20:  20.3s = 0.49 m/s    Time 12    Period Weeks    Status Partially Met    Target Date 10/17/20      PT LONG TERM GOAL #4   Title Pt will be able to perform sit to stand from a regular height chair without UE support in order to demonstrate improved leg strength and safety    Baseline 10/31/19: Heavy UE dependence with sit to stand transfers; 01/18/20: Able to stand without UE reliance however prefers to place hands on arm rests; 04/04/20: Unchanged; 06/06/20: 5TSTS: 17.28 secs with PT supportng patient's hands; 08/22/20: 5TSTS: 15.3s with PT supporting patient's hands    Time 12    Period Weeks    Status Partially Met    Target Date 10/17/20      PT LONG TERM GOAL #5   Title Pt will be able to perform 55# x 10 reps in order to increase strength of LE and improve function at home.    Baseline 8/25: 40# 3 x 20 reps; 08/22/20: Pt has been able to perform greater than 10 reps at 70#    Time 12    Period Weeks    Status Achieved      PT LONG TERM GOAL #6   Title Pt will increase 2MWT by at least 49m(40 ft) in order to demonstrate clinically significant improvement in cardiopulmonary endurance and community ambulation.    Baseline 8/25: 140 feet; 08/22/20: 175'    Time 12    Period Weeks    Status Partially Met    Target Date 10/17/20                 Plan -  08/29/20 0951    Clinical Impression Statement Patient demonstrates excellent motivation during session today.  Pt requires repeated redirection during session due to advanced dementia. She requires creative activities for compliance/performance so utilized dynamic activities like ball toesses and dancing. She is able to perform an additional repetitions of leg press again on this date demonstrating improving LE strength. She does not complain about her knee pain during session today. She will need a progress note at next visit. Current plan is to continue therapy until mid November at which time pt will be discharged to continue independently.  Pt encouraged to continue HEP and follow-up as scheduled. She will benefit from PT services to address deficits in strength, balance, and mobility in order to return to full function at home.    Personal Factors and Comorbidities Age;Comorbidity 3+    Comorbidities Alzheimer's, anxiety/depression, arthritis    Examination-Activity Limitations Bathing;Bed Mobility;Caring for Others;Carry;Dressing;Hygiene/Grooming;Lift;Locomotion Level;Squat;Stand;Transfers    Examination-Participation Restrictions Community Activity;Driving;Interpersonal Relationship;Laundry;Medication Management;Meal Prep;Personal Finances;Shop    Stability/Clinical Decision Making Unstable/Unpredictable    Rehab Potential Fair    PT Frequency 1x / week    PT Duration 8 weeks    PT Treatment/Interventions ADLs/Self Care Home Management;Aquatic Therapy;Biofeedback;Canalith Repostioning;Cryotherapy;Electrical Stimulation;Iontophoresis 473mml Dexamethasone;Moist Heat;Traction;Ultrasound;DME Instruction;Gait training;Stair training;Functional mobility training;Therapeutic activities;Therapeutic exercise;Balance training;Neuromuscular re-education;Cognitive remediation;Patient/family education;Manual techniques;Passive range of motion;Dry needling;Vestibular;Joint Manipulations    PT Next Visit Plan  Progress note, continue with balance and strengthening    PT Home Exercise Plan seated marches, seated clams, seated LAQ, feet together balance, slow marching, sit to stand with minimal UE dependendence    Consulted and Agree with Plan of Care Family member/caregiver    Family Member Consulted GrAdolphus Birchwood         Patient will benefit from skilled therapeutic intervention in  order to improve the following deficits and impairments:  Abnormal gait, Decreased balance, Decreased cognition, Decreased knowledge of precautions, Decreased safety awareness, Decreased strength, Difficulty walking  Visit Diagnosis: Unsteadiness on feet  History of falling     Problem List Patient Active Problem List   Diagnosis Date Noted  . Malnutrition of moderate degree ([place]) 01/25/2020  . Chronic kidney disease due to hypertension 07/24/2019  . Mesenteric artery stenosis ([place]) 03/24/2019  . Renal artery stenosis ([place]) 01/26/2017  . Personal history of tobacco use, presenting hazards to health 06/23/2016  . Depressive disorder 05/29/2015  . Dementia ([place]) 05/29/2015  . Allergic rhinitis 03/05/2015  . Weak pulse 03/05/2015  . Smokes tobacco daily 03/05/2015  . Generalized anxiety disorder 03/05/2015  . Acid reflux 03/05/2015  . HLD (hyperlipidemia) 03/05/2015  . Affective disorder, major 03/05/2015  . Bad memory 03/05/2015  . Arthritis, degenerative 03/05/2015  . OP (osteoporosis) 03/05/2015  . Renovascular hypertension 03/05/2015  . Scoliosis 03/05/2015  . Avitaminosis D 03/05/2015  . Weight loss 11/07/2014  . Family history of colon cancer 11/07/2014   Phillips Grout PT, DPT, GCS  Amrie Gurganus 08/30/2020, 8:05 AM  [place] MAIN [facility] SERVICES [address] [place], [place], [address] Phone: [phone]   Fax:  [phone]  Name: Sarah [name] MRN: [id number] Date of Birth: August 19, 1941

## 2020-09-05 ENCOUNTER — Ambulatory Visit: Payer: Medicare Other

## 2020-09-05 ENCOUNTER — Other Ambulatory Visit: Payer: Self-pay | Admitting: Family Medicine

## 2020-09-05 ENCOUNTER — Other Ambulatory Visit: Payer: Self-pay

## 2020-09-05 DIAGNOSIS — I10 Essential (primary) hypertension: Secondary | ICD-10-CM

## 2020-09-05 DIAGNOSIS — R2681 Unsteadiness on feet: Secondary | ICD-10-CM | POA: Diagnosis not present

## 2020-09-05 DIAGNOSIS — Z9181 History of falling: Secondary | ICD-10-CM

## 2020-09-05 NOTE — Therapy (Signed)
[place] MAIN [facility] SERVICES [address] [place], [place], [address] Phone: [phone]   Fax:  [phone]  Physical Therapy Progress Note/Treatment   Dates of reporting period  06/20/20   to   09/05/20  Patient Details  Name: Sarah [name] MRN: [id number] Date of Birth: 09-17-1941 Referring Provider (PT): Dr. Rosanna Randy   Encounter Date: 09/05/2020   PT End of Session - 09/05/20 0943    Visit Number 40    Number of Visits 65    Date for PT Re-Evaluation 10/17/20    Authorization Type eval: 10/31/19, PN: 09/05/20    PT Start Time 0935    PT Stop Time 1015    PT Time Calculation (min) 40 min    Equipment Utilized During Treatment Gait belt    Activity Tolerance Patient tolerated treatment well;Patient limited by fatigue;Patient limited by pain    Behavior During Therapy [facility] for tasks assessed/performed           Past Medical History:  Diagnosis Date   Arthritis    Dementia ([place])    ALZHEIMERS   Ear anomaly    INNER EAR ISSUES   Hyperlipidemia    Hypertension     Past Surgical History:  Procedure Laterality Date   APPENDECTOMY     CATARACT EXTRACTION W/PHACO Right 03/09/2018   Procedure: CATARACT EXTRACTION PHACO AND INTRAOCULAR LENS PLACEMENT ([place]);  Surgeon: Birder Robson, MD;  Location: [facility] ORS;  Service: Ophthalmology;  Laterality: Right;  [place] 00:38.9 AP% 15.9 CDE 6.19 FLUID PACK LOT # [id number]   CATARACT EXTRACTION W/PHACO Left 06/22/2018   Procedure: CATARACT EXTRACTION PHACO AND INTRAOCULAR LENS PLACEMENT (IOC);  Surgeon: Birder Robson, MD;  Location: [facility] ORS;  Service: Ophthalmology;  Laterality: Left;  [place] 00:50 AP% 16.5 CDE 8.25 Fluid pack lot # [id number]   CESAREAN SECTION     CHOLECYSTECTOMY     CYSTOSCOPY WITH STENT PLACEMENT      There were no vitals filed for this visit.   Subjective Assessment - 09/05/20 0941    Subjective Pt is doing well today. Grandson reports that she had one  stumble this week but did not fall. She continues to complain of her typical knee pain. No specific questions or concerns.    Patient is accompained by: Family member    Pertinent History Sarah [name] is a 79 y.o. female with a history of severe Alzheimer's dementia, hypertension who was brought to the ED due to a fall at home. She hit the back of her head resulting in bleeding.  She was treated with staples to close her wound and had a follow-up appointment with her PCP who ordered PT due to fall. Per grandson patient's Alzheimer's has progressed significantly over the last few months. He states that she likes to walk but is unstable. She has had 2 falls in the last 6 months. Per grandson at baseline she is AOx1 and is able to recognize her son and grandson with whom she lives. She does not participate in any regular exercise.    Patient Stated Goals Improve balance and safety    Currently in Pain? No/denies               TREATMENT   Ther-Ex  Verbal/visual/tactile cues provided, PT also demonstrated/performed exercises with pt to facilitate:  NuStep L2-3 x 5 minutes for warm-up during history and HEP conversation with grandson; Precor BLE leg press 55# x 20, 70# x 20, 85# x 10; Sit to  stand with BUE handheld support x 10; Standing marches with BUE handheld support x 10;  Seated clams with green tband x 10; Seated adductor squeeze with pillow between knees x 10; Seated marches with green tband x 10 BLE;   Neuromuscular Re-education Obstacle course walks with objects of increasing height 1/2 foam roller, foam roller, and 8" bolster, pt requires single hand held assist and CGA from therapist as well as cues from therapist and grandson to perform; Standing soccer ball kicks without UE support with therapist providing CGA and grandson kicking, varying right and left sides; Therapist danced with patient to tango music in order to work on balance. Performed side stepping in both  directions, forward/backward stepping, 180/360 degree turns, and cross-over steps to both directions;   Pt educated throughout session about proper posture technique with exercises. Improved exercise technique, movement at target joints, use of target muscles after min to mod verbal, visual, tactile cues.   Goals updated recently so no need to update them again today. Last time goals were updated pt improved in her TUG, 5TSTS, 9mgait speed, and 2MWT values. Patient's cognitive awareness is a limiting factor when performing outcome measures. Overall however she is showing improvement in function and grandson reports that therapy has been helpful at preventing any major falls. She demonstrates excellent motivation during session today and is able to increase resistance again on the leg press to the highest weight (85#) that she has performed throughout the course of her PT. She is slightly more fearful of falling today during balance exercises. Plan is for one additional therapy visit next week at which time pt will be discharged to continue HEP independently at home. Pt will benefit from PT services to address deficits in strength, balance, and mobility in order to return to full function at home.                        PT Short Term Goals - 09/05/20 1029      PT SHORT TERM GOAL #1   Title Pt will be participate with HEP at the direction of her caregiver in order to improve strength and balance in order to decrease fall risk and improve function at home.    Baseline 04/04/20: Pt resistant to HEP due to dementia; 8/11: Been doing exercises since Sunday about 20 mins a day.; 08/22/20: Intermittent agreement to perform by patient due to dementia    Time 4    Period Weeks    Status Partially Met    Target Date 09/19/20             PT Long Term Goals - 09/05/20 1029      PT LONG TERM GOAL #1   Title Pt will improve BERG by to at least 40/56 in order to demonstrate  clinically significant improvement in balance.    Baseline 10/31/19: 30/56; 01/18/20: 36/56; 04/04/20: 43/56; 06/06/20: 43/56; 08/22/20: 41/56    Time 12    Period Weeks    Status Achieved      PT LONG TERM GOAL #2   Title Pt will decrease TUG to below 14 seconds/decrease in order to demonstrate decreased fall risk.    Baseline 10/31/19: 44.3s; 01/18/20: 32.8s; 04/04/20: 32.1s; 06/06/20: 41.27 secs; 08/22/20: 33.6s    Time 12    Period Weeks    Status Partially Met    Target Date 10/17/20      PT LONG TERM GOAL #3   Title Pt will  increase 10MWT to at least 0.50 m/s in order to demonstrate clinically significant improvement in community ambulation.    Baseline 10/31/19: Self-selected: 57.4s =  0.17 m/s; 01/18/20: Self-selected: 29.4s =  0.34 m/s; 04/04/20: Self-selected: 25.6s =  0.39 m/s; 06/06/20: 26.34s = 0.379 m/s; 08/22/20: 20.3s = 0.49 m/s    Time 12    Period Weeks    Status Partially Met    Target Date 10/17/20      PT LONG TERM GOAL #4   Title Pt will be able to perform sit to stand from a regular height chair without UE support in order to demonstrate improved leg strength and safety    Baseline 10/31/19: Heavy UE dependence with sit to stand transfers; 01/18/20: Able to stand without UE reliance however prefers to place hands on arm rests; 04/04/20: Unchanged; 06/06/20: 5TSTS: 17.28 secs with PT supportng patient's hands; 08/22/20: 5TSTS: 15.3s with PT supporting patient's hands    Time 12    Period Weeks    Status Partially Met    Target Date 10/17/20      PT LONG TERM GOAL #5   Title Pt will be able to perform 55# x 10 reps in order to increase strength of LE and improve function at home.    Baseline 8/25: 40# 3 x 20 reps; 08/22/20: Pt has been able to perform greater than 10 reps at 70#    Time 12    Period Weeks    Status Achieved      PT LONG TERM GOAL #6   Title Pt will increase 2MWT by at least 65m(40 ft) in order to demonstrate clinically significant improvement in cardiopulmonary  endurance and community ambulation.    Baseline 8/25: 140 feet; 08/22/20: 175'    Time 12    Period Weeks    Status Partially Met    Target Date 10/17/20                 Plan - 09/05/20 0943    Clinical Impression Statement Goals updated recently so no need to update them again today. Last time goals were updated pt improved in her TUG, 5TSTS, 152mait speed, and 2MWT values. Patient's cognitive awareness is a limiting factor when performing outcome measures. Overall however she is showing improvement in function and grandson reports that therapy has been helpful at preventing any major falls. She demonstrates excellent motivation during session today and is able to increase resistance again on the leg press to the highest weight (85#) that she has performed throughout the course of her PT. She is slightly more fearful of falling today during balance exercises. Plan is for one additional therapy visit next week at which time pt will be discharged to continue HEP independently at home. Pt will benefit from PT services to address deficits in strength, balance, and mobility in order to return to full function at home    Personal Factors and Comorbidities Age;Comorbidity 3+    Comorbidities Alzheimer's, anxiety/depression, arthritis    Examination-Activity Limitations Bathing;Bed Mobility;Caring for Others;Carry;Dressing;Hygiene/Grooming;Lift;Locomotion Level;Squat;Stand;Transfers    Examination-Participation Restrictions Community Activity;Driving;Interpersonal Relationship;Laundry;Medication Management;Meal Prep;Personal Finances;Shop    Stability/Clinical Decision Making Unstable/Unpredictable    Rehab Potential Fair    PT Frequency 1x / week    PT Duration 8 weeks    PT Treatment/Interventions ADLs/Self Care Home Management;Aquatic Therapy;Biofeedback;Canalith Repostioning;Cryotherapy;Electrical Stimulation;Iontophoresis 48m748ml Dexamethasone;Moist Heat;Traction;Ultrasound;DME  Instruction;Gait training;Stair training;Functional mobility training;Therapeutic activities;Therapeutic exercise;Balance training;Neuromuscular re-education;Cognitive remediation;Patient/family education;Manual techniques;Passive range of motion;Dry needling;Vestibular;Joint Manipulations    PT  Next Visit Plan Review HEP and discharge    PT Home Exercise Plan seated marches, seated clams, seated LAQ, feet together balance, slow marching, sit to stand with minimal UE dependendence    Consulted and Agree with Plan of Care Family member/caregiver    Family Member Consulted [name]           Patient will benefit from skilled therapeutic intervention in order to improve the following deficits and impairments:  Abnormal gait, Decreased balance, Decreased cognition, Decreased knowledge of precautions, Decreased safety awareness, Decreased strength, Difficulty walking  Visit Diagnosis: Unsteadiness on feet  History of falling     Problem List Patient Active Problem List   Diagnosis Date Noted   Malnutrition of moderate degree ([place]) 01/25/2020   Chronic kidney disease due to hypertension 07/24/2019   Mesenteric artery stenosis ([place]) 03/24/2019   Renal artery stenosis ([place]) 01/26/2017   Personal history of tobacco use, presenting hazards to health 06/23/2016   Depressive disorder 05/29/2015   Dementia ([place]) 05/29/2015   Allergic rhinitis 03/05/2015   Weak pulse 03/05/2015   Smokes tobacco daily 03/05/2015   Generalized anxiety disorder 03/05/2015   Acid reflux 03/05/2015   HLD (hyperlipidemia) 03/05/2015   Affective disorder, major 03/05/2015   Bad memory 03/05/2015   Arthritis, degenerative 03/05/2015   OP (osteoporosis) 03/05/2015   Renovascular hypertension 03/05/2015   Scoliosis 03/05/2015   Avitaminosis D 03/05/2015   Weight loss 11/07/2014   Family history of colon cancer 11/07/2014   Phillips Grout PT, DPT, GCS  Ebb Carelock 09/05/2020,  10:41 AM  [place] MAIN [facility] SERVICES [address] [place], [place], [address] Phone: [phone]   Fax:  [phone]  Name: Sarah [name] MRN: [id number] Date of Birth: Feb 07, 1941

## 2020-09-12 ENCOUNTER — Ambulatory Visit: Payer: Medicare Other

## 2020-09-12 ENCOUNTER — Other Ambulatory Visit: Payer: Self-pay

## 2020-09-12 DIAGNOSIS — Z9181 History of falling: Secondary | ICD-10-CM

## 2020-09-12 DIAGNOSIS — R2681 Unsteadiness on feet: Secondary | ICD-10-CM | POA: Diagnosis not present

## 2020-09-12 NOTE — Therapy (Signed)
[place] MAIN [facility] SERVICES [address] [place], [place], [address] Phone: [phone]   Fax:  [phone]  Physical Therapy Treatment/Discharge  Patient Details  Name: Sarah [name] MRN: [id number] Date of Birth: 04/18/1941 Referring Provider (PT): Dr. Rosanna Randy   Encounter Date: 09/12/2020   PT End of Session - 09/12/20 0945    Visit Number 41    Number of Visits 65    Date for PT Re-Evaluation 10/17/20    Authorization Type eval: 10/31/19, PN: 09/05/20    PT Start Time 3614    PT Stop Time 1015    PT Time Calculation (min) 33 min    Equipment Utilized During Treatment Gait belt    Activity Tolerance Patient tolerated treatment well;Patient limited by fatigue;Patient limited by pain    Behavior During Therapy [facility] for tasks assessed/performed           Past Medical History:  Diagnosis Date  . Arthritis   . Dementia ([place])    ALZHEIMERS  . Ear anomaly    INNER EAR ISSUES  . Hyperlipidemia   . Hypertension     Past Surgical History:  Procedure Laterality Date  . APPENDECTOMY    . CATARACT EXTRACTION W/PHACO Right 03/09/2018   Procedure: CATARACT EXTRACTION PHACO AND INTRAOCULAR LENS PLACEMENT (IOC);  Surgeon: Birder Robson, MD;  Location: [facility] ORS;  Service: Ophthalmology;  Laterality: Right;  [place] 00:38.9 AP% 15.9 CDE 6.19 FLUID PACK LOT # [id number]  . CATARACT EXTRACTION W/PHACO Left 06/22/2018   Procedure: CATARACT EXTRACTION PHACO AND INTRAOCULAR LENS PLACEMENT (IOC);  Surgeon: Birder Robson, MD;  Location: [facility] ORS;  Service: Ophthalmology;  Laterality: Left;  [place] 00:50 AP% 16.5 CDE 8.25 Fluid pack lot # [id number]  . CESAREAN SECTION    . CHOLECYSTECTOMY    . CYSTOSCOPY WITH STENT PLACEMENT      There were no vitals filed for this visit.   Subjective Assessment - 09/12/20 0945    Subjective Pt is doing well today. No falls or significant stumbles reported since last therapy session. She continues to complain of  her typical knee pain but no worse. No specific questions or concerns.    Patient is accompained by: Family member    Pertinent History Sarah [name] is a 78 y.o. female with a history of severe Alzheimer's dementia, hypertension who was brought to the ED due to a fall at home. She hit the back of her head resulting in bleeding.  She was treated with staples to close her wound and had a follow-up appointment with her PCP who ordered PT due to fall. Per grandson patient's Alzheimer's has progressed significantly over the last few months. He states that she likes to walk but is unstable. She has had 2 falls in the last 6 months. Per grandson at baseline she is AOx1 and is able to recognize her son and grandson with whom she lives. She does not participate in any regular exercise.    Patient Stated Goals Improve balance and safety    Currently in Pain? No/denies             TREATMENT   Ther-Ex  Verbal/visual/tactile cues provided, PT also demonstrated/performed exercises with pt to facilitate:  NuStep L2 x 5 minutes for warm-up during history and HEP conversation with grandson; Precor BLE leg press 55# x 20, 70# x 15, 85# x 10; Sit to stand with BUE handheld support x 10; Standing marches with BUE handheld support x 10; Seated clams  with manual resistance x 10; Seated adductor squeeze with manual resistance x 10; Seated marches with green tband x 10 BLE; Standing heel raises x 10 with BUE support and demonstration from therapist; Standing mini squats x 10 with BUE support and demonstration from therapist;   Neuromuscular Re-education Attempted Airex balance beam walking and pt is able to perform one length in tandem fashion but unable to understand how to repeat. Attempted to get pt to perform side steps but again she is limited by cognition and unable to understand the exercise; Attempted stepping over 1/2 foam roll in parallel bars and pt is able to perform 3 times but struggles  significantly and constantly attempts to hold onto parallel bars with hands. Therapist danced with patient to tango music in order to work on balance. Performed side stepping in both directions, forward/backward stepping, 180/360 degree turns, and cross-over steps to both directions;   Pt educated throughout session about proper posture technique with exercises. Improved exercise technique, movement at target joints, use of target muscles after min to mod verbal, visual, tactile cues.   Goals updated recently so no need to update them again today. Last time goals were updated pt improved in her TUG, 5TSTS, 66mgait speed, and 2MWT values. Patient's cognitive awareness is a limiting factor when performing outcome measures. Overall however she is showed improvement in function and grandson reports that therapy has been helpful at preventing any major falls. She demonstrates good motivation during session today. She was able to increase the resistance on the leg press over the course of her time in therapy and was ultimately able to increase to 85#. Reviewed HEP with grandson today and provided handout. They are planning on continuing independently at home. Pt will be discharged on this date. Pt and grandson encouraged to return if pt declines.                             PT Education - 09/12/20 1036    Education Details HEP, discharge    Person(s) Educated Patient;Other (comment)   grandson LLinton Rump  Methods Explanation;Handout    Comprehension Verbalized understanding            PT Short Term Goals - 09/12/20 1036      PT SHORT TERM GOAL #1   Title Pt will be participate with HEP at the direction of her caregiver in order to improve strength and balance in order to decrease fall risk and improve function at home.    Baseline 04/04/20: Pt resistant to HEP due to dementia; 8/11: Been doing exercises since Sunday about 20 mins a day.; 08/22/20: Intermittent agreement to  perform by patient due to dementia    Time 4    Period Weeks    Status Achieved    Target Date --             PT Long Term Goals - 09/12/20 1040      PT LONG TERM GOAL #1   Title Pt will improve BERG by to at least 40/56 in order to demonstrate clinically significant improvement in balance.    Baseline 10/31/19: 30/56; 01/18/20: 36/56; 04/04/20: 43/56; 06/06/20: 43/56; 08/22/20: 41/56    Time 12    Period Weeks    Status Achieved      PT LONG TERM GOAL #2   Title Pt will decrease TUG to below 14 seconds/decrease in order to demonstrate decreased fall risk.  Baseline 10/31/19: 44.3s; 01/18/20: 32.8s; 04/04/20: 32.1s; 06/06/20: 41.27 secs; 08/22/20: 33.6s    Time 12    Period Weeks    Status Partially Met      PT LONG TERM GOAL #3   Title Pt will increase 10MWT to at least 0.50 m/s in order to demonstrate clinically significant improvement in community ambulation.    Baseline 10/31/19: Self-selected: 57.4s =  0.17 m/s; 01/18/20: Self-selected: 29.4s =  0.34 m/s; 04/04/20: Self-selected: 25.6s =  0.39 m/s; 06/06/20: 26.34s = 0.379 m/s; 08/22/20: 20.3s = 0.49 m/s    Time 12    Period Weeks    Status Partially Met      PT LONG TERM GOAL #4   Title Pt will be able to perform sit to stand from a regular height chair without UE support in order to demonstrate improved leg strength and safety    Baseline 10/31/19: Heavy UE dependence with sit to stand transfers; 01/18/20: Able to stand without UE reliance however prefers to place hands on arm rests; 04/04/20: Unchanged; 06/06/20: 5TSTS: 17.28 secs with PT supportng patient's hands; 08/22/20: 5TSTS: 15.3s with PT supporting patient's hands    Time 12    Period Weeks    Status Partially Met      PT LONG TERM GOAL #5   Title Pt will be able to perform 55# x 10 reps in order to increase strength of LE and improve function at home.    Baseline 8/25: 40# 3 x 20 reps; 08/22/20: Pt has been able to perform greater than 10 reps at 70#    Time 12    Period Weeks     Status Achieved      PT LONG TERM GOAL #6   Title Pt will increase 2MWT by at least 40m(40 ft) in order to demonstrate clinically significant improvement in cardiopulmonary endurance and community ambulation.    Baseline 8/25: 140 feet; 08/22/20: 175'    Time 12    Period Weeks    Status Partially Met                 Plan - 09/12/20 0950    Clinical Impression Statement Goals updated recently so no need to update them again today. Last time goals were updated pt improved in her TUG, 5TSTS, 1760mait speed, and 2MWT values. Patient's cognitive awareness is a limiting factor when performing outcome measures. Overall however she is showed improvement in function and grandson reports that therapy has been helpful at preventing any major falls. She demonstrates good motivation during session today. She was able to increase the resistance on the leg press over the course of her time in therapy and was ultimately able to increase to 85#. Reviewed HEP with grandson today and provided handout. They are planning on continuing independently at home. Pt will be discharged on this date. Pt and grandson encouraged to return if pt declines.    Personal Factors and Comorbidities Age;Comorbidity 3+    Comorbidities Alzheimer's, anxiety/depression, arthritis    Examination-Activity Limitations Bathing;Bed Mobility;Caring for Others;Carry;Dressing;Hygiene/Grooming;Lift;Locomotion Level;Squat;Stand;Transfers    Examination-Participation Restrictions Community Activity;Driving;Interpersonal Relationship;Laundry;Medication Management;Meal Prep;Personal Finances;Shop    Stability/Clinical Decision Making Unstable/Unpredictable    Rehab Potential Fair    PT Frequency 1x / week    PT Duration 8 weeks    PT Treatment/Interventions ADLs/Self Care Home Management;Aquatic Therapy;Biofeedback;Canalith Repostioning;Cryotherapy;Electrical Stimulation;Iontophoresis 60m260ml Dexamethasone;Moist  Heat;Traction;Ultrasound;DME Instruction;Gait training;Stair training;Functional mobility training;Therapeutic activities;Therapeutic exercise;Balance training;Neuromuscular re-education;Cognitive remediation;Patient/family education;Manual techniques;Passive range of motion;Dry needling;Vestibular;Joint Manipulations  PT Next Visit Plan Discharged    PT Home Exercise Plan Access code: [email or web address]    Consulted and Agree with Plan of Care Family member/caregiver    Family Member Consulted [name]           Patient will benefit from skilled therapeutic intervention in order to improve the following deficits and impairments:  Abnormal gait, Decreased balance, Decreased cognition, Decreased knowledge of precautions, Decreased safety awareness, Decreased strength, Difficulty walking  Visit Diagnosis: Unsteadiness on feet  History of falling     Problem List Patient Active Problem List   Diagnosis Date Noted  . Malnutrition of moderate degree ([place]) 01/25/2020  . Chronic kidney disease due to hypertension 07/24/2019  . Mesenteric artery stenosis ([place]) 03/24/2019  . Renal artery stenosis ([place]) 01/26/2017  . Personal history of tobacco use, presenting hazards to health 06/23/2016  . Depressive disorder 05/29/2015  . Dementia ([place]) 05/29/2015  . Allergic rhinitis 03/05/2015  . Weak pulse 03/05/2015  . Smokes tobacco daily 03/05/2015  . Generalized anxiety disorder 03/05/2015  . Acid reflux 03/05/2015  . HLD (hyperlipidemia) 03/05/2015  . Affective disorder, major 03/05/2015  . Bad memory 03/05/2015  . Arthritis, degenerative 03/05/2015  . OP (osteoporosis) 03/05/2015  . Renovascular hypertension 03/05/2015  . Scoliosis 03/05/2015  . Avitaminosis D 03/05/2015  . Weight loss 11/07/2014  . Family history of colon cancer 11/07/2014   Phillips Grout PT, DPT, GCS  Brooks Stotz 09/12/2020, 11:40 AM  [place] MAIN [facility] SERVICES [address] [place], [place], [address] Phone: [phone]   Fax:  [phone]  Name: SHAQUASHA GERSTEL MRN: [id number] Date of Birth: 1941-07-04

## 2020-09-12 NOTE — Patient Instructions (Signed)
Access Code: [email or web address] URL: [email or web address] Date: 09/12/2020 Prepared by: Roxana Hires  Exercises Seated March - 1 x daily - 7 x weekly - 2 sets - 10 reps Seated Hip Adduction Isometrics with Ball - 1 x daily - 7 x weekly - 2 sets - 10 reps Seated Hip Abduction with Resistance - 1 x daily - 7 x weekly - 2 sets - 10 reps Seated Long Arc Quad - 1 x daily - 7 x weekly - 2 sets - 10 reps Seated Heel Raise - 1 x daily - 7 x weekly - 2 sets - 10 reps Seated Toe Raise - 1 x daily - 7 x weekly - 2 sets - 10 reps Sit to Stand without Arm Support - 1 x daily - 7 x weekly - 2 sets - 10 reps Standing March with Counter Support - 1 x daily - 7 x weekly - 2 sets - 10 reps Mini Squat with Counter Support - 1 x daily - 7 x weekly - 2 sets - 10 reps Side Stepping with Counter Support - 1 x daily - 7 x weekly - 2 sets - 10 reps Lateral Step Up with Counter Support - 1 x daily - 7 x weekly - 2 sets - 10 reps Forward Step Up with Counter Support - 1 x daily - 7 x weekly - 2 sets - 10 reps

## 2020-09-19 ENCOUNTER — Telehealth (INDEPENDENT_AMBULATORY_CARE_PROVIDER_SITE_OTHER): Payer: Medicare Other | Admitting: Physician Assistant

## 2020-09-19 ENCOUNTER — Ambulatory Visit: Payer: Medicare Other

## 2020-09-19 DIAGNOSIS — Z20822 Contact with and (suspected) exposure to covid-19: Secondary | ICD-10-CM | POA: Diagnosis not present

## 2020-09-19 DIAGNOSIS — I129 Hypertensive chronic kidney disease with stage 1 through stage 4 chronic kidney disease, or unspecified chronic kidney disease: Secondary | ICD-10-CM | POA: Diagnosis not present

## 2020-09-19 DIAGNOSIS — E440 Moderate protein-calorie malnutrition: Secondary | ICD-10-CM

## 2020-09-19 NOTE — Progress Notes (Signed)
MyChart Video Visit    Virtual Visit via Video Note   This visit type was conducted due to national recommendations for restrictions regarding the COVID-19 Pandemic (e.g. social distancing) in an effort to limit this patient's exposure and mitigate transmission in our community. This patient is at least at moderate risk for complications without adequate follow up. This format is felt to be most appropriate for this patient at this time. Physical exam was limited by quality of the video and audio technology used for the visit.   Patient location: Home Provider location: Office   I discussed the limitations of evaluation and management by telemedicine and the availability of in person appointments. The patient expressed understanding and agreed to proceed.  Patient: Sarah [name]   DOB: [birth year]   79 y.o. Female  MRN: [id number] Visit Date: 09/19/2020  Today's healthcare provider: Trinna Post, PA-C   Chief Complaint  Patient presents with  . Covid Exposure   Subjective    HPI  Covid Exposure Patient grandson states that patient has chills. He reports that him and his father tested positive for COVID and the grandmother lives with them. Patient has Alzheimer's. Patient with history of CKD and advanced age. Denies SOB, vomiting. Reports some cough.     Medications: Outpatient Medications Prior to Visit  Medication Sig  . acetaminophen (TYLENOL) 500 MG tablet Take 500 mg by mouth at bedtime.   Marland Kitchen CALCIUM CARBONATE-VITAMIN D PO Take 1 tablet by mouth 4 (four) times a week.   . cholecalciferol (VITAMIN D) 1000 units tablet Take 1,000 Units by mouth 4 (four) times a week.   . Cobalamin Combinations (FOLTRATE) 500-1 MCG-MG TABS Take by mouth.   . cyanocobalamin 1000 MCG tablet Take 1,000 mcg by mouth 4 (four) times a week.   Marland Kitchen lisinopril (ZESTRIL) 20 MG tablet Take 1 tablet by mouth once daily  . Probiotic CAPS Take 1 capsule by mouth daily.  . vitamin C (ASCORBIC ACID) 500  MG tablet Take 500 mg by mouth daily.  . vitamin E 400 UNIT capsule Take 400 Units by mouth 4 (four) times a week.    Facility-Administered Medications Prior to Visit  Medication Dose Route Frequency Provider  . lidocaine (PF) (XYLOCAINE) 1 % injection 2 mL  2 mL Intradermal Once Virginia Crews, MD  . lidocaine (PF) (XYLOCAINE) 1 % injection 2 mL  2 mL Intradermal Once Virginia Crews, MD  . lidocaine (PF) (XYLOCAINE) 1 % injection 2 mL  2 mL Intradermal Once Virginia Crews, MD  . lidocaine (PF) (XYLOCAINE) 1 % injection 2 mL  2 mL Intradermal Once Virginia Crews, MD  . methylPREDNISolone acetate (DEPO-MEDROL) injection 40 mg  40 mg Intramuscular Once Virginia Crews, MD  . methylPREDNISolone acetate (DEPO-MEDROL) injection 40 mg  40 mg Intramuscular Once Virginia Crews, MD    Review of Systems  Constitutional: Positive for chills.      Objective    There were no vitals taken for this visit.   Physical Exam Constitutional:      Appearance: Normal appearance.  Pulmonary:     Effort: Pulmonary effort is normal. No respiratory distress.  Neurological:     Mental Status: She is alert.  Psychiatric:        Mood and Affect: Mood normal.        Behavior: Behavior normal.        Assessment & Plan     1. Close exposure to COVID-19  virus  Will refer for infusion.  2. Malnutrition of moderate degree (H[facility])   3. Chronic kidney disease due to hypertension    No follow-ups on file.     I discussed the assessment and treatment plan with the patient. The patient was provided an opportunity to ask questions and all were answered. The patient agreed with the plan and demonstrated an understanding of the instructions.   The patient was advised to call back or seek an in-person evaluation if the symptoms worsen or if the condition fails to improve as anticipated.   ITrinna Post, PA-C, have reviewed all documentation for this visit. The  documentation on 09/25/20 for the exam, diagnosis, procedures, and orders are all accurate and complete.  The entirety of the information documented in the History of Present Illness, Review of Systems and Physical Exam were personally obtained by me. Portions of this information were initially documented by [facility] and reviewed by me for thoroughness and accuracy.   I spent 20 minutes dedicated to the care of this patient on the date of this encounter to include pre-visit review of records, face-to-face time with the patient discussing covid, and post visit ordering of testing.    Paulene Floor [facility] [phone] (phone) [phone] (fax)  [place]

## 2020-09-20 ENCOUNTER — Other Ambulatory Visit: Payer: Self-pay | Admitting: Physician Assistant

## 2020-09-20 DIAGNOSIS — I150 Renovascular hypertension: Secondary | ICD-10-CM

## 2020-09-20 DIAGNOSIS — I129 Hypertensive chronic kidney disease with stage 1 through stage 4 chronic kidney disease, or unspecified chronic kidney disease: Secondary | ICD-10-CM

## 2020-09-20 DIAGNOSIS — R058 Other specified cough: Secondary | ICD-10-CM

## 2020-09-20 DIAGNOSIS — F17200 Nicotine dependence, unspecified, uncomplicated: Secondary | ICD-10-CM

## 2020-09-20 DIAGNOSIS — Z20822 Contact with and (suspected) exposure to covid-19: Secondary | ICD-10-CM

## 2020-09-20 DIAGNOSIS — I10 Essential (primary) hypertension: Secondary | ICD-10-CM

## 2020-09-20 NOTE — Progress Notes (Signed)
I connected by phone with [name] on 09/20/2020 at 9:46 AM to discuss the potential use of a new treatment for mild to moderate COVID-19 viral infection in non-hospitalized patients.  This patient is a 79 y.o. female that meets the FDA criteria for Emergency Use Authorization of COVID monoclonal antibody sotrovimab, casirivimab/imdevimab or bamlamivimab/estevimab.  Has a (+) direct SARS-CoV-2 viral test result  Has mild or moderate COVID-19   Is NOT hospitalized due to COVID-19  Is within 10 days of symptom onset  Has at least one of the high risk factor(s) for progression to severe COVID-19 and/or hospitalization as defined in EUA.  Specific high risk criteria : Older age (>/= 81 yo), Chronic Kidney Disease (CKD), Cardiovascular disease or hypertension and Other high risk medical condition per CDC:  high SVI   I have spoken and communicated the following to the patient or parent/caregiver regarding COVID monoclonal antibody treatment:  1. FDA has authorized the emergency use for the treatment of mild to moderate COVID-19 in adults and pediatric patients with positive results of direct SARS-CoV-2 viral testing who are 10 years of age and older weighing at least 40 kg, and who are at high risk for progressing to severe COVID-19 and/or hospitalization.  2. The significant known and potential risks and benefits of COVID monoclonal antibody, and the extent to which such potential risks and benefits are unknown.  3. Information on available alternative treatments and the risks and benefits of those alternatives, including clinical trials.  4. Patients treated with COVID monoclonal antibody should continue to self-isolate and use infection control measures (e.g., wear mask, isolate, social distance, avoid sharing personal items, clean and disinfect "high touch" surfaces, and frequent handwashing) according to CDC guidelines.   5. The patient or parent/caregiver has the option to accept or  refuse COVID monoclonal antibody treatment.  After reviewing this information with the patient, the patient has agreed to receive one of the available covid 19 monoclonal antibodies and will be provided an appropriate fact sheet prior to infusion.  Sx onset 11/23. Set up for infusion on 11/26 @ 3:30pm. Directions given to [facility]. Pt is aware that insurance will be charged an infusion fee. Pt is fully vaccinated.   Angelena Form 09/20/2020 9:46 AM

## 2020-09-21 ENCOUNTER — Ambulatory Visit (HOSPITAL_COMMUNITY)
Admission: RE | Admit: 2020-09-21 | Discharge: 2020-09-21 | Disposition: A | Discharge status: Home / Self Care | Payer: Medicare Other | Source: Ambulatory Visit | Attending: Pulmonary Disease | Admitting: Pulmonary Disease

## 2020-09-21 DIAGNOSIS — I129 Hypertensive chronic kidney disease with stage 1 through stage 4 chronic kidney disease, or unspecified chronic kidney disease: Secondary | ICD-10-CM | POA: Diagnosis not present

## 2020-09-21 DIAGNOSIS — Z23 Encounter for immunization: Secondary | ICD-10-CM | POA: Insufficient documentation

## 2020-09-21 DIAGNOSIS — U071 COVID-19: Secondary | ICD-10-CM | POA: Diagnosis present

## 2020-09-21 DIAGNOSIS — Z20822 Contact with and (suspected) exposure to covid-19: Secondary | ICD-10-CM

## 2020-09-21 DIAGNOSIS — F17200 Nicotine dependence, unspecified, uncomplicated: Secondary | ICD-10-CM | POA: Insufficient documentation

## 2020-09-21 DIAGNOSIS — I150 Renovascular hypertension: Secondary | ICD-10-CM

## 2020-09-21 DIAGNOSIS — R058 Other specified cough: Secondary | ICD-10-CM

## 2020-09-21 DIAGNOSIS — I10 Essential (primary) hypertension: Secondary | ICD-10-CM

## 2020-09-21 DIAGNOSIS — N189 Chronic kidney disease, unspecified: Secondary | ICD-10-CM | POA: Insufficient documentation

## 2020-09-21 MED ORDER — SODIUM CHLORIDE 0.9 % IV SOLN: INTRAVENOUS | Status: DC | PRN

## 2020-09-21 MED ORDER — EPINEPHRINE 0.3 MG/0.3ML IJ SOAJ: 0.3000 mg | Freq: Once | INTRAMUSCULAR | Status: DC | PRN

## 2020-09-21 MED ORDER — SOTROVIMAB 500 MG/8ML IV SOLN
500.0000 mg | Freq: Once | INTRAVENOUS | Status: AC
  Administered 2020-09-21: 500 mg via INTRAVENOUS

## 2020-09-21 MED ORDER — DIPHENHYDRAMINE HCL 50 MG/ML IJ SOLN: 50.0000 mg | Freq: Once | INTRAMUSCULAR | Status: DC | PRN

## 2020-09-21 MED ORDER — METHYLPREDNISOLONE SODIUM SUCC 125 MG IJ SOLR: 125.0000 mg | Freq: Once | INTRAMUSCULAR | Status: DC | PRN

## 2020-09-21 MED ORDER — ALBUTEROL SULFATE HFA 108 (90 BASE) MCG/ACT IN AERS: 2.0000 | INHALATION_SPRAY | Freq: Once | RESPIRATORY_TRACT | Status: DC | PRN

## 2020-09-21 MED ORDER — FAMOTIDINE IN NACL 20-0.9 MG/50ML-% IV SOLN: 20.0000 mg | Freq: Once | INTRAVENOUS | Status: DC | PRN

## 2020-09-21 NOTE — Progress Notes (Signed)
Diagnosis: COVID-19  Physician: Dr. Patrick Wright  Procedure: Covid Infusion Clinic Med: Sotrovimab infusion - Provided patient with sotrovimab fact sheet for patients, parents, and caregivers prior to infusion.   Complications: No immediate complications noted  Discharge: Discharged home    

## 2020-09-21 NOTE — Discharge Instructions (Signed)

## 2020-09-21 NOTE — Progress Notes (Signed)
Patient reviewed Fact Sheet for Patients, Parents, and Caregivers for Emergency Use Authorization (EUA) of Sotrovimab for the Treatment of Coronavirus. Patient also reviewed and is agreeable to the estimated cost of treatment. Patient is agreeable to proceed.   

## 2020-09-26 ENCOUNTER — Ambulatory Visit: Payer: Medicare Other

## 2020-10-03 ENCOUNTER — Ambulatory Visit: Payer: Medicare Other

## 2020-10-10 ENCOUNTER — Ambulatory Visit: Payer: Medicare Other

## 2020-10-17 ENCOUNTER — Ambulatory Visit: Payer: Medicare Other

## 2020-10-24 ENCOUNTER — Ambulatory Visit: Payer: Medicare Other

## 2020-10-30 ENCOUNTER — Ambulatory Visit: Payer: Medicare Other | Admitting: Family Medicine

## 2020-11-21 ENCOUNTER — Encounter: Payer: Self-pay | Admitting: Family Medicine

## 2020-11-21 ENCOUNTER — Ambulatory Visit (INDEPENDENT_AMBULATORY_CARE_PROVIDER_SITE_OTHER): Payer: Medicare Other | Admitting: Family Medicine

## 2020-11-21 ENCOUNTER — Other Ambulatory Visit: Payer: Self-pay

## 2020-11-21 VITALS — BP 195/89 | HR 77 | Temp 98.2°F | Wt 93.0 lb

## 2020-11-21 DIAGNOSIS — I150 Renovascular hypertension: Secondary | ICD-10-CM | POA: Diagnosis not present

## 2020-11-21 DIAGNOSIS — E440 Moderate protein-calorie malnutrition: Secondary | ICD-10-CM | POA: Diagnosis not present

## 2020-11-21 DIAGNOSIS — N183 Chronic kidney disease, stage 3 unspecified: Secondary | ICD-10-CM | POA: Diagnosis not present

## 2020-11-21 DIAGNOSIS — F02818 Dementia in other diseases classified elsewhere, unspecified severity, with other behavioral disturbance: Secondary | ICD-10-CM

## 2020-11-21 DIAGNOSIS — F0281 Dementia in other diseases classified elsewhere with behavioral disturbance: Secondary | ICD-10-CM

## 2020-11-21 DIAGNOSIS — G301 Alzheimer's disease with late onset: Secondary | ICD-10-CM

## 2020-11-21 DIAGNOSIS — I701 Atherosclerosis of renal artery: Secondary | ICD-10-CM

## 2020-11-21 MED ORDER — ARIPIPRAZOLE 2 MG PO TABS: 2.0000 mg | ORAL_TABLET | Freq: Every day | ORAL | 5 refills | Status: DC

## 2020-11-21 NOTE — Progress Notes (Signed)
Established patient visit   Patient: Sarah [name]   DOB: 21-Feb-1941   80 y.o. Female  MRN: [id number] Visit Date: 11/21/2020  Today's healthcare provider: Wilhemena Durie, MD   No chief complaint on file.  Subjective    HPI  Patient is having progressive dementia with no behavioral disturbances.  She is having a lot of mood swings and verbal outbursts at home.  No physical aggression yet.  No wandering yet.  Sleep pattern is definitely erratic. Late onset Alzheimer's disease without behavioral disturbance ([place])  From 07/30/2020-Progressive.  MMSE on next visit early next year.  She has had her Covid vaccine. I am concerned about possible future behavioral issues.  Renovascular hypertension From 07/30/2020-Fair control.  She has not taken her medication today.  Weight loss From 07/30/2020-Clinically stable.  She has gained 2 pounds since her last visit.  Knee pain From 07/30/2020-Consider topicals such as Voltaren gel.  Patient Active Problem List   Diagnosis Date Noted  . Malnutrition of moderate degree ([place]) 01/25/2020  . Chronic kidney disease due to hypertension 07/24/2019  . Mesenteric artery stenosis ([place]) 03/24/2019  . Renal artery stenosis ([place]) 01/26/2017  . Personal history of tobacco use, presenting hazards to health 06/23/2016  . Depressive disorder 05/29/2015  . Dementia ([place]) 05/29/2015  . Allergic rhinitis 03/05/2015  . Weak pulse 03/05/2015  . Smokes tobacco daily 03/05/2015  . Generalized anxiety disorder 03/05/2015  . Acid reflux 03/05/2015  . HLD (hyperlipidemia) 03/05/2015  . Affective disorder, major 03/05/2015  . Bad memory 03/05/2015  . Arthritis, degenerative 03/05/2015  . OP (osteoporosis) 03/05/2015  . Renovascular hypertension 03/05/2015  . Scoliosis 03/05/2015  . Avitaminosis D 03/05/2015  . Weight loss 11/07/2014  . Family history of colon cancer 11/07/2014       Medications: Outpatient Medications Prior to Visit   Medication Sig  . acetaminophen (TYLENOL) 500 MG tablet Take 500 mg by mouth at bedtime.   Marland Kitchen CALCIUM CARBONATE-VITAMIN D PO Take 1 tablet by mouth 4 (four) times a week.   . cholecalciferol (VITAMIN D) 1000 units tablet Take 1,000 Units by mouth 4 (four) times a week.   . Cobalamin Combinations (FOLTRATE) 500-1 MCG-MG TABS Take by mouth.   . cyanocobalamin 1000 MCG tablet Take 1,000 mcg by mouth 4 (four) times a week.   Marland Kitchen lisinopril (ZESTRIL) 20 MG tablet Take 1 tablet by mouth once daily  . Probiotic CAPS Take 1 capsule by mouth daily.  . vitamin C (ASCORBIC ACID) 500 MG tablet Take 500 mg by mouth daily.  . vitamin E 400 UNIT capsule Take 400 Units by mouth 4 (four) times a week.    Facility-Administered Medications Prior to Visit  Medication Dose Route Frequency Provider  . lidocaine (PF) (XYLOCAINE) 1 % injection 2 mL  2 mL Intradermal Once Virginia Crews, MD  . lidocaine (PF) (XYLOCAINE) 1 % injection 2 mL  2 mL Intradermal Once Virginia Crews, MD  . lidocaine (PF) (XYLOCAINE) 1 % injection 2 mL  2 mL Intradermal Once Virginia Crews, MD  . lidocaine (PF) (XYLOCAINE) 1 % injection 2 mL  2 mL Intradermal Once Virginia Crews, MD  . methylPREDNISolone acetate (DEPO-MEDROL) injection 40 mg  40 mg Intramuscular Once Virginia Crews, MD  . methylPREDNISolone acetate (DEPO-MEDROL) injection 40 mg  40 mg Intramuscular Once Virginia Crews, MD    Review of Systems  Constitutional: Positive for unexpected weight change. Negative for appetite change, chills, fatigue  and fever.  Respiratory: Negative.  Negative for chest tightness and shortness of breath.   Cardiovascular: Negative.  Negative for chest pain and palpitations.  Gastrointestinal: Negative.  Negative for abdominal pain, nausea and vomiting.  Neurological: Negative for dizziness and weakness.       Objective    There were no vitals taken for this visit.    Physical Exam Vitals reviewed.   Constitutional:      General: She is not in acute distress.    Appearance: She is not ill-appearing, toxic-appearing or diaphoretic.     Comments: Cachectic female in no acute distress.  HENT:     Head: Normocephalic and atraumatic.     Mouth/Throat:     Mouth: Mucous membranes are moist.     Pharynx: No oropharyngeal exudate.  Cardiovascular:     Rate and Rhythm: Normal rate and regular rhythm.  Pulmonary:     Effort: Pulmonary effort is normal. No respiratory distress.     Breath sounds: Normal breath sounds. No stridor. No wheezing, rhonchi or rales.  Chest:     Chest wall: No tenderness.  Abdominal:     Palpations: Abdomen is soft.  Musculoskeletal:     Cervical back: Normal range of motion.  Skin:    General: Skin is warm and dry.     Capillary Refill: Capillary refill takes less than 2 seconds.  Neurological:     Mental Status: She is alert. Mental status is at baseline.  Psychiatric:        Mood and Affect: Mood normal.        Behavior: Behavior normal.       No results found for any visits on 11/21/20.  Assessment & Plan     1. Late onset Alzheimer's disease with behavioral disturbance ([place]) Refer to chronic care management for social work help with possible placement in a memory care unit.  Unaware of steps and need to take to get this time.  Also try Abilify 2 mg daily behavioral issues.  We will try this in the evening to help with sleep pattern hopefully.  She evidently became too sedated with Seroquel at 25 grams daily. Elavil at 6 weeks. - AMB Referral to [facility]  2. Malnutrition of moderate degree (H[facility]) Not ready for hospice care yet.  This rapid decline continues may consider palliative care.  3. Renal artery stenosis ([place])   4. Late onset Alzheimer's dementia with behavioral disturbance ([place])  - AMB Referral to [facility]  5. Stage 3 chronic kidney disease, unspecified whether stage 3a or 3b CKD ([place])   6.  Hypertension, renovascular  7.  Hearing loss right ear Right EAC is irrigated and cerumen is removed.  No follow-ups on file.      I, Wilhemena Durie, MD, have reviewed all documentation for this visit. The documentation on 11/25/20 for the exam, diagnosis, procedures, and orders are all accurate and complete.    Zamaya Rapaport Cranford Mon, MD  [facility] [phone] (phone) [phone] (fax)  [place]

## 2020-11-26 ENCOUNTER — Telehealth: Payer: Self-pay | Admitting: *Deleted

## 2020-11-26 NOTE — Chronic Care Management (AMB) (Signed)
  Chronic Care Management   Outreach Note  11/26/2020 Name: Sarah [name] MRN: [id number] DOB: [birth year]  Sarah [name] is a 80 y.o. year old female who is a primary care patient of Jerrol Banana., MD. I reached out to [name] by phone today in response to a referral sent by Ms. [name]'s PCP, Jerrol Banana., MD.  An unsuccessful telephone outreach was attempted today. The patient was referred to the case management team for assistance with care management and care coordination.   Follow Up Plan: A HIPAA compliant phone message was left for the patient providing contact information and requesting a return call. The care management team will reach out to the patient again over the next 7 days. If patient returns call to provider office, please advise to call [place] at [phone].  [place] Management

## 2020-11-30 NOTE — Chronic Care Management (AMB) (Signed)
  Chronic Care Management   Outreach Note  11/30/2020 Name: [name] MRN: [id number] DOB: Mar 24, 1941  Sarah [name] is a 80 y.o. year old female who is a primary care patient of Jerrol Banana., MD. I reached out to [name] by phone today in response to a referral sent by Ms. Royetta Asal Haith's PCPGilbert, Retia Passe., MD.     A second unsuccessful telephone outreach was attempted today. The patient was referred to the case management team for assistance with care management and care coordination.   Follow Up Plan: A HIPAA compliant phone message was left for the patient providing contact information and requesting a return call. The care management team will reach out to the patient again over the next 7 days. If patient returns call to provider office, please advise to call [place] at [phone].  [place] Management

## 2020-12-06 NOTE — Chronic Care Management (AMB) (Signed)
  Chronic Care Management   Outreach Note  12/06/2020 Name: [name] MRN: [id number] DOB: 01-25-1941  Sarah [name] is a 80 y.o. year old female who is a primary care patient of Jerrol Banana., MD. I reached out to [name] by phone today in response to a referral sent by Ms. Royetta Asal Vivar's PCP, Jerrol Banana., MD.  Third unsuccessful telephone outreach was attempted today. The patient was referred to the case management team for assistance with care management and care coordination. The patient's primary care provider has been notified of our unsuccessful attempts to make or maintain contact with the patient. The care management team is pleased to engage with this patient at any time in the future should he/she be interested in assistance from the care management team.   [place] Management  Direct Dial: [phone]

## 2020-12-10 ENCOUNTER — Ambulatory Visit: Payer: Self-pay | Admitting: *Deleted

## 2020-12-10 ENCOUNTER — Other Ambulatory Visit: Payer: Self-pay | Admitting: *Deleted

## 2020-12-10 DIAGNOSIS — F0281 Dementia in other diseases classified elsewhere with behavioral disturbance: Secondary | ICD-10-CM

## 2020-12-10 DIAGNOSIS — N183 Chronic kidney disease, stage 3 unspecified: Secondary | ICD-10-CM

## 2020-12-10 DIAGNOSIS — G301 Alzheimer's disease with late onset: Secondary | ICD-10-CM

## 2020-12-10 DIAGNOSIS — G8929 Other chronic pain: Secondary | ICD-10-CM

## 2020-12-10 DIAGNOSIS — M25562 Pain in left knee: Secondary | ICD-10-CM

## 2020-12-10 DIAGNOSIS — R296 Repeated falls: Secondary | ICD-10-CM

## 2020-12-10 NOTE — Telephone Encounter (Signed)
CCM was ordered.

## 2020-12-10 NOTE — Telephone Encounter (Signed)
Grandson calls reported Sarah [name] fell several times last week due to increased weakness in her legs. No obvious serious injuries noted.  Her ability to feed herself has also declined. She no longer stands on her own nor walks. This decline has been occurring over the 2-3 weeks time.He requested a virtual appointment as does not feel they can bring her in. Virtual appointment scheduled by agent for 12/18/20 1:00p. He would like to discuss having some assistance for her care. If possible please reach out to him earlier than appointment with any information regarding needing assistance in the home.   Reason for Disposition . [1] Falling (two or more falls) AND [2] in past year  Answer Assessment - Initial Assessment Questions 1. MECHANISM: "How did the fall happen?"     Walking and legs giving out 2. DOMESTIC VIOLENCE AND ELDER ABUSE SCREENING: "Did you fall because someone pushed you or tried to hurt you?" If Yes, ask: "Are you safe now?"     no 3. ONSET: "When did the fall happen?" (e.g., minutes, hours, or days ago)     Last week 4. LOCATION: "What part of the body hit the ground?" (e.g., back, buttocks, head, hips, knees, hands, head, stomach) Right leg mostly, unable to place weight on it and difficulty sitting up straight.. 5. INJURY: "Did you hurt (injure) yourself when you fell?" If Yes, ask: "What did you injure? Tell me more about this?" (e.g., body area; type of injury; pain severity)"      6. PAIN: "Is there any pain?" If Yes, ask: "How bad is the pain?" (e.g., Scale 1-10; or mild,  moderate, severe)   - NONE (0): no pain   - MILD (1-3): doesn't interfere with normal activities    - MODERATE (4-7): interferes with normal activities or awakens from sleep    - SEVERE (8-10): excruciating pain, unable to do any normal activities      Unable to assess due to dementia. Is not moaning or wincing 7. SIZE: For cuts, bruises, or swelling, ask: "How large is it?" (e.g., inches or centimeters)       Bruised knees 8. PREGNANCY: "Is there any chance you are pregnant?" "When was your last menstrual period?"     na 9. OTHER SYMPTOMS: "Do you have any other symptoms?" (e.g., dizziness, fever, weakness; new onset or worsening).      Weakness worsening 10. CAUSE: "What do you think caused the fall (or falling)?" (e.g., tripped, dizzy spell)       unsure  Protocols used: FALLS AND FALLING-A-AH

## 2020-12-10 NOTE — Telephone Encounter (Signed)
What about CCM referral for seeing what can I help can be offered.

## 2020-12-10 NOTE — Telephone Encounter (Signed)
Please advise 

## 2020-12-11 ENCOUNTER — Telehealth: Payer: Self-pay | Admitting: *Deleted

## 2020-12-11 NOTE — Chronic Care Management (AMB) (Signed)
  Chronic Care Management   Note  12/11/2020 Name: Sarah [name] MRN: [id number] DOB: September 24, 1941  Sarah [name] is a 81 y.o. year old female who is a primary care patient of Jerrol Banana., MD. I reached out to [name] by phone today in response to a referral sent by Sarah [name]'s PCP, Jerrol Banana., MD.  Sarah [name] was given information about Chronic Care Management services today including:  1. CCM service includes personalized support from designated clinical staff supervised by her physician, including individualized plan of care and coordination with other care providers 2. 24/7 contact phone numbers for assistance for urgent and routine care needs. 3. Service will only be billed when office clinical staff spend 20 minutes or more in a month to coordinate care. 4. Only one practitioner may furnish and bill the service in a calendar month. 5. The patient may stop CCM services at any time (effective at the end of the month) by phone call to the office staff. 6. The patient will be responsible for cost sharing (co-pay) of up to 20% of the service fee (after annual deductible is met).  [employer] grandson DPR on file  verbally agreed to assistance and services provided by embedded care coordination/care management team today.  Follow up plan: Telephone appointment with care management team member scheduled for: Licensed Clinical Social Worker on 12/13/2020 RNCM on 12/14/2020   [place] Management

## 2020-12-13 ENCOUNTER — Ambulatory Visit (INDEPENDENT_AMBULATORY_CARE_PROVIDER_SITE_OTHER): Payer: Medicare Other | Admitting: *Deleted

## 2020-12-13 DIAGNOSIS — N183 Chronic kidney disease, stage 3 unspecified: Secondary | ICD-10-CM

## 2020-12-13 DIAGNOSIS — G301 Alzheimer's disease with late onset: Secondary | ICD-10-CM | POA: Diagnosis not present

## 2020-12-13 DIAGNOSIS — F0281 Dementia in other diseases classified elsewhere with behavioral disturbance: Secondary | ICD-10-CM

## 2020-12-13 DIAGNOSIS — R296 Repeated falls: Secondary | ICD-10-CM

## 2020-12-13 NOTE — Patient Instructions (Signed)
Visit Information  PATIENT GOALS:  Goals Addressed            This Visit's Progress   . Find Help in My Community       Timeframe:  Short-Term Goal Priority:  High Start Date:      12/13/20                       Expected End Date: 05/07/21                      Follow Up Date 12/27/2020   - follow-up on any referrals for help I am given - think ahead to make sure my need does not become an emergency - have a back-up plan    Why is this important?    Knowing how and where to find help for yourself or family in your neighborhood and community is an important skill.   You will want to take some steps to learn how.    Notes:        Sarah [name] was given information about Care Management services today including:  1. Care Management services include personalized support from designated clinical staff supervised by her physician, including individualized plan of care and coordination with other care providers 2. 24/7 contact phone numbers for assistance for urgent and routine care needs. 3. The patient may stop CCM services at any time (effective at the end of the month) by phone call to the office staff.  Patient agreed to services and verbal consent obtained.   The patient verbalized understanding of instructions, educational materials, and care plan provided today and declined offer to receive copy of patient instructions, educational materials, and care plan.   Telephone follow up appointment with care management team member scheduled for: 12/27/20    Elliot Gurney, [place] Worker  [facility] Care Management [phone]

## 2020-12-13 NOTE — Chronic Care Management (AMB) (Signed)
Chronic Care Management    Clinical Social Work Note  12/13/2020 Name: Sarah [name] MRN: [id number] DOB: 28-Dec-1940  Sarah [name] is a 80 y.o. year old female who is a primary care patient of Jerrol Banana., MD. The CCM team was consulted to assist the patient with chronic disease management and/or care coordination needs related to: [employer] .   Engaged with patient's grandson by telephone for initial visit in response to provider referral for social work chronic care management and care coordination services.   Consent to Services:  The patient was given the following information about Chronic Care Management services today, agreed to services, and gave verbal consent: 1. CCM service includes personalized support from designated clinical staff supervised by the primary care provider, including individualized plan of care and coordination with other care providers 2. 24/7 contact phone numbers for assistance for urgent and routine care needs. 3. Service will only be billed when office clinical staff spend 20 minutes or more in a month to coordinate care. 4. Only one practitioner may furnish and bill the service in a calendar month. 5.The patient may stop CCM services at any time (effective at the end of the month) by phone call to the office staff. 6. The patient will be responsible for cost sharing (co-pay) of up to 20% of the service fee (after annual deductible is met). Patient agreed to services and consent obtained.  Patient agreed to services and consent obtained.   Assessment: Review of patient past medical history, allergies, medications, and health status, including review of relevant consultants reports was performed today as part of a comprehensive evaluation and provision of chronic care management and care coordination services.     SDOH (Social Determinants of Health) assessments and interventions performed:    Advanced Directives Status: Not addressed in this  encounter.  CCM Care Plan  Allergies  Allergen Reactions  . Penicillins Hives and Other (See Comments)    Has patient had a PCN reaction causing immediate rash, facial/tongue/throat swelling, SOB or lightheadedness with hypotension: No Has patient had a PCN reaction causing severe rash involving mucus membranes or skin necrosis: No Has patient had a PCN reaction that required hospitalization: No Has patient had a PCN reaction occurring within the last 10 years: No  If all of the above answers are "NO", then may proceed with Cephalosporin use.     Outpatient Encounter Medications as of 12/13/2020  Medication Sig Note  . acetaminophen (TYLENOL) 500 MG tablet Take 500 mg by mouth at bedtime.  06/26/2020: As needed  . ARIPiprazole (ABILIFY) 2 MG tablet Take 1 tablet (2 mg total) by mouth daily.   Marland Kitchen CALCIUM CARBONATE-VITAMIN D PO Take 1 tablet by mouth 4 (four) times a week.    . cholecalciferol (VITAMIN D) 1000 units tablet Take 1,000 Units by mouth 4 (four) times a week.    . Cobalamin Combinations (FOLTRATE) 500-1 MCG-MG TABS Take by mouth.    . cyanocobalamin 1000 MCG tablet Take 1,000 mcg by mouth 4 (four) times a week.    Marland Kitchen lisinopril (ZESTRIL) 20 MG tablet Take 1 tablet by mouth once daily   . Probiotic CAPS Take 1 capsule by mouth daily.   . vitamin C (ASCORBIC ACID) 500 MG tablet Take 500 mg by mouth daily.   . vitamin E 400 UNIT capsule Take 400 Units by mouth 4 (four) times a week.     Facility-Administered Encounter Medications as of 12/13/2020  Medication  . lidocaine (  PF) (XYLOCAINE) 1 % injection 2 mL  . lidocaine (PF) (XYLOCAINE) 1 % injection 2 mL  . lidocaine (PF) (XYLOCAINE) 1 % injection 2 mL  . lidocaine (PF) (XYLOCAINE) 1 % injection 2 mL  . methylPREDNISolone acetate (DEPO-MEDROL) injection 40 mg  . methylPREDNISolone acetate (DEPO-MEDROL) injection 40 mg    Patient Active Problem List   Diagnosis Date Noted  . Malnutrition of moderate degree ([place]) 01/25/2020   . Chronic kidney disease due to hypertension 07/24/2019  . Mesenteric artery stenosis ([place]) 03/24/2019  . Renal artery stenosis ([place]) 01/26/2017  . Personal history of tobacco use, presenting hazards to health 06/23/2016  . Depressive disorder 05/29/2015  . Dementia ([place]) 05/29/2015  . Allergic rhinitis 03/05/2015  . Weak pulse 03/05/2015  . Smokes tobacco daily 03/05/2015  . Generalized anxiety disorder 03/05/2015  . Acid reflux 03/05/2015  . HLD (hyperlipidemia) 03/05/2015  . Affective disorder, major 03/05/2015  . Bad memory 03/05/2015  . Arthritis, degenerative 03/05/2015  . OP (osteoporosis) 03/05/2015  . Renovascular hypertension 03/05/2015  . Scoliosis 03/05/2015  . Avitaminosis D 03/05/2015  . Weight loss 11/07/2014  . Family history of colon cancer 11/07/2014    Conditions to be addressed/monitored: Dementia; Cognitive Deficits  Care Plan : General Social Work (Adult)  Updates made by Vern Claude, LCSW since 12/13/2020 12:00 AM    Problem: Caregiver Stress     Goal: Caregiver Coping Optimized   Start Date: 12/13/2020  Expected End Date: 04/12/2021  This Visit's Progress: On track  Priority: High  Note:   Current Barriers:  . Level of care concerns, Cognitive Deficits, Inability to perform ADL's independently, and Inability to perform IADL's independently . Unable to perform ADLs independently . Unable to perform IADLs independently  Clinical Social Work Clinical Goal(s):  Marland Kitchen Over the next 30 days, patient's grandson will follow up with providers office regarding possible referral for Hospice  Interventions: . 1:1 collaboration with Jerrol Banana., MD regarding development and update of comprehensive plan of care as evidenced by provider attestation and co-signature . Inter-disciplinary care team collaboration (see longitudinal plan of care) . Patient interviewed and appropriate assessments performed . Patient resides with her son and  grandson . Patient's grandson discussed concern that patient is not mobile and spends most of her days in bed . Discussed need for more care services in the home . Discussed plan to discuss referral for Hospice as  possibility as patient's Alzheimer's progresses and physical condition declines(patient cannot walk, beginning to sleep a lot lately) . Provided education to patient/caregiver about Hospice and/or Palliative Care services . Discussed plans with patient for ongoing care management follow up and provided patient with direct contact information for care management team  Patient Goals/Self-Care Activities Over the next  30 days, patient will:   - Patient will attend all scheduled provider appointments Patient's family to discuss the possibility of Hospice care with patient's provider during next appointment on 12/18/20  Follow up Plan: SW will follow up with patient by phone over the next 7-14 business days    Task: Recognize and Manage Caregiver Stress   Due Date: 04/12/2021  Priority: Routine  Note:   Care Management Activities:    - active listening utilized - caregiver stress acknowledged - decision-making supported - positive reinforcement provided    Notes:       Follow Up Plan: SW will follow up with patient by phone over the next 7-14 business days       [employer],  LCSW Clinical Social Worker  [facility] Care Management [phone]

## 2020-12-14 ENCOUNTER — Telehealth: Payer: Self-pay

## 2020-12-14 ENCOUNTER — Telehealth: Payer: Medicare Other

## 2020-12-14 NOTE — Telephone Encounter (Signed)
  Chronic Care Management   Outreach Note  12/14/2020 Name: Sarah [name] MRN: [id number] DOB: 02-Oct-1941  Primary Care Provider: Jerrol Banana., MD Reason for referral : Chronic Care Management   An unsuccessful telephone outreach was attempted today. [name] was referred to the case management team for assistance with care management and care coordination.     Follow Up Plan:  A HIPAA compliant voice message was left for her caregiver/grandson Sarah [name] today requesting a return call.    [facility] [phone]

## 2020-12-14 NOTE — Telephone Encounter (Signed)
Duplicate: Please disregard

## 2020-12-18 ENCOUNTER — Telehealth (INDEPENDENT_AMBULATORY_CARE_PROVIDER_SITE_OTHER): Payer: Medicare Other | Admitting: Family Medicine

## 2020-12-18 DIAGNOSIS — G301 Alzheimer's disease with late onset: Secondary | ICD-10-CM

## 2020-12-18 DIAGNOSIS — F0281 Dementia in other diseases classified elsewhere with behavioral disturbance: Secondary | ICD-10-CM

## 2020-12-18 DIAGNOSIS — N183 Chronic kidney disease, stage 3 unspecified: Secondary | ICD-10-CM

## 2020-12-18 DIAGNOSIS — R296 Repeated falls: Secondary | ICD-10-CM

## 2020-12-18 NOTE — Progress Notes (Signed)
Established patient visit   Patient: Sarah [name]   DOB: [birth year]   80 y.o. Female  MRN: [id number] Visit Date: 12/18/2020  Today's healthcare provider: Wilhemena Durie, MD   No chief complaint on file.  Subjective    HPI  This is a house call for this delightful 80 year old lady with progressive dementia with behavioral disturbance. The son and grandson of called saying that she has been in the bed for a week.  She fell about a week ago and bruised her right eye in her nose but that is resolved.  He has not complained about any back or leg pain. She is eating as I am visiting the house.  She is pleasant and cooperative but not alert at all.  He does know her sons and she knows who I am.     Medications: Outpatient Medications Prior to Visit  Medication Sig  . acetaminophen (TYLENOL) 500 MG tablet Take 500 mg by mouth at bedtime.   . ARIPiprazole (ABILIFY) 2 MG tablet Take 1 tablet (2 mg total) by mouth daily.  Marland Kitchen CALCIUM CARBONATE-VITAMIN D PO Take 1 tablet by mouth 4 (four) times a week.   . cholecalciferol (VITAMIN D) 1000 units tablet Take 1,000 Units by mouth 4 (four) times a week.   . Cobalamin Combinations (FOLTRATE) 500-1 MCG-MG TABS Take by mouth.   . cyanocobalamin 1000 MCG tablet Take 1,000 mcg by mouth 4 (four) times a week.   Marland Kitchen lisinopril (ZESTRIL) 20 MG tablet Take 1 tablet by mouth once daily  . Probiotic CAPS Take 1 capsule by mouth daily.  . vitamin C (ASCORBIC ACID) 500 MG tablet Take 500 mg by mouth daily.  . vitamin E 400 UNIT capsule Take 400 Units by mouth 4 (four) times a week.    Facility-Administered Medications Prior to Visit  Medication Dose Route Frequency Provider  . lidocaine (PF) (XYLOCAINE) 1 % injection 2 mL  2 mL Intradermal Once Virginia Crews, MD  . lidocaine (PF) (XYLOCAINE) 1 % injection 2 mL  2 mL Intradermal Once Virginia Crews, MD  . lidocaine (PF) (XYLOCAINE) 1 % injection 2 mL  2 mL Intradermal Once Virginia Crews, MD  . lidocaine (PF) (XYLOCAINE) 1 % injection 2 mL  2 mL Intradermal Once Virginia Crews, MD  . methylPREDNISolone acetate (DEPO-MEDROL) injection 40 mg  40 mg Intramuscular Once Virginia Crews, MD  . methylPREDNISolone acetate (DEPO-MEDROL) injection 40 mg  40 mg Intramuscular Once Virginia Crews, MD    Review of Systems      Objective    There were no vitals taken for this visit. Patient is a cachectic pleasant female laying on the bed in no acute distress.   Physical Exam  Patient is a pleasant cachectic female laying on the bed in no acute distress.  She is breathing easily and knows me and her son and grandson but otherwise is confused.  She is eating some food and is able to drink liquids.  She is able to move her legs. HEENT is atraumatic normocephalic neck supple without mass CLR regular rate rhythm lungs clear abdomen soft she is able to wiggle her toes and move her legs a bit. She has no pain with movement of her legs and no pain with moving her pelvis.  No results found for any visits on 12/18/20.  Assessment & Plan     1. Late onset Alzheimer's dementia with behavioral disturbance ([place]) Per  discussion with son and grandson will see with home hospice referral. They understand we will keep patient comfortable. - Ambulatory referral to Hospice  2. Frequent falls No signs of obvious trauma today.  No imaging at this time. Patient will benefit from a wheelchair and bedside toilet.  3. Stage 3 chronic kidney disease, unspecified whether stage 3a or 3b CKD ([place])    No follow-ups on file.      I, Wilhemena Durie, MD, have reviewed all documentation for this visit. The documentation on 12/22/20 for the exam, diagnosis, procedures, and orders are all accurate and complete.    Richard Cranford Mon, MD  [facility] [phone] (phone) [phone] (fax)  [place]

## 2020-12-20 ENCOUNTER — Telehealth: Payer: Self-pay | Admitting: Family Medicine

## 2020-12-20 NOTE — Telephone Encounter (Signed)
Sarah [name] (Patient) Sarah [name] (Patient) Quick Communication - See Telephone Encounter  Summary: CB quick confirmation only/ Pt needs to be placed in Hospice, confirm terminal DR Rosanna Randy  CRM for notification. See Telephone encounter for: 12/20/20. Bonnie from [facility] calling only needing a quick confirmation from Dr Darnell Level re this pt is terminal. Really wanted to get her switch over. Call Horris Latino [phone].

## 2020-12-24 ENCOUNTER — Ambulatory Visit: Payer: Medicare Other | Admitting: Family Medicine

## 2020-12-25 ENCOUNTER — Telehealth: Payer: Self-pay

## 2020-12-25 NOTE — Telephone Encounter (Signed)
  Chronic Care Management   Outreach Note  12/25/2020 Name: [name] MRN: [id number] DOB: [birth year]   Primary Care Provider: Jerrol Banana., MD Reason for referral : Chronic Care Management   A second unsuccessful telephone outreach was attempted today. Ms. Serafin was referred to the case management team for assistance with care management and care coordination.     Follow Up Plan:  A HIPAA compliant voice message was left today requesting a return call.    C[facility]y [facility] [phone]

## 2020-12-27 ENCOUNTER — Other Ambulatory Visit: Payer: Self-pay | Admitting: Family Medicine

## 2020-12-27 ENCOUNTER — Ambulatory Visit (INDEPENDENT_AMBULATORY_CARE_PROVIDER_SITE_OTHER): Admitting: *Deleted

## 2020-12-27 DIAGNOSIS — G301 Alzheimer's disease with late onset: Secondary | ICD-10-CM | POA: Diagnosis not present

## 2020-12-27 DIAGNOSIS — I10 Essential (primary) hypertension: Secondary | ICD-10-CM

## 2020-12-27 DIAGNOSIS — R296 Repeated falls: Secondary | ICD-10-CM

## 2020-12-27 DIAGNOSIS — F0281 Dementia in other diseases classified elsewhere with behavioral disturbance: Secondary | ICD-10-CM | POA: Diagnosis not present

## 2020-12-27 DIAGNOSIS — N183 Chronic kidney disease, stage 3 unspecified: Secondary | ICD-10-CM

## 2020-12-27 NOTE — Chronic Care Management (AMB) (Signed)
Chronic Care Management    Clinical Social Work Note  12/27/2020 Name: Sarah [name] MRN: [id number] DOB: 17-Aug-1941  Sarah [name] is a 80 y.o. year old female who is a primary care patient of Jerrol Banana., MD. The CCM team was consulted to assist the patient with chronic disease management and/or care coordination needs related to: [employer] .   Engaged with patient's grandson  by telephone for follow up visit in response to provider referral for social work chronic care management and care coordination services.   Consent to Services:  The patient was given information about Chronic Care Management services, agreed to services, and gave verbal consent prior to initiation of services.  Please see initial visit note for detailed documentation.   Patient agreed to services and consent obtained.   Assessment: Review of patient past medical history, allergies, medications, and health status, including review of relevant consultants reports was performed today as part of a comprehensive evaluation and provision of chronic care management and care coordination services.     SDOH (Social Determinants of Health) assessments and interventions performed:    Advanced Directives Status: Not addressed in this encounter.  CCM Care Plan  Allergies  Allergen Reactions  . Penicillins Hives and Other (See Comments)    Has patient had a PCN reaction causing immediate rash, facial/tongue/throat swelling, SOB or lightheadedness with hypotension: No Has patient had a PCN reaction causing severe rash involving mucus membranes or skin necrosis: No Has patient had a PCN reaction that required hospitalization: No Has patient had a PCN reaction occurring within the last 10 years: No  If all of the above answers are "NO", then may proceed with Cephalosporin use.     Outpatient Encounter Medications as of 12/27/2020  Medication Sig Note  . acetaminophen (TYLENOL) 500 MG tablet Take 500 mg  by mouth at bedtime.  06/26/2020: As needed  . ARIPiprazole (ABILIFY) 2 MG tablet Take 1 tablet (2 mg total) by mouth daily.   Marland Kitchen CALCIUM CARBONATE-VITAMIN D PO Take 1 tablet by mouth 4 (four) times a week.    . cholecalciferol (VITAMIN D) 1000 units tablet Take 1,000 Units by mouth 4 (four) times a week.    . Cobalamin Combinations (FOLTRATE) 500-1 MCG-MG TABS Take by mouth.    . cyanocobalamin 1000 MCG tablet Take 1,000 mcg by mouth 4 (four) times a week.    Marland Kitchen lisinopril (ZESTRIL) 20 MG tablet Take 1 tablet by mouth once daily   . Probiotic CAPS Take 1 capsule by mouth daily.   . vitamin C (ASCORBIC ACID) 500 MG tablet Take 500 mg by mouth daily.   . vitamin E 400 UNIT capsule Take 400 Units by mouth 4 (four) times a week.     Facility-Administered Encounter Medications as of 12/27/2020  Medication  . lidocaine (PF) (XYLOCAINE) 1 % injection 2 mL  . lidocaine (PF) (XYLOCAINE) 1 % injection 2 mL  . lidocaine (PF) (XYLOCAINE) 1 % injection 2 mL  . lidocaine (PF) (XYLOCAINE) 1 % injection 2 mL  . methylPREDNISolone acetate (DEPO-MEDROL) injection 40 mg  . methylPREDNISolone acetate (DEPO-MEDROL) injection 40 mg    Patient Active Problem List   Diagnosis Date Noted  . Malnutrition of moderate degree ([place]) 01/25/2020  . Chronic kidney disease due to hypertension 07/24/2019  . Mesenteric artery stenosis ([place]) 03/24/2019  . Renal artery stenosis ([place]) 01/26/2017  . Personal history of tobacco use, presenting hazards to health 06/23/2016  . Depressive disorder 05/29/2015  .  Dementia ([place]) 05/29/2015  . Allergic rhinitis 03/05/2015  . Weak pulse 03/05/2015  . Smokes tobacco daily 03/05/2015  . Generalized anxiety disorder 03/05/2015  . Acid reflux 03/05/2015  . HLD (hyperlipidemia) 03/05/2015  . Affective disorder, major 03/05/2015  . Bad memory 03/05/2015  . Arthritis, degenerative 03/05/2015  . OP (osteoporosis) 03/05/2015  . Renovascular hypertension 03/05/2015  . Scoliosis  03/05/2015  . Avitaminosis D 03/05/2015  . Weight loss 11/07/2014  . Family history of colon cancer 11/07/2014    Conditions to be addressed/monitored:  Level of care concerns-in home hospice care  Care Plan : General Social Work (Adult)  Updates made by Vern Claude, LCSW since 12/27/2020 12:00 AM    Problem: Caregiver Stress     Goal: Caregiver Coping Optimized   Start Date: 12/13/2020  Expected End Date: 04/12/2021  Recent Progress: On track  Priority: High  Note:   Current Barriers:  . Level of care concerns, Cognitive Deficits, Inability to perform ADL's independently, and Inability to perform IADL's independently . Unable to perform ADLs independently . Unable to perform IADLs independently  Clinical Social Work Clinical Goal(s):  Marland Kitchen Over the next 30 days, patient's grandson will follow up with providers office regarding possible referral for Hospice  Interventions: . 1:1 collaboration with Jerrol Banana., MD regarding development and update of comprehensive plan of care as evidenced by provider attestation and co-signature . Inter-disciplinary care team collaboration (see longitudinal plan of care) . Patient 's grandson interviewed and appropriate assessments performed . Patient grandson confirmed that patient is now followed by Hospice at home-patient admitted to Hospice on 12/21/20 . Patient's grandson states that Hospice services started last week-no further needs verbalized at this time . Discussed plans with patient's grandson for ongoing care management follow up and provided patient with direct contact information for care management team if needed in the future.  Patient Goals/Self-Care Activities Over the next  30 days, patient will:   - Patient will continue to be followed by Hospice  Follow up Plan: Client's grandson will call this social worker with any additional community resource needs.    Task: Recognize and Manage Caregiver Stress   Due Date:  04/12/2021  Priority: Routine  Note:   Notes:       Follow Up Plan: Patient's grandson to contact this Education officer, museum with any additional community resource needs       [place], [place] Worker  [place] Management [phone]

## 2020-12-27 NOTE — Patient Instructions (Signed)
Visit Information  Goals Addressed            This Visit's Progress   . Find Help in My Community       Timeframe:  Short-Term Goal Priority:  High Start Date:      12/13/20                       Expected End Date: 05/07/21                      Follow Up Date 12/27/2020   - follow-up on any referrals for help I am given i.e Hospice Care - think ahead to make sure my need does not become an emergency - have a back-up plan    Why is this important?    Knowing how and where to find help for yourself or family in your neighborhood and community is an important skill.   You will want to take some steps to learn how.    Notes:        The patient verbalized understanding of instructions, educational materials, and care plan provided today and declined offer to receive copy of patient instructions, educational materials, and care plan.   No further follow up required: patient now being followed by [facility], [place] Worker  [place] Management [phone]

## 2021-01-15 ENCOUNTER — Other Ambulatory Visit: Payer: Self-pay

## 2021-01-15 ENCOUNTER — Inpatient Hospital Stay
Admission: EM | Admit: 2021-01-15 | Discharge: 2021-01-19 | DRG: 689 | Disposition: A | Discharge status: Hospice | Attending: Internal Medicine | Admitting: Internal Medicine

## 2021-01-15 DIAGNOSIS — Z20822 Contact with and (suspected) exposure to covid-19: Secondary | ICD-10-CM | POA: Diagnosis present

## 2021-01-15 DIAGNOSIS — Z961 Presence of intraocular lens: Secondary | ICD-10-CM | POA: Diagnosis present

## 2021-01-15 DIAGNOSIS — Z9049 Acquired absence of other specified parts of digestive tract: Secondary | ICD-10-CM

## 2021-01-15 DIAGNOSIS — Z6820 Body mass index (BMI) 20.0-20.9, adult: Secondary | ICD-10-CM

## 2021-01-15 DIAGNOSIS — Z743 Need for continuous supervision: Secondary | ICD-10-CM | POA: Diagnosis not present

## 2021-01-15 DIAGNOSIS — R55 Syncope and collapse: Secondary | ICD-10-CM | POA: Diagnosis present

## 2021-01-15 DIAGNOSIS — I129 Hypertensive chronic kidney disease with stage 1 through stage 4 chronic kidney disease, or unspecified chronic kidney disease: Secondary | ICD-10-CM | POA: Diagnosis present

## 2021-01-15 DIAGNOSIS — M1990 Unspecified osteoarthritis, unspecified site: Secondary | ICD-10-CM | POA: Diagnosis present

## 2021-01-15 DIAGNOSIS — Z800 Family history of malignant neoplasm of digestive organs: Secondary | ICD-10-CM

## 2021-01-15 DIAGNOSIS — Z79899 Other long term (current) drug therapy: Secondary | ICD-10-CM

## 2021-01-15 DIAGNOSIS — Z87891 Personal history of nicotine dependence: Secondary | ICD-10-CM

## 2021-01-15 DIAGNOSIS — G309 Alzheimer's disease, unspecified: Secondary | ICD-10-CM | POA: Diagnosis present

## 2021-01-15 DIAGNOSIS — E43 Unspecified severe protein-calorie malnutrition: Secondary | ICD-10-CM | POA: Insufficient documentation

## 2021-01-15 DIAGNOSIS — E876 Hypokalemia: Secondary | ICD-10-CM | POA: Diagnosis present

## 2021-01-15 DIAGNOSIS — R778 Other specified abnormalities of plasma proteins: Secondary | ICD-10-CM

## 2021-01-15 DIAGNOSIS — R7989 Other specified abnormal findings of blood chemistry: Secondary | ICD-10-CM

## 2021-01-15 DIAGNOSIS — Z681 Body mass index (BMI) 19 or less, adult: Secondary | ICD-10-CM

## 2021-01-15 DIAGNOSIS — R404 Transient alteration of awareness: Secondary | ICD-10-CM | POA: Diagnosis not present

## 2021-01-15 DIAGNOSIS — I248 Other forms of acute ischemic heart disease: Secondary | ICD-10-CM | POA: Diagnosis present

## 2021-01-15 DIAGNOSIS — N390 Urinary tract infection, site not specified: Secondary | ICD-10-CM | POA: Diagnosis not present

## 2021-01-15 DIAGNOSIS — F0280 Dementia in other diseases classified elsewhere without behavioral disturbance: Secondary | ICD-10-CM | POA: Diagnosis present

## 2021-01-15 DIAGNOSIS — N1832 Chronic kidney disease, stage 3b: Secondary | ICD-10-CM

## 2021-01-15 DIAGNOSIS — Z9842 Cataract extraction status, left eye: Secondary | ICD-10-CM

## 2021-01-15 DIAGNOSIS — R6889 Other general symptoms and signs: Secondary | ICD-10-CM | POA: Diagnosis not present

## 2021-01-15 DIAGNOSIS — Z9841 Cataract extraction status, right eye: Secondary | ICD-10-CM

## 2021-01-15 DIAGNOSIS — B9620 Unspecified Escherichia coli [E. coli] as the cause of diseases classified elsewhere: Secondary | ICD-10-CM | POA: Diagnosis present

## 2021-01-15 DIAGNOSIS — R4020 Unspecified coma: Secondary | ICD-10-CM | POA: Diagnosis not present

## 2021-01-15 DIAGNOSIS — F0390 Unspecified dementia without behavioral disturbance: Secondary | ICD-10-CM | POA: Diagnosis present

## 2021-01-15 DIAGNOSIS — Z820 Family history of epilepsy and other diseases of the nervous system: Secondary | ICD-10-CM

## 2021-01-15 DIAGNOSIS — R64 Cachexia: Secondary | ICD-10-CM | POA: Diagnosis present

## 2021-01-15 DIAGNOSIS — R636 Underweight: Secondary | ICD-10-CM

## 2021-01-15 DIAGNOSIS — E785 Hyperlipidemia, unspecified: Secondary | ICD-10-CM | POA: Diagnosis present

## 2021-01-15 DIAGNOSIS — M069 Rheumatoid arthritis, unspecified: Secondary | ICD-10-CM | POA: Diagnosis present

## 2021-01-15 DIAGNOSIS — Z515 Encounter for palliative care: Secondary | ICD-10-CM

## 2021-01-15 LAB — BASIC METABOLIC PANEL
Anion gap: 16 — ABNORMAL HIGH (ref 5–15)
BUN: 55 mg/dL — ABNORMAL HIGH (ref 8–23)
CO2: 17 mmol/L — ABNORMAL LOW (ref 22–32)
Calcium: 9.9 mg/dL (ref 8.9–10.3)
Chloride: 106 mmol/L (ref 98–111)
Creatinine, Ser: 1.86 mg/dL — ABNORMAL HIGH (ref 0.44–1.00)
GFR, Estimated: 27 mL/min — ABNORMAL LOW (ref >60)
Glucose, Bld: 189 mg/dL — ABNORMAL HIGH (ref 70–99)
Potassium: 3.9 mmol/L (ref 3.5–5.1)
Sodium: 139 mmol/L (ref 135–145)

## 2021-01-15 LAB — CBC WITH DIFFERENTIAL/PLATELET
Abs Immature Granulocytes: 0.06 10*3/uL (ref 0.00–0.07)
Basophils Absolute: 0.1 10*3/uL (ref 0.0–0.1)
Basophils Relative: 1 %
Eosinophils Absolute: 0 10*3/uL (ref 0.0–0.5)
Eosinophils Relative: 0 %
HCT: 40.5 % (ref 36.0–46.0)
Hemoglobin: 12.7 g/dL (ref 12.0–15.0)
Immature Granulocytes: 1 %
Lymphocytes Relative: 14 %
Lymphs Abs: 1.7 10*3/uL (ref 0.7–4.0)
MCH: 27.4 pg (ref 26.0–34.0)
MCHC: 31.4 g/dL (ref 30.0–36.0)
MCV: 87.3 fL (ref 80.0–100.0)
Monocytes Absolute: 0.7 10*3/uL (ref 0.1–1.0)
Monocytes Relative: 6 %
Neutro Abs: 9 10*3/uL — ABNORMAL HIGH (ref 1.7–7.7)
Neutrophils Relative %: 78 %
Platelets: 289 10*3/uL (ref 150–400)
RBC: 4.64 MIL/uL (ref 3.87–5.11)
RDW: 14.4 % (ref 11.5–15.5)
WBC: 11.5 10*3/uL — ABNORMAL HIGH (ref 4.0–10.5)
nRBC: 0 % (ref 0.0–0.2)

## 2021-01-15 LAB — TROPONIN I (HIGH SENSITIVITY): Troponin I (High Sensitivity): 212 ng/L (ref <18)

## 2021-01-15 NOTE — ED Provider Notes (Incomplete)
[facility] Emergency Department Provider Note  {** REMINDER - THIS NOTE IS NOT A FINAL MEDICAL RECORD UNTIL IT IS SIGNED.  UNTIL THEN, THE CONTENT BELOW MAY REFLECT INFORMATION FROM A DOCUMENTATION TEMPLATE, NOT THE ACTUAL PATIENT VISIT. **}  ____________________________________________   I have reviewed the triage vital signs and the nursing notes.   HISTORY  Chief Complaint Altered mental status  History limited by and level 5 caveat due to: Dementia   HPI Sarah [name] is a 80 y.o. female who presents to the emergency department today ***   LOCATION: *** DURATION: *** TIMING: *** SEVERITY: *** QUALITY: *** CONTEXT: *** MODIFYING FACTORS: *** ASSOCIATED SYMPTOMS: ***  Records reviewed. Per medical record review patient has a history of ***  Past Medical History:  Diagnosis Date  . Arthritis   . Dementia ([place])    [place]  . Ear anomaly    INNER EAR ISSUES  . Hyperlipidemia   . Hypertension     Patient Active Problem List   Diagnosis Date Noted  . Malnutrition of moderate degree ([place]) 01/25/2020  . Chronic kidney disease due to hypertension 07/24/2019  . Mesenteric artery stenosis ([place]) 03/24/2019  . Renal artery stenosis ([place]) 01/26/2017  . Personal history of tobacco use, presenting hazards to health 06/23/2016  . Depressive disorder 05/29/2015  . Dementia ([place]) 05/29/2015  . Allergic rhinitis 03/05/2015  . Weak pulse 03/05/2015  . Smokes tobacco daily 03/05/2015  . Generalized anxiety disorder 03/05/2015  . Acid reflux 03/05/2015  . HLD (hyperlipidemia) 03/05/2015  . Affective disorder, major 03/05/2015  . Bad memory 03/05/2015  . Arthritis, degenerative 03/05/2015  . OP (osteoporosis) 03/05/2015  . Renovascular hypertension 03/05/2015  . Scoliosis 03/05/2015  . Avitaminosis D 03/05/2015  . Weight loss 11/07/2014  . Family history of colon cancer 11/07/2014    Past Surgical History:  Procedure Laterality Date  .  APPENDECTOMY    . CATARACT EXTRACTION W/PHACO Right 03/09/2018   Procedure: CATARACT EXTRACTION PHACO AND INTRAOCULAR LENS PLACEMENT (IOC);  Surgeon: Birder Robson, MD;  Location: [facility] ORS;  Service: Ophthalmology;  Laterality: Right;  [place] 00:38.9 AP% 15.9 CDE 6.19 FLUID PACK LOT # [id number]  . CATARACT EXTRACTION W/PHACO Left 06/22/2018   Procedure: CATARACT EXTRACTION PHACO AND INTRAOCULAR LENS PLACEMENT (IOC);  Surgeon: Birder Robson, MD;  Location: [facility] ORS;  Service: Ophthalmology;  Laterality: Left;  [place] 00:50 AP% 16.5 CDE 8.25 Fluid pack lot # [id number]  . CESAREAN SECTION    . CHOLECYSTECTOMY    . CYSTOSCOPY WITH STENT PLACEMENT      Prior to Admission medications   Medication Sig Start Date End Date Taking? Authorizing Provider  acetaminophen (TYLENOL) 500 MG tablet Take 500 mg by mouth at bedtime.     [provider]  ARIPiprazole (ABILIFY) 2 MG tablet Take 1 tablet (2 mg total) by mouth daily. 11/21/20   Jerrol Banana., MD  CALCIUM CARBONATE-VITAMIN D PO Take 1 tablet by mouth 4 (four) times a week.     [provider]  cholecalciferol (VITAMIN D) 1000 units tablet Take 1,000 Units by mouth 4 (four) times a week.     [provider]  Cobalamin Combinations (FOLTRATE) 500-1 MCG-MG TABS Take by mouth.     [provider]  cyanocobalamin 1000 MCG tablet Take 1,000 mcg by mouth 4 (four) times a week.     [provider]  lisinopril (ZESTRIL) 20 MG tablet Take 1 tablet by mouth once daily 12/27/20   Rosanna Randy,  Retia Passe., MD  Probiotic CAPS Take 1 capsule by mouth daily. 07/07/19   [provider]  vitamin C (ASCORBIC ACID) 500 MG tablet Take 500 mg by mouth daily.    [provider]  vitamin E 400 UNIT capsule Take 400 Units by mouth 4 (four) times a week.     [provider]    Allergies Penicillins  Family History  Problem Relation Age of Onset  . Colon cancer Father   . Pancreatic cancer  Sister   . GER disease Son   . Alzheimer's disease Sister     Social History Social History   Tobacco Use  . Smoking status: Former Smoker    Packs/day: 0.50    Years: 45.00    Pack years: 22.50    Types: Cigarettes  . Smokeless tobacco: Never Used  . Tobacco comment: quit 09/2019  Vaping Use  . Vaping Use: Never used  Substance Use Topics  . Alcohol use: No    Alcohol/week: 0.0 standard drinks  . Drug use: No    Review of Systems Constitutional: No fever/chills Eyes: No visual changes. ENT: No sore throat. Cardiovascular: Denies chest pain. Respiratory: Denies shortness of breath. Gastrointestinal: No abdominal pain.  No nausea, no vomiting.  No diarrhea.   Genitourinary: Negative for dysuria. Musculoskeletal: Negative for back pain. Skin: Negative for rash. Neurological: Negative for headaches, focal weakness or numbness.  ____________________________________________   PHYSICAL EXAM:  VITAL SIGNS: ED Triage Vitals [01/15/21 2208]  Enc Vitals Group     BP (!) 176/81     Pulse Rate 77     Resp 11     Temp 97.6 F (36.4 C)     Temp Source Oral     SpO2 96 %     Weight      Height      Head Circumference      [place] Flow      Pain Score      Pain Loc      Pain Edu?      Excl. in [place]?      Constitutional: Alert and oriented.  Eyes: Conjunctivae are normal.  ENT      Head: Normocephalic and atraumatic.      Nose: No congestion/rhinnorhea.      Mouth/Throat: Mucous membranes are moist.      Neck: No stridor. Hematological/Lymphatic/Immunilogical: No cervical lymphadenopathy. Cardiovascular: Normal rate, regular rhythm.  No murmurs, rubs, or gallops. *** Respiratory: Normal respiratory effort without tachypnea nor retractions. Breath sounds are clear and equal bilaterally. No wheezes/rales/rhonchi. Gastrointestinal: Soft and non tender. No rebound. No guarding.  Genitourinary: Deferred Musculoskeletal: Normal range of motion in all extremities. No  lower extremity edema. Neurologic:  Normal speech and language. No gross focal neurologic deficits are appreciated.  Skin:  Skin is warm, dry and intact. No rash noted. Psychiatric: Mood and affect are normal. Speech and behavior are normal. Patient exhibits appropriate insight and judgment.  ____________________________________________    LABS (pertinent positives/negatives)  Trop hs 212 CBC wbc 11.5, hgb 12.7, plt 289 BMP na 139, k 3.9, glu 189, cr 1.86  ____________________________________________   EKG  I, Nance Pear, attending physician, personally viewed and interpreted this EKG  EKG Time: 2316 Rate: 81 Rhythm: sinus rhythm Axis: left axis deviation Intervals: qtc 469 QRS: narrow ST changes: no st elevation Impression: abnormal ekg  ____________________________________________    RADIOLOGY  {Name/None:19197::"None"}  {**I, Nance Pear, personally viewed and evaluated these images (plain radiographs) as  part of my medical decision making.**} ____________________________________________   PROCEDURES  Procedures  ____________________________________________   INITIAL IMPRESSION / ASSESSMENT AND PLAN / ED COURSE  Pertinent labs & imaging results that were available during my care of the patient were reviewed by me and considered in my medical decision making (see chart for details).   ***  Discussed with patient/family results of testing/physical exam, differential plan and return precautions.  ____________________________________________   FINAL CLINICAL IMPRESSION(S) / ED DIAGNOSES  Final diagnoses:  None     Note: This dictation was prepared with Dragon dictation. Any transcriptional errors that result from this process are unintentional

## 2021-01-15 NOTE — ED Notes (Addendum)
Chief Complaint  Patient presents with  . Altered Mental Status    Presenting via EMS from home, pt was eating dinner when pt went "unresponsive". Started waking up in the back of the ambulance. Hx dementia and HTN. Hospice pt per EMS. Normally has conversations with family   Pt's son now at bedside. Son states pt was sitting in her chair at home when she "went out of it". States his brother is the one who witnessed it. States pt's eyes were half way open when it happened but states breathing looked okay. No hx of same. Reports he called hospice company who directed him to call EMS to pick pt up. Pt more alert now, talking with son. He states she looks much better than she did earlier.

## 2021-01-15 NOTE — ED Notes (Signed)
[phone] is on-call number for patient's hospice company

## 2021-01-15 NOTE — ED Provider Notes (Signed)
[facility] Emergency Department Provider Note    ____________________________________________   I have reviewed the triage vital signs and the nursing notes.   HISTORY  Chief Complaint Altered mental status  History limited by and level 5 caveat due to: Dementia   HPI Sarah [name] is a 80 y.o. female who presents to the emergency department today for an apparent episode of being unresponsive.  Family states that the patient was in a wheelchair when she slumped over.  She did start drooling.  They were able to get the patient out of the wheelchair.  She then slowly started becoming back to her baseline and family states by the time EMS got there she seemed more normal.  Family states that the patient does have advanced Alzheimer's.  She is a hospice patient.  They have noticed a increased decline over the past couple of weeks.  Patient is no longer ambulatory.    Records reviewed. Per medical record review patient has a history of dementia.  Past Medical History:  Diagnosis Date  . Arthritis   . Dementia ([place])    ALZHEIMERS  . Ear anomaly    INNER EAR ISSUES  . Hyperlipidemia   . Hypertension     Patient Active Problem List   Diagnosis Date Noted  . Malnutrition of moderate degree ([place]) 01/25/2020  . Chronic kidney disease due to hypertension 07/24/2019  . Mesenteric artery stenosis ([place]) 03/24/2019  . Renal artery stenosis ([place]) 01/26/2017  . Personal history of tobacco use, presenting hazards to health 06/23/2016  . Depressive disorder 05/29/2015  . Dementia ([place]) 05/29/2015  . Allergic rhinitis 03/05/2015  . Weak pulse 03/05/2015  . Smokes tobacco daily 03/05/2015  . Generalized anxiety disorder 03/05/2015  . Acid reflux 03/05/2015  . HLD (hyperlipidemia) 03/05/2015  . Affective disorder, major 03/05/2015  . Bad memory 03/05/2015  . Arthritis, degenerative 03/05/2015  . OP (osteoporosis) 03/05/2015  . Renovascular hypertension  03/05/2015  . Scoliosis 03/05/2015  . Avitaminosis D 03/05/2015  . Weight loss 11/07/2014  . Family history of colon cancer 11/07/2014    Past Surgical History:  Procedure Laterality Date  . APPENDECTOMY    . CATARACT EXTRACTION W/PHACO Right 03/09/2018   Procedure: CATARACT EXTRACTION PHACO AND INTRAOCULAR LENS PLACEMENT (IOC);  Surgeon: Birder Robson, MD;  Location: [facility] ORS;  Service: Ophthalmology;  Laterality: Right;  [place] 00:38.9 AP% 15.9 CDE 6.19 FLUID PACK LOT # [id number]  . CATARACT EXTRACTION W/PHACO Left 06/22/2018   Procedure: CATARACT EXTRACTION PHACO AND INTRAOCULAR LENS PLACEMENT (IOC);  Surgeon: Birder Robson, MD;  Location: [facility] ORS;  Service: Ophthalmology;  Laterality: Left;  [place] 00:50 AP% 16.5 CDE 8.25 Fluid pack lot # [id number]  . CESAREAN SECTION    . CHOLECYSTECTOMY    . CYSTOSCOPY WITH STENT PLACEMENT      Prior to Admission medications   Medication Sig Start Date End Date Taking? Authorizing Provider  acetaminophen (TYLENOL) 500 MG tablet Take 500 mg by mouth at bedtime.     [provider]  ARIPiprazole (ABILIFY) 2 MG tablet Take 1 tablet (2 mg total) by mouth daily. 11/21/20   Jerrol Banana., MD  CALCIUM CARBONATE-VITAMIN D PO Take 1 tablet by mouth 4 (four) times a week.     [provider]  cholecalciferol (VITAMIN D) 1000 units tablet Take 1,000 Units by mouth 4 (four) times a week.     [provider]  Cobalamin Combinations (FOLTRATE) 500-1 MCG-MG TABS Take by mouth.  [provider]  cyanocobalamin 1000 MCG tablet Take 1,000 mcg by mouth 4 (four) times a week.     [provider]  lisinopril (ZESTRIL) 20 MG tablet Take 1 tablet by mouth once daily 12/27/20   Jerrol Banana., MD  Probiotic CAPS Take 1 capsule by mouth daily. 07/07/19   [provider]  vitamin C (ASCORBIC ACID) 500 MG tablet Take 500 mg by mouth daily.    [provider]  vitamin E 400 UNIT capsule Take  400 Units by mouth 4 (four) times a week.     [provider]    Allergies Penicillins  Family History  Problem Relation Age of Onset  . Colon cancer Father   . Pancreatic cancer Sister   . GER disease Son   . Alzheimer's disease Sister     Social History Social History   Tobacco Use  . Smoking status: Former Smoker    Packs/day: 0.50    Years: 45.00    Pack years: 22.50    Types: Cigarettes  . Smokeless tobacco: Never Used  . Tobacco comment: quit 09/2019  Vaping Use  . Vaping Use: Never used  Substance Use Topics  . Alcohol use: No    Alcohol/week: 0.0 standard drinks  . Drug use: No    Review of Systems Unable to obtain secondary to dementia.  ____________________________________________   PHYSICAL EXAM:  VITAL SIGNS: ED Triage Vitals [01/15/21 2208]  Enc Vitals Group     BP (!) 176/81     Pulse Rate 77     Resp 11     Temp 97.6 F (36.4 C)     Temp Source Oral     SpO2 96 %   Constitutional: Awake and alert. Not oriented.  Eyes: Conjunctivae are normal.  ENT      Head: Normocephalic and atraumatic.      Nose: No congestion/rhinnorhea.      Mouth/Throat: Mucous membranes are moist.      Neck: No stridor. Hematological/Lymphatic/Immunilogical: No cervical lymphadenopathy. Cardiovascular: Normal rate, regular rhythm.  No murmurs, rubs, or gallops.  Respiratory: Normal respiratory effort without tachypnea nor retractions. Breath sounds are clear and equal bilaterally. No wheezes/rales/rhonchi. Gastrointestinal: Soft and non tender. No rebound. No guarding.  Genitourinary: Deferred Musculoskeletal: Normal range of motion in all extremities. No lower extremity edema. Neurologic:  Not oriented.  Skin:  Skin is warm, dry and intact. No rash noted. ____________________________________________    LABS (pertinent positives/negatives)  Trop hs 212 CBC wbc 11.5, hgb 12.7, plt 289 BMP na 139, k 3.9, glu 189, cr  1.86  ____________________________________________   EKG  I, Nance Pear, attending physician, personally viewed and interpreted this EKG  EKG Time: 2316 Rate: 81 Rhythm: sinus rhythm Axis: left axis deviation Intervals: qtc 469 QRS: narrow ST changes: no st elevation Impression: abnormal ekg  ____________________________________________    RADIOLOGY  None  ____________________________________________   PROCEDURES  Procedures  ____________________________________________   INITIAL IMPRESSION / ASSESSMENT AND PLAN / ED COURSE  Pertinent labs & imaging results that were available during my care of the patient were reviewed by me and considered in my medical decision making (see chart for details).   Patient presented to the emergency department today because of concerns for an episode of unresponsiveness.  I do think the patient probably suffered a syncopal episode.  Blood work here is notable for an elevated troponin.  I discussed this with the son.  Did discuss goals of care.  At this time he would like patient to be admitted for further cardiac work-up.   ____________________________________________   FINAL CLINICAL IMPRESSION(S) / ED DIAGNOSES  Final diagnoses:  Syncope, unspecified syncope type  Elevated troponin     Note: This dictation was prepared with Dragon dictation. Any transcriptional errors that result from this process are unintentional     Nance Pear, MD 01/16/21 [phone]

## 2021-01-16 ENCOUNTER — Other Ambulatory Visit: Payer: Self-pay

## 2021-01-16 ENCOUNTER — Inpatient Hospital Stay (HOSPITAL_COMMUNITY)
Admit: 2021-01-16 | Discharge: 2021-01-16 | Disposition: A | Discharge status: Home / Self Care | Attending: Internal Medicine | Admitting: Internal Medicine

## 2021-01-16 ENCOUNTER — Telehealth: Payer: Self-pay

## 2021-01-16 ENCOUNTER — Observation Stay

## 2021-01-16 DIAGNOSIS — G309 Alzheimer's disease, unspecified: Secondary | ICD-10-CM | POA: Diagnosis present

## 2021-01-16 DIAGNOSIS — Z79899 Other long term (current) drug therapy: Secondary | ICD-10-CM | POA: Diagnosis not present

## 2021-01-16 DIAGNOSIS — M069 Rheumatoid arthritis, unspecified: Secondary | ICD-10-CM | POA: Diagnosis present

## 2021-01-16 DIAGNOSIS — E785 Hyperlipidemia, unspecified: Secondary | ICD-10-CM | POA: Diagnosis present

## 2021-01-16 DIAGNOSIS — Z9841 Cataract extraction status, right eye: Secondary | ICD-10-CM | POA: Diagnosis not present

## 2021-01-16 DIAGNOSIS — R55 Syncope and collapse: Secondary | ICD-10-CM

## 2021-01-16 DIAGNOSIS — Z9842 Cataract extraction status, left eye: Secondary | ICD-10-CM | POA: Diagnosis not present

## 2021-01-16 DIAGNOSIS — M1990 Unspecified osteoarthritis, unspecified site: Secondary | ICD-10-CM | POA: Diagnosis present

## 2021-01-16 DIAGNOSIS — Z6820 Body mass index (BMI) 20.0-20.9, adult: Secondary | ICD-10-CM | POA: Diagnosis not present

## 2021-01-16 DIAGNOSIS — Z20822 Contact with and (suspected) exposure to covid-19: Secondary | ICD-10-CM | POA: Diagnosis present

## 2021-01-16 DIAGNOSIS — Z515 Encounter for palliative care: Secondary | ICD-10-CM

## 2021-01-16 DIAGNOSIS — R404 Transient alteration of awareness: Secondary | ICD-10-CM | POA: Diagnosis not present

## 2021-01-16 DIAGNOSIS — F0281 Dementia in other diseases classified elsewhere with behavioral disturbance: Secondary | ICD-10-CM

## 2021-01-16 DIAGNOSIS — I248 Other forms of acute ischemic heart disease: Secondary | ICD-10-CM | POA: Diagnosis present

## 2021-01-16 DIAGNOSIS — G301 Alzheimer's disease with late onset: Secondary | ICD-10-CM | POA: Diagnosis not present

## 2021-01-16 DIAGNOSIS — Z961 Presence of intraocular lens: Secondary | ICD-10-CM | POA: Diagnosis present

## 2021-01-16 DIAGNOSIS — R7989 Other specified abnormal findings of blood chemistry: Secondary | ICD-10-CM

## 2021-01-16 DIAGNOSIS — N390 Urinary tract infection, site not specified: Secondary | ICD-10-CM | POA: Diagnosis present

## 2021-01-16 DIAGNOSIS — E876 Hypokalemia: Secondary | ICD-10-CM | POA: Diagnosis present

## 2021-01-16 DIAGNOSIS — Z820 Family history of epilepsy and other diseases of the nervous system: Secondary | ICD-10-CM | POA: Diagnosis not present

## 2021-01-16 DIAGNOSIS — Z800 Family history of malignant neoplasm of digestive organs: Secondary | ICD-10-CM | POA: Diagnosis not present

## 2021-01-16 DIAGNOSIS — R64 Cachexia: Secondary | ICD-10-CM | POA: Diagnosis present

## 2021-01-16 DIAGNOSIS — Z743 Need for continuous supervision: Secondary | ICD-10-CM | POA: Diagnosis not present

## 2021-01-16 DIAGNOSIS — Z9049 Acquired absence of other specified parts of digestive tract: Secondary | ICD-10-CM | POA: Diagnosis not present

## 2021-01-16 DIAGNOSIS — E43 Unspecified severe protein-calorie malnutrition: Secondary | ICD-10-CM | POA: Diagnosis present

## 2021-01-16 DIAGNOSIS — N1832 Chronic kidney disease, stage 3b: Secondary | ICD-10-CM | POA: Diagnosis present

## 2021-01-16 DIAGNOSIS — R636 Underweight: Secondary | ICD-10-CM

## 2021-01-16 DIAGNOSIS — M2550 Pain in unspecified joint: Secondary | ICD-10-CM | POA: Diagnosis not present

## 2021-01-16 DIAGNOSIS — R778 Other specified abnormalities of plasma proteins: Secondary | ICD-10-CM

## 2021-01-16 DIAGNOSIS — Z7401 Bed confinement status: Secondary | ICD-10-CM | POA: Diagnosis not present

## 2021-01-16 DIAGNOSIS — B9620 Unspecified Escherichia coli [E. coli] as the cause of diseases classified elsewhere: Secondary | ICD-10-CM | POA: Diagnosis present

## 2021-01-16 DIAGNOSIS — I129 Hypertensive chronic kidney disease with stage 1 through stage 4 chronic kidney disease, or unspecified chronic kidney disease: Secondary | ICD-10-CM | POA: Diagnosis present

## 2021-01-16 DIAGNOSIS — Z681 Body mass index (BMI) 19 or less, adult: Secondary | ICD-10-CM | POA: Diagnosis not present

## 2021-01-16 DIAGNOSIS — Z87891 Personal history of nicotine dependence: Secondary | ICD-10-CM | POA: Diagnosis not present

## 2021-01-16 LAB — URINALYSIS, COMPLETE (UACMP) WITH MICROSCOPIC
Bilirubin Urine: NEGATIVE
Glucose, UA: NEGATIVE mg/dL
Ketones, ur: 5 mg/dL — AB
Nitrite: NEGATIVE
Protein, ur: 100 mg/dL — AB
Specific Gravity, Urine: 1.013 (ref 1.005–1.030)
WBC, UA: >50 WBC/hpf — ABNORMAL HIGH (ref 0–5)
pH: 5 (ref 5.0–8.0)

## 2021-01-16 LAB — TROPONIN I (HIGH SENSITIVITY): Troponin I (High Sensitivity): 194 ng/L (ref <18)

## 2021-01-16 LAB — CBG MONITORING, ED: Glucose-Capillary: 121 mg/dL — ABNORMAL HIGH (ref 70–99)

## 2021-01-16 LAB — SARS CORONAVIRUS 2 (TAT 6-24 HRS): SARS Coronavirus 2: NEGATIVE

## 2021-01-16 MED ORDER — ACETAMINOPHEN 650 MG RE SUPP: 650.0000 mg | Freq: Four times a day (QID) | RECTAL | Status: DC | PRN

## 2021-01-16 MED ORDER — SODIUM CHLORIDE 0.9 % IV SOLN
1.0000 g | INTRAVENOUS | Status: DC
  Administered 2021-01-17 – 2021-01-18 (×2): 1 g via INTRAVENOUS
  Filled 2021-01-16 (×3): qty 10

## 2021-01-16 MED ORDER — ENOXAPARIN SODIUM 30 MG/0.3ML ~~LOC~~ SOLN
30.0000 mg | SUBCUTANEOUS | Status: DC
  Administered 2021-01-16 – 2021-01-17 (×2): 30 mg via SUBCUTANEOUS
  Filled 2021-01-16 (×2): qty 0.3

## 2021-01-16 MED ORDER — ASPIRIN 81 MG PO CHEW
324.0000 mg | CHEWABLE_TABLET | Freq: Once | ORAL | Status: AC
  Administered 2021-01-16: 324 mg via ORAL
  Filled 2021-01-16: qty 4

## 2021-01-16 MED ORDER — SODIUM CHLORIDE 0.9 % IV SOLN: INTRAVENOUS | Status: DC

## 2021-01-16 MED ORDER — ACETAMINOPHEN 325 MG PO TABS: 650.0000 mg | ORAL_TABLET | Freq: Four times a day (QID) | ORAL | Status: DC | PRN

## 2021-01-16 MED ORDER — ONDANSETRON HCL 4 MG/2ML IJ SOLN: 4.0000 mg | Freq: Four times a day (QID) | INTRAMUSCULAR | Status: DC | PRN

## 2021-01-16 MED ORDER — SODIUM CHLORIDE 0.9 % IV SOLN
1.0000 g | Freq: Once | INTRAVENOUS | Status: AC
  Administered 2021-01-16: 1 g via INTRAVENOUS
  Filled 2021-01-16: qty 10

## 2021-01-16 MED ORDER — ONDANSETRON HCL 4 MG PO TABS: 4.0000 mg | ORAL_TABLET | Freq: Four times a day (QID) | ORAL | Status: DC | PRN

## 2021-01-16 MED ORDER — SODIUM CHLORIDE 0.9% FLUSH
3.0000 mL | Freq: Two times a day (BID) | INTRAVENOUS | Status: DC
  Administered 2021-01-16 – 2021-01-19 (×7): 3 mL via INTRAVENOUS

## 2021-01-16 NOTE — Progress Notes (Signed)
Patient resting quietly with eyes closed at this time. Kieth Brightly, RN (hospice) in to see patient. VSS.

## 2021-01-16 NOTE — Progress Notes (Signed)
[facility]) Hospital Liaison Hospitalized Hospice Patient RN note:  Sarah [name] is a current hospice patient with a terminal diagnosis of Alzheimers. She was brought to the emergency room after an episode at home in which she slumped forward in her wheelchair and was drooling.  The episode lasted just a few minutes and she started coming around. Her son contacted 911 and notified [facility] that patient was being transported to [facility]. She was previously in her usual state of health. Her blood work was significant for WBC 11,000 and  Troponin was 212/194.  Urinalysis with few bacteria and large leukocyte esterase. She was admitted to [facility] at 08:35 am with a diagnosis or Syncope and elevated Troponin. Per Dr. Gilford Rile with [facility], this is a related admission. She is a Full code.  Visited patient at bedside. Per bedside RN, Vikki Ports,  patient was agitated and did not sleep during the night. Patient was resting quietly and asleep with no distress noted. RN did not awake patient. Report exchanged with hospital care team. Spoke with patient's son, Cassandria Santee to provide update.  He verbalized understanding and had no questions.  Vital Signs: BP 149/98; HR 63; Resp 16; temp 97.8; O2 sat 100% on RA  I&O: 631ml/none charted  Abnormal labs: CO2: 17 (L) Glucose: 189 (H) BUN: 55 (H) Creatinine: 1.86 (H) Anion gap: 16 (H) GFR, Estimated: 27 (L) Troponin I (High Sensitivity): 212 (HH) WBC: 11.5 (H)  Diagnostics: CXR IMPRESSION: No active disease.  IV/PRN Meds: 0.9 % sodium chloride infusion Rate: 75 mL/hr Freq: Continuous Route: IV Start: 01/16/21 0330 End: 01/26/21 0329  cefTRIAXone (ROCEPHIN) 1 g in sodium chloride 0.9 % 100 mL IVPB Dose: 1 g Freq: Every 24 hours Route: IV Start: 01/16/21 2300  Problem List: Principal Problem:   Syncope Active Problems:   Arthritis, degenerative   Dementia (HCC)   Underweight   Hospice care   Elevated troponin    UTI (urinary tract infection)   Stage 3b chronic kidney disease (HCC)  Syncope: etiology unclear, possibly secondary to UTI. Continue on tele. CXR shows no active disease. Echo was ordered.   Elevated troponin: likely secondary to demand ischemia  UTI: continue on IV rocephin. Urine cx is pending   Arthritis: etiology unclear, RA vs OA.   Dementia: continue w/ hospice care   Underweight: BMI 19.0. Continue w/ hospice care   CKDIIIb: Cr is trending down from day prior. Avoid nephrotoxic meds   Discharge Planning: Ongoing  Family Contact: Spoke with son, Cassandria Santee over the phone.  IDG: Updated  Goals of Care: Clear  Medication list and Transfer Summary placed on Shadow Chart.  Please call with any hospice related questions or concerns.  Zandra Abts, RN [facility] Liaison  [phone]

## 2021-01-16 NOTE — Progress Notes (Signed)
Pt has hx of dementia , unable to complete admission profile. Attempted to call son and grandson listed on emergency contacts however no answer.

## 2021-01-16 NOTE — Progress Notes (Signed)
PHARMACIST - PHYSICIAN COMMUNICATION  CONCERNING:  Enoxaparin (Lovenox) for DVT Prophylaxis    RECOMMENDATION: Patient was prescribed enoxaprin 40mg  q24 hours for VTE prophylaxis.   Filed Weights   01/15/21 2313  Weight: 38.6 kg (85 lb)    Body mass index is 19.06 kg/m.  Estimated Creatinine Clearance: 13.6 mL/min (A) (by C-G formula based on SCr of 1.86 mg/dL (H)).   Patient is candidate for enoxaparin 30mg  every 24 hours based on CrCl <30ml/min or Weight <45kg  DESCRIPTION: Pharmacy has adjusted enoxaparin dose per [facility] policy.  Patient is now receiving enoxaparin 30 mg every 24 hours   Renda Rolls, PharmD, [facility] 01/16/2021 3:26 AM

## 2021-01-16 NOTE — H&P (Signed)
History and Physical    Sarah [name] ZOX:[id number] DOB: July 17, 1941 DOA: 01/15/2021  PCP: Jerrol Banana., MD   Patient coming from: Home  I have personally briefly reviewed patient's old medical records in [place]  Chief Complaint: Loss of consciousness  HPI: Sarah [name] is a 80 y.o. female with medical history significant for Dementia, CKD 3B in-home hospice but still a full code, arthritis who was brought to the emergency room after an episode at home in which she slumped forward in her wheelchair and was drooling.  The episode lasted just a few minutes and she started coming around.  She was not previously ill and has had no cough, fever or chills, no nausea or vomiting or diarrhea and no complaints of chest pain or shortness of breath.  She was previously in her usual state of health. ED course: On arrival BP 176/81, pulse 77, respirations 11 with O2 sat 96% on room air and temperature 97.6 blood work significant for WBC 11,000, creatinine 1.86 which is her baseline, BUN 55.  Blood glucose 189.  Troponin was 212/194.  Urinalysis with few bacteria and large leukocyte esterase. EKG as interpreted by me: Sinus rhythm at 81 with no acute ST-T wave changes Imaging: Chest x-ray pending  Patient started on Rocephin.  Son at bedside wants patient to receive the full spectrum of care.  Hospitalist consulted for admission.  Review of Systems: Unobtainable due to dementia  Past Medical History:  Diagnosis Date  . Arthritis   . Dementia ([employer])    ALZHEIMERS  . Ear anomaly    INNER EAR ISSUES  . Hyperlipidemia   . Hypertension     Past Surgical History:  Procedure Laterality Date  . APPENDECTOMY    . CATARACT EXTRACTION W/PHACO Right 03/09/2018   Procedure: CATARACT EXTRACTION PHACO AND INTRAOCULAR LENS PLACEMENT (IOC);  Surgeon: Birder Robson, MD;  Location: [facility] ORS;  Service: Ophthalmology;  Laterality: Right;  [place] 00:38.9 AP% 15.9 CDE 6.19 FLUID PACK LOT #  [id number]  . CATARACT EXTRACTION W/PHACO Left 06/22/2018   Procedure: CATARACT EXTRACTION PHACO AND INTRAOCULAR LENS PLACEMENT (IOC);  Surgeon: Birder Robson, MD;  Location: [facility] ORS;  Service: Ophthalmology;  Laterality: Left;  [place] 00:50 AP% 16.5 CDE 8.25 Fluid pack lot # [id number]  . CESAREAN SECTION    . CHOLECYSTECTOMY    . CYSTOSCOPY WITH STENT PLACEMENT       reports that she has quit smoking. Her smoking use included cigarettes. She has a 22.50 pack-year smoking history. She has never used smokeless tobacco. She reports that she does not drink alcohol and does not use drugs.  Allergies  Allergen Reactions  . Penicillins Hives and Other (See Comments)    Has patient had a PCN reaction causing immediate rash, facial/tongue/throat swelling, SOB or lightheadedness with hypotension: No Has patient had a PCN reaction causing severe rash involving mucus membranes or skin necrosis: No Has patient had a PCN reaction that required hospitalization: No Has patient had a PCN reaction occurring within the last 10 years: No  If all of the above answers are "NO", then may proceed with Cephalosporin use.     Family History  Problem Relation Age of Onset  . Colon cancer Father   . Pancreatic cancer Sister   . GER disease Son   . Alzheimer's disease Sister       Prior to Admission medications   Medication Sig Start Date End Date Taking? Authorizing Provider  acetaminophen (TYLENOL)  500 MG tablet Take 500 mg by mouth at bedtime.     [provider]  ARIPiprazole (ABILIFY) 2 MG tablet Take 1 tablet (2 mg total) by mouth daily. 11/21/20   Jerrol Banana., MD  CALCIUM CARBONATE-VITAMIN D PO Take 1 tablet by mouth 4 (four) times a week.     [provider]  cholecalciferol (VITAMIN D) 1000 units tablet Take 1,000 Units by mouth 4 (four) times a week.     [provider]  Cobalamin Combinations (FOLTRATE) 500-1 MCG-MG TABS Take by mouth.     [provider]  cyanocobalamin 1000 MCG tablet Take 1,000 mcg by mouth 4 (four) times a week.     [provider]  diclofenac Sodium (VOLTAREN) 1 % GEL SMARTSIG:4 Gram(s) Topical Every 6 Hours PRN 12/27/20   [provider]  lisinopril (ZESTRIL) 20 MG tablet Take 1 tablet by mouth once daily 12/27/20   Jerrol Banana., MD  Probiotic CAPS Take 1 capsule by mouth daily. 07/07/19   [provider]  QC GENTLE LAXATIVE 10 MG suppository Place 10 mg rectally daily as needed. 12/27/20   [provider]  Sorbitol 70 % SOLN TAKE 30MLS BY MOUTH EVERY 2 HOURS UNTIL BOWEL MOVEMENT AS DIRECTED 12/27/20   [provider]  vitamin C (ASCORBIC ACID) 500 MG tablet Take 500 mg by mouth daily.    [provider]  vitamin E 400 UNIT capsule Take 400 Units by mouth 4 (four) times a week.     [provider]    Physical Exam: Vitals:   01/15/21 2208 01/15/21 2313 01/15/21 2313  BP: (!) 176/81 (!) 167/83   Pulse: 77 81   Resp: 11 (!) 21   Temp: 97.6 F (36.4 C)    TempSrc: Oral    SpO2: 96% 100%   Weight:   38.6 kg  Height:   4\' 8"  (1.422 m)     Vitals:   01/15/21 2208 01/15/21 2313 01/15/21 2313  BP: (!) 176/81 (!) 167/83   Pulse: 77 81   Resp: 11 (!) 21   Temp: 97.6 F (36.4 C)    TempSrc: Oral    SpO2: 96% 100%   Weight:   38.6 kg  Height:   4\' 8"  (1.422 m)      Constitutional:  Cachectic appearing alert, smiling pleasantly, confused unable to answer questions asked. Not in any apparent distress HEENT:      Head: Normocephalic and atraumatic.         Eyes: PERLA, EOMI, Conjunctivae are normal. Sclera is non-icteric.       Mouth/Throat: Mucous membranes are moist.       Neck: Supple with no signs of meningismus. Cardiovascular: Regular rate and rhythm. No murmurs, gallops, or rubs. 2+ symmetrical distal pulses are present . No JVD. No LE edema Respiratory: Respiratory effort normal .Lungs sounds clear bilaterally. No wheezes, crackles, or  rhonchi.  Gastrointestinal: Soft, non tender, and non distended with positive bowel sounds.  Genitourinary: No CVA tenderness. Musculoskeletal: Temporal wasting with prominence of bony eminences Nontender with normal range of motion in all extremities. No cyanosis, or erythema of extremities. Neurologic:  Face is symmetric. Moving all extremities. No gross focal neurologic deficits . Skin: Skin is warm, dry.  No rash or ulcers Psychiatric: Mood and affect are normal    Labs on Admission: I have personally reviewed following labs and imaging studies  CBC: Recent Labs  Lab 01/15/21 2312  WBC  11.5*  NEUTROABS 9.0*  HGB 12.7  HCT 40.5  MCV 87.3  PLT 734   Basic Metabolic Panel: Recent Labs  Lab 01/15/21 2312  NA 139  K 3.9  CL 106  CO2 17*  GLUCOSE 189*  BUN 55*  CREATININE 1.86*  CALCIUM 9.9   GFR: Estimated Creatinine Clearance: 13.6 mL/min (A) (by C-G formula based on SCr of 1.86 mg/dL (H)). Liver Function Tests: No results for input(s): AST, ALT, ALKPHOS, BILITOT, PROT, ALBUMIN in the last 168 hours. No results for input(s): LIPASE, AMYLASE in the last 168 hours. No results for input(s): AMMONIA in the last 168 hours. Coagulation Profile: No results for input(s): INR, PROTIME in the last 168 hours. Cardiac Enzymes: No results for input(s): CKTOTAL, CKMB, CKMBINDEX, TROPONINI in the last 168 hours. BNP (last 3 results) No results for input(s): PROBNP in the last 8760 hours. HbA1C: No results for input(s): HGBA1C in the last 72 hours. CBG: No results for input(s): GLUCAP in the last 168 hours. Lipid Profile: No results for input(s): CHOL, HDL, LDLCALC, TRIG, CHOLHDL, LDLDIRECT in the last 72 hours. Thyroid Function Tests: No results for input(s): TSH, T4TOTAL, FREET4, T3FREE, THYROIDAB in the last 72 hours. Anemia Panel: No results for input(s): VITAMINB12, FOLATE, FERRITIN, TIBC, IRON, RETICCTPCT in the last 72 hours. Urine analysis:    Component Value  Date/Time   COLORURINE YELLOW (A) 01/16/2021 0000   APPEARANCEUR CLOUDY (A) 01/16/2021 0000   LABSPEC 1.013 01/16/2021 0000   PHURINE 5.0 01/16/2021 0000   GLUCOSEU NEGATIVE 01/16/2021 0000   HGBUR MODERATE (A) 01/16/2021 0000   BILIRUBINUR NEGATIVE 01/16/2021 0000   BILIRUBINUR negaitve 05/29/2020 0932   KETONESUR 5 (A) 01/16/2021 0000   PROTEINUR 100 (A) 01/16/2021 0000   UROBILINOGEN 0.2 05/29/2020 0932   NITRITE NEGATIVE 01/16/2021 0000   LEUKOCYTESUR LARGE (A) 01/16/2021 0000    Radiological Exams on Admission: No results found.   Assessment/Plan 80 year old female with history of dementia on in-home hospice but still a full code, CKD  IIIb, arthritis presented with a syncopal episode in which she slumped forward in her wheelchair     Syncope -Patient with brief episode of loss of consciousness with return to baseline mentation -Suspect related to UTI and dehydration.  Troponins were elevated but downtrending -Son wants full spectrum of care -Continuous cardiac monitoring and echocardiogram and follow-up chest x-ray    Elevated troponin -Troponin downtrending.  212/194, suspecting demand ischemia -No evidence of discomfort to suggest chest pain and EKG is nonacute -Can consider cardiology consult    UTI (urinary tract infection) -IV Rocephin -Follow cultures    Arthritis, degenerative -Pain control -Assistance with transfers    Dementia without behavioral disturbance ([place]) -Patient on in-home hospice    Underweight -Patient appears emaciated but could be her baseline -Nutritionist consult    Stage 3b chronic kidney disease ([place]) -Creatinine 1.86 which appears to be baseline but BUN elevated at 55 -Hydrate and monitor    DVT prophylaxis: Lovenox  Code Status: full code  Family Communication:  none  Disposition Plan: Back to previous home environment Consults called: none  Status: Observation    Athena Masse MD [facility]     01/16/2021, 3:23 AM

## 2021-01-16 NOTE — Progress Notes (Signed)
PROGRESS NOTE    Sarah [name]  OVZ:[id number] DOB: Jun 15, 1941 DOA: 01/15/2021 PCP: Jerrol Banana., MD   Assessment & Plan:   Principal Problem:   Syncope Active Problems:   Arthritis, degenerative   Dementia ([place])   Underweight   Hospice care   Elevated troponin   UTI (urinary tract infection)   Stage 3b chronic kidney disease (HCC)   Syncope: etiology unclear, possibly secondary to UTI. Continue on tele. CXR shows no active disease. Echo was ordered.   Elevated troponin: likely secondary to demand ischemia  UTI: continue on IV rocephin. Urine cx is pending   Arthritis: etiology unclear, RA vs OA.   Dementia: continue w/ hospice care   Underweight: BMI 19.0. Continue w/ hospice care   CKDIIIb: Cr is trending down from day prior. Avoid nephrotoxic meds     DVT prophylaxis: lovenox  Code Status: full  Family Communication: called pt's son, Cassandria Santee but no answer Disposition Plan: depends on PT/OT recs   Level of care: Progressive Cardiac   Status is: Inpatient  Remains inpatient appropriate because:Unsafe d/c plan, IV treatments appropriate due to intensity of illness or inability to take PO and Inpatient level of care appropriate due to severity of illness   Dispo: The patient is from: Home              Anticipated d/c is to: Home              Patient currently is not medically stable to d/c.   Difficult to place patient No     Consultants:  Hospice    Procedures:    Antimicrobials:    Subjective: Pt is oriented to person only. Pt did not verbalize any complaints  Objective: Vitals:   01/15/21 2313 01/16/21 0300 01/16/21 0500 01/16/21 0630  BP:  (!) 169/93 (!) 131/101 135/90  Pulse:   81 72  Resp:  (!) 22 17 (!) 21  Temp:      TempSrc:      SpO2:  98% 100% 98%  Weight: 38.6 kg     Height: 4\' 8"  (1.422 m)      No intake or output data in the 24 hours ending 01/16/21 0834 Filed Weights   01/15/21 2313  Weight: 38.6 kg     Examination:  General exam: Appears calm and comfortable  Respiratory system: Clear to auscultation. Respiratory effort normal. Cardiovascular system: S1 & S2+. No rubs, gallops or clicks.  Gastrointestinal system: Abdomen is nondistended, soft and nontender. Normal bowel sounds heard. Central nervous system: Alert and awake. Moves all extremities  Psychiatry: Judgement and insight appear abnormal. Flat mood and affect     Data Reviewed: I have personally reviewed following labs and imaging studies  CBC: Recent Labs  Lab 01/15/21 2312  WBC 11.5*  NEUTROABS 9.0*  HGB 12.7  HCT 40.5  MCV 87.3  PLT 412   Basic Metabolic Panel: Recent Labs  Lab 01/15/21 2312  NA 139  K 3.9  CL 106  CO2 17*  GLUCOSE 189*  BUN 55*  CREATININE 1.86*  CALCIUM 9.9   GFR: Estimated Creatinine Clearance: 13.6 mL/min (A) (by C-G formula based on SCr of 1.86 mg/dL (H)). Liver Function Tests: No results for input(s): AST, ALT, ALKPHOS, BILITOT, PROT, ALBUMIN in the last 168 hours. No results for input(s): LIPASE, AMYLASE in the last 168 hours. No results for input(s): AMMONIA in the last 168 hours. Coagulation Profile: No results for input(s): INR, PROTIME in the last  168 hours. Cardiac Enzymes: No results for input(s): CKTOTAL, CKMB, CKMBINDEX, TROPONINI in the last 168 hours. BNP (last 3 results) No results for input(s): PROBNP in the last 8760 hours. HbA1C: No results for input(s): HGBA1C in the last 72 hours. CBG: Recent Labs  Lab 01/16/21 0535  GLUCAP 121*   Lipid Profile: No results for input(s): CHOL, HDL, LDLCALC, TRIG, CHOLHDL, LDLDIRECT in the last 72 hours. Thyroid Function Tests: No results for input(s): TSH, T4TOTAL, FREET4, T3FREE, THYROIDAB in the last 72 hours. Anemia Panel: No results for input(s): VITAMINB12, FOLATE, FERRITIN, TIBC, IRON, RETICCTPCT in the last 72 hours. Sepsis Labs: No results for input(s): PROCALCITON, LATICACIDVEN in the last 168  hours.  No results found for this or any previous visit (from the past 240 hour(s)).       Radiology Studies: Portable Chest 1 View  Result Date: 01/16/2021 CLINICAL DATA:  80 year old female with unresponsiveness. EXAM: PORTABLE CHEST 1 VIEW COMPARISON:  Chest radiograph dated 10/04/2014 CT dated 04/07/2017. FINDINGS: Background of emphysema. No focal consolidation, pleural effusion or pneumothorax. The cardiac silhouette is within limits. Atherosclerotic calcification of the aorta. No acute osseous pathology. IMPRESSION: No active disease. Electronically Signed   By: Anner Crete M.D.   On: 01/16/2021 03:37        Scheduled Meds: . enoxaparin (LOVENOX) injection  30 mg Subcutaneous Q24H  . sodium chloride flush  3 mL Intravenous Q12H   Continuous Infusions: . sodium chloride 75 mL/hr at 01/16/21 0539  . cefTRIAXone (ROCEPHIN)  IV       LOS: 0 days    Time spent: 30 mins    Wyvonnia Dusky, MD [facility] Pager [phone]  If 7PM-7AM, please contact night-coverage 01/16/2021, 8:34 AM

## 2021-01-16 NOTE — Plan of Care (Signed)
  Problem: Safety: Goal: Ability to remain free from injury will improve Outcome: Progressing   Problem: Skin Integrity: Goal: Risk for impaired skin integrity will decrease Outcome: Progressing   

## 2021-01-16 NOTE — Telephone Encounter (Signed)
Copied from [place] [phone]. Topic: General - Other >> Jan 16, 2021  2:04 PM Pawlus, Brayton Layman A wrote: Reason for CRM: Pts grandson called in regarding the Pt and really wanted to discuss some details of her care with either Dr Rosanna Randy of his nurse. Pt was recently hospitalized.

## 2021-01-16 NOTE — TOC Progression Note (Signed)
Transition of Care [facility]) - Progression Note    Patient Details  Name: Sarah [name] MRN: [id number] Date of Birth: October 27, 1941  Transition of Care [facility] CM/SW [place], [place] Phone Number: [phone] 01/16/2021, 2:35 PM  Clinical Narrative:     Patient active with [place].  Kieth Brightly RN has contacted patient.       Expected Discharge Plan and Services                                                 Social Determinants of Health (SDOH) Interventions    Readmission Risk Interventions No flowsheet data found.

## 2021-01-16 NOTE — ED Notes (Signed)
Report given to [place], RN

## 2021-01-17 DIAGNOSIS — N390 Urinary tract infection, site not specified: Principal | ICD-10-CM

## 2021-01-17 LAB — CBC
HCT: 32.7 % — ABNORMAL LOW (ref 36.0–46.0)
Hemoglobin: 10.4 g/dL — ABNORMAL LOW (ref 12.0–15.0)
MCH: 27.5 pg (ref 26.0–34.0)
MCHC: 31.8 g/dL (ref 30.0–36.0)
MCV: 86.5 fL (ref 80.0–100.0)
Platelets: 189 10*3/uL (ref 150–400)
RBC: 3.78 MIL/uL — ABNORMAL LOW (ref 3.87–5.11)
RDW: 14.9 % (ref 11.5–15.5)
WBC: 8.2 10*3/uL (ref 4.0–10.5)
nRBC: 0 % (ref 0.0–0.2)

## 2021-01-17 LAB — BASIC METABOLIC PANEL
Anion gap: 13 (ref 5–15)
BUN: 50 mg/dL — ABNORMAL HIGH (ref 8–23)
CO2: 16 mmol/L — ABNORMAL LOW (ref 22–32)
Calcium: 8.6 mg/dL — ABNORMAL LOW (ref 8.9–10.3)
Chloride: 115 mmol/L — ABNORMAL HIGH (ref 98–111)
Creatinine, Ser: 1.83 mg/dL — ABNORMAL HIGH (ref 0.44–1.00)
GFR, Estimated: 27 mL/min — ABNORMAL LOW (ref >60)
Glucose, Bld: 74 mg/dL (ref 70–99)
Potassium: 3.5 mmol/L (ref 3.5–5.1)
Sodium: 144 mmol/L (ref 135–145)

## 2021-01-17 LAB — GLUCOSE, CAPILLARY
Glucose-Capillary: 58 mg/dL — ABNORMAL LOW (ref 70–99)
Glucose-Capillary: 241 mg/dL — ABNORMAL HIGH (ref 70–99)

## 2021-01-17 LAB — ECHOCARDIOGRAM COMPLETE
Area-P 1/2: 2.56 cm2
Height: 56 in
S' Lateral: 2.17 cm
Weight: 1308.65 oz

## 2021-01-17 MED ORDER — DEXTROSE 10 % IV SOLN: INTRAVENOUS | Status: DC

## 2021-01-17 MED ORDER — ADULT MULTIVITAMIN W/MINERALS CH
1.0000 | ORAL_TABLET | Freq: Every day | ORAL | Status: DC
  Administered 2021-01-18 – 2021-01-19 (×2): 1 via ORAL
  Filled 2021-01-17 (×2): qty 1

## 2021-01-17 MED ORDER — HEPARIN SODIUM (PORCINE) 5000 UNIT/ML IJ SOLN
5000.0000 [IU] | Freq: Three times a day (TID) | INTRAMUSCULAR | Status: DC
  Administered 2021-01-18 – 2021-01-19 (×4): 5000 [IU] via SUBCUTANEOUS
  Filled 2021-01-17 (×4): qty 1

## 2021-01-17 MED ORDER — DEXTROSE 50 % IV SOLN
1.0000 | Freq: Once | INTRAVENOUS | Status: AC
  Administered 2021-01-17: 50 mL via INTRAVENOUS

## 2021-01-17 MED ORDER — ENSURE ENLIVE PO LIQD
237.0000 mL | Freq: Two times a day (BID) | ORAL | Status: DC
  Administered 2021-01-18 – 2021-01-19 (×3): 237 mL via ORAL

## 2021-01-17 NOTE — Progress Notes (Signed)
Initial Nutrition Assessment  DOCUMENTATION CODES:   Severe malnutrition in context of social or environmental circumstances  INTERVENTION:   Ensure Enlive po BID, each supplement provides 350 kcal and 20 grams of protein  Magic cup TID with meals, each supplement provides 290 kcal and 9 grams of protein  MVI po daily   Pt at high refeed risk; recommend monitor potassium, magnesium and phosphorus labs daily until stable  NUTRITION DIAGNOSIS:   Severe Malnutrition related to social / environmental circumstances (dementia, advanced age) as evidenced by severe fat depletion,severe muscle depletion.  GOAL:   Patient will meet greater than or equal to 90% of their needs  MONITOR:   PO intake,Supplement acceptance,Labs,Weight trends,Skin,I & O's  REASON FOR ASSESSMENT:   Malnutrition Screening Tool    ASSESSMENT:   80 y/o female with h/o dementia, HTN, HLD, CKD and followed by home hospice who presents with syncope and UTI  Met with pt and family in room today. Pt with dementia and is unable to provide any history so history obtained from pt's family at bedside. Family reports pt with good appetite and oral intake at baseline but reports that over the past 2 weeks pt has had poor oral intake and was only eating about every other day. Family reports that pt does drink vanilla Ensure at home and that she has been drinking this for a long time. Pt ate 100% of her lunch tray today. RD will add supplements and MVI to help pt meet her estimated needs. Per chart, pt is currently down 5lbs(6%) from her UBW. Family reports that pt has always been small but she used to weigh ~100lbs many years ago.   Medications reviewed and include: heparin, NaCl @75ml /hr, ceftriaxone   Labs reviewed: K 3.5 wnl, BUN 50(H), creat 1.83(H) Hgb 10.4(L), Hct 32.7(L)  NUTRITION - FOCUSED PHYSICAL EXAM:  Flowsheet Row Most Recent Value  Orbital Region Moderate depletion  Upper Arm Region Severe depletion   Thoracic and Lumbar Region Severe depletion  Buccal Region Severe depletion  Temple Region Severe depletion  Clavicle Bone Region Severe depletion  Clavicle and Acromion Bone Region Severe depletion  Scapular Bone Region Severe depletion  Dorsal Hand Moderate depletion  Patellar Region Severe depletion  Anterior Thigh Region Severe depletion  Posterior Calf Region Severe depletion  Edema (RD Assessment) None  Hair Reviewed  Eyes Reviewed  Mouth Reviewed  Skin Reviewed  Nails Reviewed     Diet Order:   Diet Order            DIET DYS 3 Room service appropriate? No; Fluid consistency: Thin  Diet effective now                EDUCATION NEEDS:   No education needs have been identified at this time  Skin:  Skin Assessment: Reviewed RN Assessment  Last BM:  3/24- type 5  Height:   Ht Readings from Last 1 Encounters:  01/16/21 4' 8"  (1.422 m)    Weight:   Wt Readings from Last 1 Encounters:  01/17/21 40.8 kg    Ideal Body Weight:  42 kg  BMI:  Body mass index is 20.17 kg/m.  Estimated Nutritional Needs:   Kcal:  1200-1400kcal/day  Protein:  60-70g/day  Fluid:  1.0-1.2L/day  Koleen Distance MS, RD, LDN Please refer to [facility] for RD and/or RD on-call/weekend/after hours pager

## 2021-01-17 NOTE — Progress Notes (Signed)
PT Cancellation Note  Patient Details Name: Sarah [name] MRN: [id number] DOB: 13-Sep-1941   Cancelled Treatment:    Reason Eval/Treat Not Completed: Other (comment);PT screened, no needs identified, will sign off. PT spoke with pt family as well as OT. For the last month or so pt has been nonambulatory, and family has been assisting with transfers to wheelchair as well as all ADLs, followed by hospice. Family politely declined PT services at this time, but may be open to a hoyer lift at home to increase pt/family safety. CSW notified. Please re-consult therapy services if acute needs are identified.   Lieutenant Diego PT, DPT 1:47 PM,01/17/21

## 2021-01-17 NOTE — Progress Notes (Signed)
[place] [facility] [place] ([place] patient RN note:  Sarah [name] is a current hospice patient with a terminal diagnosis of Alzheimers. She was brought to the emergency room on 03.22 after an episode at home in which she slumped forward in her wheelchair and was drooling  The episode lasted just a few minutes and she started coming around. Her son contacted 911 and notified [facility] that patient was being transported to [facility]. She was previously in her usual state of health. Her blood work was significant for WBC 11,000 and  Troponin was 212/194.  Urinalysis with few bacteria and large leukocyte esterase. She was admitted to [facility] at 08:35 am on 03.23 with a diagnosis or Syncope and elevated Troponin. Per Dr. Gilford Rile with [facility], this is a related admission. She is a Full code.  Visited with patient and grandson, Sarah [name] at bedside. Patient was resting comfortably in bed. She was alert and stated that she felt fine. Report exchanged with hospital care team. Per Sarah [name], plan is to continue IV fluids and IV antibiotics while waiting on results of urine culture, then discharge back to home.  Vital Signs: BP 161/83; HR 52; Resp 18; Temp 98 ax; O2 sat 99% on RA  I&O: 1459ml/none charted  Abnormal labs: Chloride: 115 (H) CO2: 16 (L) BUN: 50 (H) Creatinine: 1.83 (H) Calcium: 8.6 (L) GFR, Estimated: 27 (L) RBC: 3.78 (L) Hemoglobin: 10.4 (L) HCT: 32.7 (L) Urine culture + for Ecoli  Diagnostics: none knew  IV/PRN Meds: 0.9 % sodium chloride infusion Rate: 75 mL/hr Freq: Continuous Route: IV Start: 01/16/21 0330 End: 01/26/21 0329  cefTRIAXone (ROCEPHIN) 1 g in sodium chloride 0.9 % 100 mL IVPB Dose: 1 g Freq: Every 24 hours Route: IV Last Dose: Stopped (01/17/21 1036) Start: 01/16/21 2300  dextrose 10 % infusion Rate: 30 mL/hr Freq: Continuous Route: IV Last Dose: Stopped (01/17/21 0525) Start: 01/17/21 0600  Problem  List: Principal Problem:   Syncope Active Problems:   Arthritis, degenerative   Dementia (HCC)   Underweight   Hospice care   Elevated troponin   UTI (urinary tract infection)   Stage 3b chronic kidney disease (HCC)   Syncope: etiology unclear, possibly secondary to UTI. Continue on tele. CXR shows no active disease. Echo was ordered.   Elevated troponin: likely secondary to demand ischemia  UTI: continue on IV rocephin. Urine cx is pending   Arthritis: etiology unclear, RA vs OA.   Dementia: continue w/ hospice care   Underweight: BMI 19.0. Continue w/ hospice care   CKDIIIb: Cr is trending down from day prior. Avoid nephrotoxic meds   Discharge Planning: Plan is to discharge home once IV antibiotics have completed.  Family Contact: Spoke with grandson, Sarah [name] in the room. No concerns.  IDG: Updated  Goals of Care: clear patient remains a full code.  Patient remains GIP on IV Rocephin and IV Fluids, pending results from urine culture.  Please call with any hospice related questions or concerns.  Sarah Abts, RN [facility] Liaison [phone]

## 2021-01-17 NOTE — Progress Notes (Signed)
OT Cancellation Note  Patient Details Name: DEYANI HEGARTY MRN: [id number] DOB: 07/22/1941   Cancelled Treatment:    Reason Eval/Treat Not Completed: OT screened, no needs identified, will sign off. Orders received and chart reviewed. Upon arrival, pt's grandson at bedside reporting pt baseline requires total assist for ADLs and t/fs. Family has hospital bed, w/c, and HH aid 3x/week. Family educated on therapy services and hoyer lift - family denies need at this time. Pt has advanced dementia and currently on hospice, pt's grandson reports family wants focus on comfort. Pt appears agitated with therapist attempt at engaging in repositioning - grandson assisted. Will sign off at this time. Please re-consult if pt/family desires for services change. Thank you.   Dessie Coma, M.S. OTR/L  01/17/21, 12:49 PM  ascom [phone]

## 2021-01-17 NOTE — Progress Notes (Signed)
Patient is refusing telemetry, continues to remove leads

## 2021-01-17 NOTE — Progress Notes (Signed)
PROGRESS NOTE    Sarah [name]  EYC:[id number] DOB: [birth year] DOA: 01/15/2021 PCP: Jerrol Banana., MD   Assessment & Plan:   Principal Problem:   Syncope Active Problems:   Arthritis, degenerative   Dementia ([place])   Underweight   Hospice care   Elevated troponin   UTI (urinary tract infection)   Stage 3b chronic kidney disease (HCC)   Syncope: etiology unclear, possibly secondary to UTI. Continue on tele. CXR shows no active disease. Echo shows EF 14-97%, grade I diastolic dysfunction, severe LV hypertrophy, no wall motion abnormality and no atrial level shunts detected.   Elevated troponin: likely secondary to demand ischemia   UTI: continue on IV rocephin. Urine cx is growing e. coli, sens pending   Arthritis: etiology unclear, OA vs RA. Continue w/ supportive care  Dementia: continue w/ hospice care   Underweight: BMI 19.0. Continue w/ hospice care   CKDIIIb: Cr is trending down again today. Avoid nephrotoxic meds     DVT prophylaxis: lovenox  Code Status: full  Family Communication: discussed pt's care w/ pt's grandson, [name] & answered his questions  Disposition Plan: likely back home w/ family care  Level of care: Progressive Cardiac   Status is: Inpatient  Remains inpatient appropriate because:Unsafe d/c plan, IV treatments appropriate due to intensity of illness or inability to take PO and Inpatient level of care appropriate due to severity of illness   Dispo: The patient is from: Home              Anticipated d/c is to: Home              Patient currently is not medically stable to d/c.   Difficult to place patient No     Consultants:  Hospice    Procedures:    Antimicrobials:    Subjective: Pt denies any complaints. Pt is oriented to self only   Objective: Vitals:   01/17/21 0441 01/17/21 0500 01/17/21 0537 01/17/21 0752  BP: (!) 177/77  (!) 175/63 (!) 161/83  Pulse: 61  (!) 47 (!) 52  Resp: 16   18  Temp: (!) 97.5 F  (36.4 C)   98 F (36.7 C)  TempSrc: Oral   Axillary  SpO2: 99%   99%  Weight:  38.5 kg    Height:        Intake/Output Summary (Last 24 hours) at 01/17/2021 0810 Last data filed at 01/17/2021 0657 Gross per 24 hour  Intake 1457.05 ml  Output -  Net 1457.05 ml   Filed Weights   01/15/21 2313 01/16/21 1512 01/17/21 0500  Weight: 38.6 kg 37.1 kg 38.5 kg    Examination:  General exam: Appears comfortable  Respiratory system: clear breath sounds b/l  Cardiovascular system: S1/S2+. No rubs or clicks  Gastrointestinal system: Abd is soft, NT, ND & hypoactive bowel sounds  Central nervous system: Alert and awake. Moves all extremities   Psychiatry: Judgement and insight appear abnormal. Flat mood and affect     Data Reviewed: I have personally reviewed following labs and imaging studies  CBC: Recent Labs  Lab 01/15/21 2312 01/17/21 0452  WBC 11.5* 8.2  NEUTROABS 9.0*  --   HGB 12.7 10.4*  HCT 40.5 32.7*  MCV 87.3 86.5  PLT 289 026   Basic Metabolic Panel: Recent Labs  Lab 01/15/21 2312 01/17/21 0452  NA 139 144  K 3.9 3.5  CL 106 115*  CO2 17* 16*  GLUCOSE 189* 74  BUN 55*  50*  CREATININE 1.86* 1.83*  CALCIUM 9.9 8.6*   GFR: Estimated Creatinine Clearance: 13.8 mL/min (A) (by C-G formula based on SCr of 1.83 mg/dL (H)). Liver Function Tests: No results for input(s): AST, ALT, ALKPHOS, BILITOT, PROT, ALBUMIN in the last 168 hours. No results for input(s): LIPASE, AMYLASE in the last 168 hours. No results for input(s): AMMONIA in the last 168 hours. Coagulation Profile: No results for input(s): INR, PROTIME in the last 168 hours. Cardiac Enzymes: No results for input(s): CKTOTAL, CKMB, CKMBINDEX, TROPONINI in the last 168 hours. BNP (last 3 results) No results for input(s): PROBNP in the last 8760 hours. HbA1C: No results for input(s): HGBA1C in the last 72 hours. CBG: Recent Labs  Lab 01/16/21 0535 01/17/21 0443 01/17/21 0534  GLUCAP 121* 58* 241*    Lipid Profile: No results for input(s): CHOL, HDL, LDLCALC, TRIG, CHOLHDL, LDLDIRECT in the last 72 hours. Thyroid Function Tests: No results for input(s): TSH, T4TOTAL, FREET4, T3FREE, THYROIDAB in the last 72 hours. Anemia Panel: No results for input(s): VITAMINB12, FOLATE, FERRITIN, TIBC, IRON, RETICCTPCT in the last 72 hours. Sepsis Labs: No results for input(s): PROCALCITON, LATICACIDVEN in the last 168 hours.  Recent Results (from the past 240 hour(s))  SARS CORONAVIRUS 2 (TAT 6-24 HRS) Nasopharyngeal Nasopharyngeal Swab     Status: None   Collection Time: 01/16/21  3:49 AM   Specimen: Nasopharyngeal Swab  Result Value Ref Range Status   SARS Coronavirus 2 NEGATIVE NEGATIVE Final    Comment: (NOTE) SARS-CoV-2 target nucleic acids are NOT DETECTED.  The SARS-CoV-2 RNA is generally detectable in upper and lower respiratory specimens during the acute phase of infection. Negative results do not preclude SARS-CoV-2 infection, do not rule out co-infections with other pathogens, and should not be used as the sole basis for treatment or other patient management decisions. Negative results must be combined with clinical observations, patient history, and epidemiological information. The expected result is Negative.  Fact Sheet for Patients: SugarRoll.be  Fact Sheet for Healthcare Providers: https://www.woods-mathews.com/  This test is not yet approved or cleared by the [place] FDA and  has been authorized for detection and/or diagnosis of SARS-CoV-2 by FDA under an Emergency Use Authorization (EUA). This EUA will remain  in effect (meaning this test can be used) for the duration of the COVID-19 declaration under Se ction 564(b)(1) of the Act, 21 U.S.C. section 360bbb-3(b)(1), unless the authorization is terminated or revoked sooner.  Performed at [facility], [place] [address], [place], [place] [address]           Radiology Studies: Portable Chest 1 View  Result Date: 01/16/2021 CLINICAL DATA:  80 year old female with unresponsiveness. EXAM: PORTABLE CHEST 1 VIEW COMPARISON:  Chest radiograph dated 10/04/2014 CT dated 04/07/2017. FINDINGS: Background of emphysema. No focal consolidation, pleural effusion or pneumothorax. The cardiac silhouette is within limits. Atherosclerotic calcification of the aorta. No acute osseous pathology. IMPRESSION: No active disease. Electronically Signed   By: Anner Crete M.D.   On: 01/16/2021 03:37        Scheduled Meds: . enoxaparin (LOVENOX) injection  30 mg Subcutaneous Q24H  . sodium chloride flush  3 mL Intravenous Q12H   Continuous Infusions: . sodium chloride 75 mL/hr at 01/17/21 0548  . cefTRIAXone (ROCEPHIN)  IV 1 g (01/17/21 0010)  . dextrose Stopped (01/17/21 0525)     LOS: 1 day    Time spent: 35 mins    Wyvonnia Dusky, MD [facility] Pager [phone]  If 7PM-7AM, please contact night-coverage 01/17/2021, 8:10 AM

## 2021-01-18 DIAGNOSIS — E43 Unspecified severe protein-calorie malnutrition: Secondary | ICD-10-CM | POA: Insufficient documentation

## 2021-01-18 LAB — CBC
HCT: 31.6 % — ABNORMAL LOW (ref 36.0–46.0)
Hemoglobin: 10.1 g/dL — ABNORMAL LOW (ref 12.0–15.0)
MCH: 27.4 pg (ref 26.0–34.0)
MCHC: 32 g/dL (ref 30.0–36.0)
MCV: 85.6 fL (ref 80.0–100.0)
Platelets: 215 10*3/uL (ref 150–400)
RBC: 3.69 MIL/uL — ABNORMAL LOW (ref 3.87–5.11)
RDW: 15.1 % (ref 11.5–15.5)
WBC: 10 10*3/uL (ref 4.0–10.5)
nRBC: 0 % (ref 0.0–0.2)

## 2021-01-18 LAB — BASIC METABOLIC PANEL
Anion gap: 7 (ref 5–15)
BUN: 45 mg/dL — ABNORMAL HIGH (ref 8–23)
CO2: 20 mmol/L — ABNORMAL LOW (ref 22–32)
Calcium: 8.8 mg/dL — ABNORMAL LOW (ref 8.9–10.3)
Chloride: 112 mmol/L — ABNORMAL HIGH (ref 98–111)
Creatinine, Ser: 1.8 mg/dL — ABNORMAL HIGH (ref 0.44–1.00)
GFR, Estimated: 28 mL/min — ABNORMAL LOW (ref >60)
Glucose, Bld: 125 mg/dL — ABNORMAL HIGH (ref 70–99)
Potassium: 3.2 mmol/L — ABNORMAL LOW (ref 3.5–5.1)
Sodium: 139 mmol/L (ref 135–145)

## 2021-01-18 LAB — URINE CULTURE: Culture: >=100000 — AB

## 2021-01-18 LAB — GLUCOSE, CAPILLARY: Glucose-Capillary: 127 mg/dL — ABNORMAL HIGH (ref 70–99)

## 2021-01-18 MED ORDER — LISINOPRIL 20 MG PO TABS
20.0000 mg | ORAL_TABLET | Freq: Every day | ORAL | Status: DC
  Administered 2021-01-18 – 2021-01-19 (×2): 20 mg via ORAL
  Filled 2021-01-18 (×2): qty 1

## 2021-01-18 MED ORDER — HYDRALAZINE HCL 20 MG/ML IJ SOLN
10.0000 mg | Freq: Four times a day (QID) | INTRAMUSCULAR | Status: DC | PRN
  Administered 2021-01-18: 10 mg via INTRAVENOUS
  Filled 2021-01-18: qty 1

## 2021-01-18 MED ORDER — SODIUM CHLORIDE 0.9 % IV SOLN
1.0000 g | Freq: Two times a day (BID) | INTRAVENOUS | Status: DC
  Administered 2021-01-19: 1 g via INTRAVENOUS
  Filled 2021-01-18: qty 1
  Filled 2021-01-18: qty 10

## 2021-01-18 MED ORDER — POTASSIUM CHLORIDE 20 MEQ PO PACK
20.0000 meq | PACK | Freq: Once | ORAL | Status: AC
  Administered 2021-01-18: 20 meq via ORAL
  Filled 2021-01-18: qty 1

## 2021-01-18 NOTE — Progress Notes (Signed)
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 She was brought to the emergency room on 03.22 at request of her family after an episode at home in which she slumped forward in her wheelchair and was drooling  The episode lasted just a few minutes and she started coming around. Her son contacted 911 and notified [facility] that patient was being transported to [facility]. She was previously in her usual state of health. Her blood work was significant for WBC 11,000 and  Troponin was 212/194.  Urinalysis with few bacteria and large leukocyte esterase. She was admitted to [facility] at 08:35 am on 03.23 with a diagnosis or Syncope and elevated Troponin. Per Dr. Gilford Rile with [facility], this is a related admission. She is a Full code.  Liaison visited with patient at bedside. Her grandson Sarah [name] was present. Patient was alert and quietly interactive. She denied any discomfort. Mitts in place due to some agitation overnight, trying to remove telemetry leads. Sarah [name] reports that Mrs. Sarah [name] ate well this morning and was able to take her medications. He feels she is comfortable. She did receive a dose of PRN IV Apresoline 10 mg for elevated BP over night. She remains on IV fluids and IV antibiotics, with plan for transition to oral at discharge.  Vital signs: BP 139/77, HR 79,Resp  16 , 99% on RA, 98.4 oral  I&O:  Abnormal Labs: Potassium: 3.2 (L) Chloride: 112 (H) CO2: 20 (L) Glucose: 125 (H) BUN: 45 (H) Creatinine: 1.80 (H) Calcium: 8.8 (L) GFR, Estimated: 28 (L) WBC: 10.0 RBC: 3.69 (L) Hemoglobin: 10.1 (L) HCT: 31.6 (L) Urine culture + for Ecoli, abt to change to Ancef IV on 3/26  IV/PRN Meds: Cefazolin (ANCEF) 1gm in sodium chloride 0.9% 100 ml IVPB Q 12 hrs IV start 3/26 0929  0.9 % sodium chloride infusion Rate: 75 mL/hr Freq: Continuous Route: IV Start: 01/16/21 0330 End: 01/26/21  0329  Diagnostics: none new   Principal Problem:   Syncope Active Problems:   Arthritis, degenerative   Dementia (HCC)   Underweight   Hospice care   Elevated troponin   UTI (urinary tract infection)   Stage 3b chronic kidney disease (HCC)   Protein-calorie malnutrition, severe  Syncope: etiology unclear, possibly secondary to UTI. Continue on tele. CXR shows no active disease. Echo shows EF 76-54%, grade I diastolic dysfunction, severe LV hypertrophy, no wall motion abnormality and no atrial level shunts detected.   Elevated troponin: likely secondary to demand ischemia   UTI: urine cx growing e. coli. Switched to IV cefazolin to narrow abx   Hypokalemia: KCl given. Will continue to monitor   Arthritis: etiology unclear, OA vs RA. Continue w/ supportive care  Dementia: continue w/ hospice care   Underweight: BMI 19.0. Continue w/ hospice care   CKDIIIb: Cr continues to trend down daily   Severe protein calorie malnutrition: continue w/ nutritional supplements   Discharge Planning: Plan is to discharge home once IV antibiotics have completed.  Family Contact: Spoke with grandson, Sarah [name] at bedside. No concerns.  IDG: Updated  Goals of Care: clear patient remains a full code.  Patient remains GIP on IV Ancef and IV Fluids.  Liaison to follow thorough discharge.  Flo Shanks BSN, RN, [facility] [employer] [phone]

## 2021-01-18 NOTE — Progress Notes (Signed)
PROGRESS NOTE    Sarah [name]  MMH:[id number] DOB: 13-Dec-1940 DOA: 01/15/2021 PCP: Jerrol Banana., MD   Assessment & Plan:   Principal Problem:   Syncope Active Problems:   Arthritis, degenerative   Dementia ([place])   Underweight   Hospice care   Elevated troponin   UTI (urinary tract infection)   Stage 3b chronic kidney disease (HCC)   Protein-calorie malnutrition, severe   Syncope: etiology unclear, possibly secondary to UTI. Continue on tele. CXR shows no active disease. Echo shows EF 15-94%, grade I diastolic dysfunction, severe LV hypertrophy, no wall motion abnormality and no atrial level shunts detected.   Elevated troponin: likely secondary to demand ischemia   UTI: urine cx growing e. coli. Switched to IV cefazolin to narrow abx   Hypokalemia: KCl given. Will continue to monitor   Arthritis: etiology unclear, OA vs RA. Continue w/ supportive care  Dementia: continue w/ hospice care   Underweight: BMI 19.0. Continue w/ hospice care   CKDIIIb: Cr continues to trend down daily   Severe protein calorie malnutrition: continue w/ nutritional supplements    DVT prophylaxis: lovenox  Code Status: full  Family Communication: discussed pt's care w/ pt's grandson, [name] & answered his questions  Disposition Plan: likely back home w/ family care  Level of care: Progressive Cardiac   Status is: Inpatient  Remains inpatient appropriate because:Unsafe d/c plan, IV treatments appropriate due to intensity of illness or inability to take PO and Inpatient level of care appropriate due to severity of illness   Dispo: The patient is from: Home              Anticipated d/c is to: Home              Patient currently is not medically stable to d/c.   Difficult to place patient No     Consultants:  Hospice    Procedures:    Antimicrobials:    Subjective: Pt is oriented to person only still.  Objective: Vitals:   01/18/21 0057 01/18/21 0157  01/18/21 0527 01/18/21 0729  BP: (!) 186/94 (!) 159/69 (!) 157/77 (!) 154/87  Pulse: 81  76 76  Resp: 16  17 16   Temp: 98.7 F (37.1 C)  98 F (36.7 C) 98 F (36.7 C)  TempSrc: Oral  Oral Oral  SpO2: 97%  98% 100%  Weight:    39.6 kg  Height:        Intake/Output Summary (Last 24 hours) at 01/18/2021 0804 Last data filed at 01/17/2021 1930 Gross per 24 hour  Intake 1164.84 ml  Output --  Net 1164.84 ml   Filed Weights   01/17/21 0500 01/17/21 1628 01/18/21 0729  Weight: 38.5 kg 40.8 kg 39.6 kg    Examination:  General exam: Appears calm & comfortable. Frail appearing  Respiratory system: clear breath sounds b/l  Cardiovascular system: S1 & S2+. No rubs or clicks  Gastrointestinal system: Abd is soft, NT, ND & normal bowel sounds  Central nervous system: Alert and awake. Moves all extremities Psychiatry: Judgement and insight appear abnormal. Flat mood and affect     Data Reviewed: I have personally reviewed following labs and imaging studies  CBC: Recent Labs  Lab 01/15/21 2312 01/17/21 0452 01/18/21 0506  WBC 11.5* 8.2 10.0  NEUTROABS 9.0*  --   --   HGB 12.7 10.4* 10.1*  HCT 40.5 32.7* 31.6*  MCV 87.3 86.5 85.6  PLT 289 189 585   Basic Metabolic Panel:  Recent Labs  Lab 01/15/21 2312 01/17/21 0452 01/18/21 0506  NA 139 144 139  K 3.9 3.5 3.2*  CL 106 115* 112*  CO2 17* 16* 20*  GLUCOSE 189* 74 125*  BUN 55* 50* 45*  CREATININE 1.86* 1.83* 1.80*  CALCIUM 9.9 8.6* 8.8*   GFR: Estimated Creatinine Clearance: 14 mL/min (A) (by C-G formula based on SCr of 1.8 mg/dL (H)). Liver Function Tests: No results for input(s): AST, ALT, ALKPHOS, BILITOT, PROT, ALBUMIN in the last 168 hours. No results for input(s): LIPASE, AMYLASE in the last 168 hours. No results for input(s): AMMONIA in the last 168 hours. Coagulation Profile: No results for input(s): INR, PROTIME in the last 168 hours. Cardiac Enzymes: No results for input(s): CKTOTAL, CKMB, CKMBINDEX,  TROPONINI in the last 168 hours. BNP (last 3 results) No results for input(s): PROBNP in the last 8760 hours. HbA1C: No results for input(s): HGBA1C in the last 72 hours. CBG: Recent Labs  Lab 01/16/21 0535 01/17/21 0443 01/17/21 0534 01/18/21 0530  GLUCAP 121* 58* 241* 127*   Lipid Profile: No results for input(s): CHOL, HDL, LDLCALC, TRIG, CHOLHDL, LDLDIRECT in the last 72 hours. Thyroid Function Tests: No results for input(s): TSH, T4TOTAL, FREET4, T3FREE, THYROIDAB in the last 72 hours. Anemia Panel: No results for input(s): VITAMINB12, FOLATE, FERRITIN, TIBC, IRON, RETICCTPCT in the last 72 hours. Sepsis Labs: No results for input(s): PROCALCITON, LATICACIDVEN in the last 168 hours.  Recent Results (from the past 240 hour(s))  Urine culture     Status: Abnormal (Preliminary result)   Collection Time: 01/16/21 12:00 AM   Specimen: Urine, Random  Result Value Ref Range Status   Specimen Description   Final    URINE, RANDOM Performed at [facility], [address], [place], [place] [address]    Special Requests   Final    NONE Performed at [facility], [address] [place], [place] [address]    Culture >=100,000 COLONIES/mL ESCHERICHIA COLI (A)  Final   Report Status PENDING  Incomplete  SARS CORONAVIRUS 2 (TAT 6-24 HRS) Nasopharyngeal Nasopharyngeal Swab     Status: None   Collection Time: 01/16/21  3:49 AM   Specimen: Nasopharyngeal Swab  Result Value Ref Range Status   SARS Coronavirus 2 NEGATIVE NEGATIVE Final    Comment: (NOTE) SARS-CoV-2 target nucleic acids are NOT DETECTED.  The SARS-CoV-2 RNA is generally detectable in upper and lower respiratory specimens during the acute phase of infection. Negative results do not preclude SARS-CoV-2 infection, do not rule out co-infections with other pathogens, and should not be used as the sole basis for treatment or other patient management decisions. Negative results must be combined with  clinical observations, patient history, and epidemiological information. The expected result is Negative.  Fact Sheet for Patients: SugarRoll.be  Fact Sheet for Healthcare Providers: https://www.woods-mathews.com/  This test is not yet approved or cleared by the [place] FDA and  has been authorized for detection and/or diagnosis of SARS-CoV-2 by FDA under an Emergency Use Authorization (EUA). This EUA will remain  in effect (meaning this test can be used) for the duration of the COVID-19 declaration under Se ction 564(b)(1) of the Act, 21 U.S.C. section 360bbb-3(b)(1), unless the authorization is terminated or revoked sooner.  Performed at [facility], [place] [address], [place], [place] [address]          Radiology Studies: ECHOCARDIOGRAM COMPLETE  Result Date: 01/17/2021    ECHOCARDIOGRAM REPORT   Patient Name:  Sarah [name] Date of Exam: 01/16/2021 Medical Rec #:  [id number]     Height:       56.0 in Accession #:    [id number]    Weight:       81.8 lb Date of Birth:  [birth year]     BSA:          1.214 m Patient Age:    80 years      BP:           136/73 mmHg Patient Gender: F             HR:           70 bpm. Exam Location:  [facility] Procedure: 2D Echo, Cardiac Doppler and Color Doppler Indications:     R55 Syncope  History:         Patient has no prior history of Echocardiogram examinations.                  Risk Factors:Hypertension and Dyslipidemia. Dementia.  Sonographer:     Wilford Sports Rodgers-Jones Referring Phys:  [id number] Athena Masse Diagnosing Phys: Ida Rogue MD IMPRESSIONS  1. Left ventricular ejection fraction, by estimation, is 60 to 65%. The left ventricle has normal function. The left ventricle has no regional wall motion abnormalities. There is severe concentric left ventricular hypertrophy. Left ventricular diastolic  parameters are consistent with Grade I diastolic dysfunction (impaired relaxation).  2. Right  ventricular systolic function is normal. The right ventricular size is normal. FINDINGS  Left Ventricle: Left ventricular ejection fraction, by estimation, is 60 to 65%. The left ventricle has normal function. The left ventricle has no regional wall motion abnormalities. The left ventricular internal cavity size was normal in size. There is  severe concentric left ventricular hypertrophy. Left ventricular diastolic parameters are consistent with Grade I diastolic dysfunction (impaired relaxation). Right Ventricle: The right ventricular size is normal. No increase in right ventricular wall thickness. Right ventricular systolic function is normal. Left Atrium: Left atrial size was normal in size. Right Atrium: Right atrial size was normal in size. Pericardium: There is no evidence of pericardial effusion. Mitral Valve: The mitral valve is normal in structure. No evidence of mitral valve regurgitation. No evidence of mitral valve stenosis. Tricuspid Valve: The tricuspid valve is normal in structure. Tricuspid valve regurgitation is not demonstrated. No evidence of tricuspid stenosis. Aortic Valve: The aortic valve is normal in structure. Aortic valve regurgitation is not visualized. Mild to moderate aortic valve sclerosis/calcification is present, without any evidence of aortic stenosis. Pulmonic Valve: The pulmonic valve was normal in structure. Pulmonic valve regurgitation is not visualized. No evidence of pulmonic stenosis. Aorta: The aortic root is normal in size and structure. Venous: The inferior vena cava is normal in size with greater than 50% respiratory variability, suggesting right atrial pressure of 3 mmHg. IAS/Shunts: No atrial level shunt detected by color flow Doppler.  LEFT VENTRICLE PLAX 2D LVIDd:         3.68 cm  Diastology LVIDs:         2.17 cm  LV e' medial:    3.15 cm/s LV PW:         1.19 cm  LV E/e' medial:  12.6 LV IVS:        1.19 cm  LV e' lateral:   3.05 cm/s LVOT diam:     2.10 cm  LV E/e'  lateral: 13.0 LV SV:         64 LV  SV Index:   53 LVOT Area:     3.46 cm  RIGHT VENTRICLE             IVC RV S prime:     18.20 cm/s  IVC diam: 0.77 cm TAPSE (M-mode): 1.1 cm LEFT ATRIUM             Index LA diam:        3.60 cm 2.97 cm/m LA Vol (A2C):   23.4 ml 19.28 ml/m LA Vol (A4C):   40.8 ml 33.61 ml/m LA Biplane Vol: 31.0 ml 25.54 ml/m  AORTIC VALVE LVOT Vmax:   101.00 cm/s LVOT Vmean:  61.900 cm/s LVOT VTI:    0.186 m  AORTA Ao Root diam: 3.40 cm MITRAL VALVE               TRICUSPID VALVE MV Area (PHT): 2.56 cm    TR Peak grad:   20.8 mmHg MV Decel Time: 296 msec    TR Vmax:        228.00 cm/s MV E velocity: 39.80 cm/s MV A velocity: 98.50 cm/s  SHUNTS MV E/A ratio:  0.40        Systemic VTI:  0.19 m                            Systemic Diam: 2.10 cm Ida Rogue MD Electronically signed by Ida Rogue MD Signature Date/Time: 01/17/2021/12:04:17 PM    Final         Scheduled Meds: . feeding supplement  237 mL Oral BID BM  . heparin injection (subcutaneous)  5,000 Units Subcutaneous Q8H  . lisinopril  20 mg Oral Daily  . multivitamin with minerals  1 tablet Oral Daily  . potassium chloride  20 mEq Oral Once  . sodium chloride flush  3 mL Intravenous Q12H   Continuous Infusions: . sodium chloride 75 mL/hr at 01/17/21 1507  . cefTRIAXone (ROCEPHIN)  IV 1 g (01/18/21 0016)     LOS: 2 days    Time spent: 30 mins    Wyvonnia Dusky, MD [facility] Pager [phone]  If 7PM-7AM, please contact night-coverage 01/18/2021, 8:04 AM

## 2021-01-19 LAB — CBC
HCT: 33.8 % — ABNORMAL LOW (ref 36.0–46.0)
Hemoglobin: 10.6 g/dL — ABNORMAL LOW (ref 12.0–15.0)
MCH: 27.4 pg (ref 26.0–34.0)
MCHC: 31.4 g/dL (ref 30.0–36.0)
MCV: 87.3 fL (ref 80.0–100.0)
Platelets: 227 10*3/uL (ref 150–400)
RBC: 3.87 MIL/uL (ref 3.87–5.11)
RDW: 15 % (ref 11.5–15.5)
WBC: 10 10*3/uL (ref 4.0–10.5)
nRBC: 0 % (ref 0.0–0.2)

## 2021-01-19 LAB — BASIC METABOLIC PANEL
Anion gap: 7 (ref 5–15)
BUN: 43 mg/dL — ABNORMAL HIGH (ref 8–23)
CO2: 25 mmol/L (ref 22–32)
Calcium: 8.9 mg/dL (ref 8.9–10.3)
Chloride: 109 mmol/L (ref 98–111)
Creatinine, Ser: 1.56 mg/dL — ABNORMAL HIGH (ref 0.44–1.00)
GFR, Estimated: 33 mL/min — ABNORMAL LOW (ref >60)
Glucose, Bld: 94 mg/dL (ref 70–99)
Potassium: 3.7 mmol/L (ref 3.5–5.1)
Sodium: 141 mmol/L (ref 135–145)

## 2021-01-19 LAB — GLUCOSE, CAPILLARY
Glucose-Capillary: 104 mg/dL — ABNORMAL HIGH (ref 70–99)
Glucose-Capillary: 86 mg/dL (ref 70–99)

## 2021-01-19 MED ORDER — CIPROFLOXACIN HCL 500 MG PO TABS: 500.0000 mg | ORAL_TABLET | Freq: Two times a day (BID) | ORAL | 0 refills | Status: AC

## 2021-01-19 NOTE — TOC Transition Note (Addendum)
Transition of Care [facility]) - CM/SW Discharge Note   Patient Details  Name: Sarah [name] MRN: [id number] Date of Birth: Dec 13, 1940  Transition [facility] Care [facility]) CM/SW Contact:  Sarah Price, RN Phone Number: 01/19/2021, 1:03 PM   Clinical Narrative:  Patient being discharge home with [facility] already seeing patient. Contacted Triage number and April called back and accepted patient and will send RN to see patient later today. Non-emergent transport arranged. Facesheet and Med. Nec form to Unit RN. Patient is a Full Code and this was relayed to [facility] and placed on Med. Nec.form. Communicated info with Unit RN via secure chat. Sarah Davies RN CM      Final next level of care: [place]     Patient Goals and CMS Choice        Discharge Placement                Patient to be transferred to facility by: acems Name of family member notified: Sarah [name] (grandson) Patient and family notified of of transfer: 01/19/21  Discharge Plan and Services                            [place]: Other - See comment (Established patient with [facility]/April accepted pt. today to resume care.) Date [facility] Agency Contacted: 01/19/21 Time HH Agency Contacted: 1200 Representative spoke with at [place]: April via [place].  Social Determinants of Health (SDOH) Interventions     Readmission Risk Interventions No flowsheet data found.

## 2021-01-19 NOTE — Discharge Summary (Signed)
Physician Discharge Summary  Sarah [name] TGG:[id number] DOB: Jun 19, 1941 DOA: 01/15/2021  PCP: Jerrol Banana., MD  Admit date: 01/15/2021 Discharge date: 01/19/2021  Admitted From: home  Disposition:  Home w/ hospice   Recommendations for Outpatient Follow-up:  1. Follow up with hospice provider in 1-3 days  Home Health: no  Equipment/Devices:  Discharge Condition: stable  CODE STATUS: full  Diet recommendation: Dysphagia III diet   Brief/Interim Summary: HPI was taken from Dr. Damita Dunnings: Sarah [name] is a 80 y.o. female with medical history significant for Dementia, CKD 3B in-home hospice but still a full code, arthritis who was brought to the emergency room after an episode at home in which she slumped forward in her wheelchair and was drooling.  The episode lasted just a few minutes and she started coming around.  She was not previously ill and has had no cough, fever or chills, no nausea or vomiting or diarrhea and no complaints of chest pain or shortness of breath.  She was previously in her usual state of health. ED course: On arrival BP 176/81, pulse 77, respirations 11 with O2 sat 96% on room air and temperature 97.6 blood work significant for WBC 11,000, creatinine 1.86 which is her baseline, BUN 55.  Blood glucose 189.  Troponin was 212/194.  Urinalysis with few bacteria and large leukocyte esterase. EKG as interpreted by me: Sinus rhythm at 81 with no acute ST-T wave changes Imaging: Chest x-ray pending  Patient started on Rocephin.  Son at bedside wants patient to receive the full spectrum of care.  Hospitalist consulted for admission.   Hospital course from Dr. Jimmye Norman 3/23-3/26/22: Pt presented after a syncopal episode of unknown etiology, possibly secondary to UTI. Urine cx grew e. coli and pt received IV abxs while inpatient and will pt switched to po cipro at d/c. CXR showed no active disease. Pt will return home w/ family care & hospice care. Pt was previously on  hospice at home but full code.   Discharge Diagnoses:  Principal Problem:   Syncope Active Problems:   Arthritis, degenerative   Dementia (HCC)   Underweight   Hospice care   Elevated troponin   UTI (urinary tract infection)   Stage 3b chronic kidney disease (HCC)   Protein-calorie malnutrition, severe  Syncope: etiology unclear, possibly secondary to UTI. Continue on tele. CXR shows no active disease. Echo shows EF 70-35%, grade I diastolic dysfunction, severe LV hypertrophy, no wall motion abnormality and no atrial level shunts detected.   Elevated troponin: likely secondary to demand ischemia   UTI: urine cx growing e. coli. Switched to IV cefazolin to narrow abx   Hypokalemia: KCl given. Will continue to monitor   Arthritis: etiology unclear, OA vs RA. Continue w/ supportive care  Dementia: continue w/ hospice care   Underweight: BMI 19.0. Continue w/ hospice care   CKDIIIb: Cr continues to trend down daily   Severe protein calorie malnutrition: continue w/ nutritional supplements     Discharge Instructions  Discharge Instructions    Diet - low sodium heart healthy   Complete by: As directed    Dysphagia III diet   Discharge instructions   Complete by: As directed    F/u w/ hospice provider in 1-3 days   Increase activity slowly   Complete by: As directed      Allergies as of 01/19/2021      Reactions   Penicillins Hives, Other (See Comments)   Has patient had a PCN reaction  causing immediate rash, facial/tongue/throat swelling, SOB or lightheadedness with hypotension: No Has patient had a PCN reaction causing severe rash involving mucus membranes or skin necrosis: No Has patient had a PCN reaction that required hospitalization: No Has patient had a PCN reaction occurring within the last 10 years: No  If all of the above answers are "NO", then may proceed with Cephalosporin use.      Medication List    TAKE these medications   acetaminophen 500  MG tablet Commonly known as: TYLENOL Take 500-1,000 mg by mouth every 6 (six) hours as needed for mild pain.   ciprofloxacin 500 MG tablet Commonly known as: Cipro Take 1 tablet (500 mg total) by mouth 2 (two) times daily for 4 days.   diclofenac Sodium 1 % Gel Commonly known as: VOLTAREN Apply 2 g topically every other day.   lisinopril 20 MG tablet Commonly known as: ZESTRIL Take 1 tablet by mouth once daily What changed: additional instructions   liver oil-zinc oxide 40 % ointment Commonly known as: DESITIN Apply 1 application topically as needed for irritation.   QC Gentle Laxative 10 MG suppository Generic drug: bisacodyl Place 10 mg rectally daily as needed.   Sorbitol 70 % Soln TAKE 30MLS BY MOUTH EVERY 2 HOURS UNTIL BOWEL MOVEMENT AS DIRECTED       Allergies  Allergen Reactions  . Penicillins Hives and Other (See Comments)    Has patient had a PCN reaction causing immediate rash, facial/tongue/throat swelling, SOB or lightheadedness with hypotension: No Has patient had a PCN reaction causing severe rash involving mucus membranes or skin necrosis: No Has patient had a PCN reaction that required hospitalization: No Has patient had a PCN reaction occurring within the last 10 years: No  If all of the above answers are "NO", then may proceed with Cephalosporin use.     Consultations:     Procedures/Studies: Portable Chest 1 View  Result Date: 01/16/2021 CLINICAL DATA:  80 year old female with unresponsiveness. EXAM: PORTABLE CHEST 1 VIEW COMPARISON:  Chest radiograph dated 10/04/2014 CT dated 04/07/2017. FINDINGS: Background of emphysema. No focal consolidation, pleural effusion or pneumothorax. The cardiac silhouette is within limits. Atherosclerotic calcification of the aorta. No acute osseous pathology. IMPRESSION: No active disease. Electronically Signed   By: Anner Crete M.D.   On: 01/16/2021 03:37   ECHOCARDIOGRAM COMPLETE  Result Date: 01/17/2021     ECHOCARDIOGRAM REPORT   Patient Name:   Sarah [name] Date of Exam: 01/16/2021 Medical Rec #:  [id number]     Height:       56.0 in Accession #:    [id number]    Weight:       81.8 lb Date of Birth:  Oct 10, 1941     BSA:          1.214 m Patient Age:    80 years      BP:           136/73 mmHg Patient Gender: F             HR:           70 bpm. Exam Location:  [facility] Procedure: 2D Echo, Cardiac Doppler and Color Doppler Indications:     R55 Syncope  History:         Patient has no prior history of Echocardiogram examinations.                  Risk Factors:Hypertension and Dyslipidemia. Dementia.  Sonographer:  Wilford Sports Rodgers-Jones Referring Phys:  [id number] Athena Masse Diagnosing Phys: Ida Rogue MD IMPRESSIONS  1. Left ventricular ejection fraction, by estimation, is 60 to 65%. The left ventricle has normal function. The left ventricle has no regional wall motion abnormalities. There is severe concentric left ventricular hypertrophy. Left ventricular diastolic  parameters are consistent with Grade I diastolic dysfunction (impaired relaxation).  2. Right ventricular systolic function is normal. The right ventricular size is normal. FINDINGS  Left Ventricle: Left ventricular ejection fraction, by estimation, is 60 to 65%. The left ventricle has normal function. The left ventricle has no regional wall motion abnormalities. The left ventricular internal cavity size was normal in size. There is  severe concentric left ventricular hypertrophy. Left ventricular diastolic parameters are consistent with Grade I diastolic dysfunction (impaired relaxation). Right Ventricle: The right ventricular size is normal. No increase in right ventricular wall thickness. Right ventricular systolic function is normal. Left Atrium: Left atrial size was normal in size. Right Atrium: Right atrial size was normal in size. Pericardium: There is no evidence of pericardial effusion. Mitral Valve: The mitral valve is normal in structure. No  evidence of mitral valve regurgitation. No evidence of mitral valve stenosis. Tricuspid Valve: The tricuspid valve is normal in structure. Tricuspid valve regurgitation is not demonstrated. No evidence of tricuspid stenosis. Aortic Valve: The aortic valve is normal in structure. Aortic valve regurgitation is not visualized. Mild to moderate aortic valve sclerosis/calcification is present, without any evidence of aortic stenosis. Pulmonic Valve: The pulmonic valve was normal in structure. Pulmonic valve regurgitation is not visualized. No evidence of pulmonic stenosis. Aorta: The aortic root is normal in size and structure. Venous: The inferior vena cava is normal in size with greater than 50% respiratory variability, suggesting right atrial pressure of 3 mmHg. IAS/Shunts: No atrial level shunt detected by color flow Doppler.  LEFT VENTRICLE PLAX 2D LVIDd:         3.68 cm  Diastology LVIDs:         2.17 cm  LV e' medial:    3.15 cm/s LV PW:         1.19 cm  LV E/e' medial:  12.6 LV IVS:        1.19 cm  LV e' lateral:   3.05 cm/s LVOT diam:     2.10 cm  LV E/e' lateral: 13.0 LV SV:         64 LV SV Index:   53 LVOT Area:     3.46 cm  RIGHT VENTRICLE             IVC RV S prime:     18.20 cm/s  IVC diam: 0.77 cm TAPSE (M-mode): 1.1 cm LEFT ATRIUM             Index LA diam:        3.60 cm 2.97 cm/m LA Vol (A2C):   23.4 ml 19.28 ml/m LA Vol (A4C):   40.8 ml 33.61 ml/m LA Biplane Vol: 31.0 ml 25.54 ml/m  AORTIC VALVE LVOT Vmax:   101.00 cm/s LVOT Vmean:  61.900 cm/s LVOT VTI:    0.186 m  AORTA Ao Root diam: 3.40 cm MITRAL VALVE               TRICUSPID VALVE MV Area (PHT): 2.56 cm    TR Peak grad:   20.8 mmHg MV Decel Time: 296 msec    TR Vmax:        228.00 cm/s MV E velocity:  39.80 cm/s MV A velocity: 98.50 cm/s  SHUNTS MV E/A ratio:  0.40        Systemic VTI:  0.19 m                            Systemic Diam: 2.10 cm Ida Rogue MD Electronically signed by Ida Rogue MD Signature Date/Time: 01/17/2021/12:04:17  PM    Final       Subjective: Pt denies any complaints. Pt is oriented to self only    Discharge Exam: Vitals:   01/19/21 0437 01/19/21 0824  BP: (!) 162/97 (!) 152/83  Pulse: 73 77  Resp: 16 19  Temp: 98.2 F (36.8 C) 97.7 F (36.5 C)  SpO2: 98% 99%   Vitals:   01/18/21 2100 01/19/21 0437 01/19/21 0824 01/19/21 0900  BP: (!) 152/80 (!) 162/97 (!) 152/83   Pulse: 75 73 77   Resp: 16 16 19    Temp: 98.1 F (36.7 C) 98.2 F (36.8 C) 97.7 F (36.5 C)   TempSrc: Oral Oral    SpO2: 97% 98% 99%   Weight:    39.2 kg  Height:        General: Pt is alert, awake, not in acute distress. Frail appearing  Cardiovascular: S1/S2 +, no rubs, no gallops Respiratory: CTA bilaterally, no wheezing, no rhonchi Abdominal: Soft, NT, ND, bowel sounds + Extremities: no edema, no cyanosis    The results of significant diagnostics from this hospitalization (including imaging, microbiology, ancillary and laboratory) are listed below for reference.     Microbiology: Recent Results (from the past 240 hour(s))  Urine culture     Status: Abnormal   Collection Time: 01/16/21 12:00 AM   Specimen: Urine, Random  Result Value Ref Range Status   Specimen Description   Final    URINE, RANDOM Performed at [facility], [address] [place], [place] [address]    Special Requests   Final    NONE Performed at [facility], [place], [place] [address]    Culture >=100,000 COLONIES/mL ESCHERICHIA COLI (A)  Final   Report Status 01/18/2021 FINAL  Final   Organism ID, Bacteria ESCHERICHIA COLI (A)  Final      Susceptibility   Escherichia coli - MIC*    AMPICILLIN <=2 SENSITIVE Sensitive     CEFAZOLIN <=4 SENSITIVE Sensitive     CEFEPIME <=0.12 SENSITIVE Sensitive     CEFTRIAXONE <=0.25 SENSITIVE Sensitive     CIPROFLOXACIN <=0.25 SENSITIVE Sensitive     GENTAMICIN <=1 SENSITIVE Sensitive     IMIPENEM <=0.25 SENSITIVE Sensitive     NITROFURANTOIN <=16  SENSITIVE Sensitive     TRIMETH/SULFA <=20 SENSITIVE Sensitive     AMPICILLIN/SULBACTAM <=2 SENSITIVE Sensitive     PIP/TAZO <=4 SENSITIVE Sensitive     * >=100,000 COLONIES/mL ESCHERICHIA COLI  SARS CORONAVIRUS 2 (TAT 6-24 HRS) Nasopharyngeal Nasopharyngeal Swab     Status: None   Collection Time: 01/16/21  3:49 AM   Specimen: Nasopharyngeal Swab  Result Value Ref Range Status   SARS Coronavirus 2 NEGATIVE NEGATIVE Final    Comment: (NOTE) SARS-CoV-2 target nucleic acids are NOT DETECTED.  The SARS-CoV-2 RNA is generally detectable in upper and lower respiratory specimens during the acute phase of infection. Negative results do not preclude SARS-CoV-2 infection, do not rule out co-infections with other pathogens, and should not be used as the sole basis for treatment or other patient management decisions. Negative  results must be combined with clinical observations, patient history, and epidemiological information. The expected result is Negative.  Fact Sheet for Patients: SugarRoll.be  Fact Sheet for Healthcare Providers: https://www.woods-mathews.com/  This test is not yet approved or cleared by the [place] FDA and  has been authorized for detection and/or diagnosis of SARS-CoV-2 by FDA under an Emergency Use Authorization (EUA). This EUA will remain  in effect (meaning this test can be used) for the duration of the COVID-19 declaration under Se ction 564(b)(1) of the Act, 21 U.S.C. section 360bbb-3(b)(1), unless the authorization is terminated or revoked sooner.  Performed at [facility], [place] [address], [place], [place] [address]      Labs: BNP (last 3 results) No results for input(s): BNP in the last 8760 hours. Basic Metabolic Panel: Recent Labs  Lab 01/15/21 2312 01/17/21 0452 01/18/21 0506 01/19/21 0402  NA 139 144 139 141  K 3.9 3.5 3.2* 3.7  CL 106 115* 112* 109  CO2 17* 16* 20* 25  GLUCOSE 189* 74  125* 94  BUN 55* 50* 45* 43*  CREATININE 1.86* 1.83* 1.80* 1.56*  CALCIUM 9.9 8.6* 8.8* 8.9   Liver Function Tests: No results for input(s): AST, ALT, ALKPHOS, BILITOT, PROT, ALBUMIN in the last 168 hours. No results for input(s): LIPASE, AMYLASE in the last 168 hours. No results for input(s): AMMONIA in the last 168 hours. CBC: Recent Labs  Lab 01/15/21 2312 01/17/21 0452 01/18/21 0506 01/19/21 0402  WBC 11.5* 8.2 10.0 10.0  NEUTROABS 9.0*  --   --   --   HGB 12.7 10.4* 10.1* 10.6*  HCT 40.5 32.7* 31.6* 33.8*  MCV 87.3 86.5 85.6 87.3  PLT 289 189 215 227   Cardiac Enzymes: No results for input(s): CKTOTAL, CKMB, CKMBINDEX, TROPONINI in the last 168 hours. BNP: Invalid input(s): POCBNP CBG: Recent Labs  Lab 01/17/21 0443 01/17/21 0534 01/18/21 0530 01/19/21 0438 01/19/21 0821  GLUCAP 58* 241* 127* 104* 86   D-Dimer No results for input(s): DDIMER in the last 72 hours. Hgb A1c No results for input(s): HGBA1C in the last 72 hours. Lipid Profile No results for input(s): CHOL, HDL, LDLCALC, TRIG, CHOLHDL, LDLDIRECT in the last 72 hours. Thyroid function studies No results for input(s): TSH, T4TOTAL, T3FREE, THYROIDAB in the last 72 hours.  Invalid input(s): FREET3 Anemia work up No results for input(s): VITAMINB12, FOLATE, FERRITIN, TIBC, IRON, RETICCTPCT in the last 72 hours. Urinalysis    Component Value Date/Time   COLORURINE YELLOW (A) 01/16/2021 0000   APPEARANCEUR CLOUDY (A) 01/16/2021 0000   LABSPEC 1.013 01/16/2021 0000   PHURINE 5.0 01/16/2021 0000   GLUCOSEU NEGATIVE 01/16/2021 0000   HGBUR MODERATE (A) 01/16/2021 0000   BILIRUBINUR NEGATIVE 01/16/2021 0000   BILIRUBINUR negaitve 05/29/2020 0932   KETONESUR 5 (A) 01/16/2021 0000   PROTEINUR 100 (A) 01/16/2021 0000   UROBILINOGEN 0.2 05/29/2020 0932   NITRITE NEGATIVE 01/16/2021 0000   LEUKOCYTESUR LARGE (A) 01/16/2021 0000   Sepsis Labs Invalid input(s): PROCALCITONIN,  WBC,   LACTICIDVEN Microbiology Recent Results (from the past 240 hour(s))  Urine culture     Status: Abnormal   Collection Time: 01/16/21 12:00 AM   Specimen: Urine, Random  Result Value Ref Range Status   Specimen Description   Final    URINE, RANDOM Performed at [facility], [address], [place], [place] [address]    Special Requests   Final    NONE Performed at [facility], [address] [place], [place] [address]    Culture >=100,000 COLONIES/mL ESCHERICHIA COLI (A)  Final   Report Status 01/18/2021 FINAL  Final   Organism ID, Bacteria ESCHERICHIA COLI (A)  Final      Susceptibility   Escherichia coli - MIC*    AMPICILLIN <=2 SENSITIVE Sensitive     CEFAZOLIN <=4 SENSITIVE Sensitive     CEFEPIME <=0.12 SENSITIVE Sensitive     CEFTRIAXONE <=0.25 SENSITIVE Sensitive     CIPROFLOXACIN <=0.25 SENSITIVE Sensitive     GENTAMICIN <=1 SENSITIVE Sensitive     IMIPENEM <=0.25 SENSITIVE Sensitive     NITROFURANTOIN <=16 SENSITIVE Sensitive     TRIMETH/SULFA <=20 SENSITIVE Sensitive     AMPICILLIN/SULBACTAM <=2 SENSITIVE Sensitive     PIP/TAZO <=4 SENSITIVE Sensitive     * >=100,000 COLONIES/mL ESCHERICHIA COLI  SARS CORONAVIRUS 2 (TAT 6-24 HRS) Nasopharyngeal Nasopharyngeal Swab     Status: None   Collection Time: 01/16/21  3:49 AM   Specimen: Nasopharyngeal Swab  Result Value Ref Range Status   SARS Coronavirus 2 NEGATIVE NEGATIVE Final    Comment: (NOTE) SARS-CoV-2 target nucleic acids are NOT DETECTED.  The SARS-CoV-2 RNA is generally detectable in upper and lower respiratory specimens during the acute phase of infection. Negative results do not preclude SARS-CoV-2 infection, do not rule out co-infections with other pathogens, and should not be used as the sole basis for treatment or other patient management decisions. Negative results must be combined with clinical observations, patient history, and epidemiological information. The expected result is  Negative.  Fact Sheet for Patients: SugarRoll.be  Fact Sheet for Healthcare Providers: https://www.woods-mathews.com/  This test is not yet approved or cleared by the [place] FDA and  has been authorized for detection and/or diagnosis of SARS-CoV-2 by FDA under an Emergency Use Authorization (EUA). This EUA will remain  in effect (meaning this test can be used) for the duration of the COVID-19 declaration under Se ction 564(b)(1) of the Act, 21 U.S.C. section 360bbb-3(b)(1), unless the authorization is terminated or revoked sooner.  Performed at [facility], [place] [address], [place], [place] [address]      Time coordinating discharge: Over 30 minutes  SIGNED:   Wyvonnia Dusky, MD  [facility] 01/19/2021, 11:03 AM Pager   If 7PM-7AM, please contact night-coverage

## 2021-01-21 ENCOUNTER — Ambulatory Visit: Payer: Self-pay

## 2021-01-21 ENCOUNTER — Telehealth: Payer: Self-pay

## 2021-01-21 DIAGNOSIS — N183 Chronic kidney disease, stage 3 unspecified: Secondary | ICD-10-CM | POA: Diagnosis not present

## 2021-01-21 DIAGNOSIS — F0281 Dementia in other diseases classified elsewhere with behavioral disturbance: Secondary | ICD-10-CM

## 2021-01-21 DIAGNOSIS — F02818 Dementia in other diseases classified elsewhere, unspecified severity, with other behavioral disturbance: Secondary | ICD-10-CM

## 2021-01-21 DIAGNOSIS — G301 Alzheimer's disease with late onset: Secondary | ICD-10-CM | POA: Diagnosis not present

## 2021-01-21 NOTE — Chronic Care Management (AMB) (Signed)
  Chronic Care Management   Note  01/21/2021 Name: Sarah [name] MRN: [id number] DOB: 15-Jul-1941   Primary Care Provide: Jerrol Banana., MD Reason for referral : Chronic Care Management   Brief outreach with Mrs. Teal's son today. Reports she has transitioned to [facility]. She is currently receiving nursing visits once a week. Receives assistance from a nursing aide three times a week. He declined need for further outreach. Agreed to contact the care management team if additional assistance is required.   Follow up plan: Mrs. Stencil son will contact the care management team if additional outreach is needed. The team will gladly assist.    [facility] [phone]

## 2021-01-21 NOTE — Telephone Encounter (Signed)
  Patient was recently discharged from the hospital on 01/19/21.  No TCM completed, patient does not qualify for TCM services due to being d/c home with hospice.  Per discharge summary patient needs follow up with hospice provider. FYI!

## 2021-01-22 ENCOUNTER — Telehealth: Payer: Self-pay

## 2021-01-22 NOTE — Telephone Encounter (Signed)
Called hospice to speak with nursing staff about getting a DNR placed for the patient. Left message to call back.

## 2021-02-05 NOTE — Telephone Encounter (Signed)
Dr. Rosanna Randy, I was unable to get in contact with hospice staff about this patient. Should I call the family/grandson? Or what should I do next?

## 2021-04-14 ENCOUNTER — Other Ambulatory Visit (INDEPENDENT_AMBULATORY_CARE_PROVIDER_SITE_OTHER): Payer: Self-pay | Admitting: Nurse Practitioner

## 2021-04-14 DIAGNOSIS — K551 Chronic vascular disorders of intestine: Secondary | ICD-10-CM

## 2021-04-14 DIAGNOSIS — I701 Atherosclerosis of renal artery: Secondary | ICD-10-CM

## 2021-04-14 DIAGNOSIS — I714 Abdominal aortic aneurysm, without rupture, unspecified: Secondary | ICD-10-CM

## 2021-04-22 ENCOUNTER — Encounter (INDEPENDENT_AMBULATORY_CARE_PROVIDER_SITE_OTHER): Payer: Medicare Other

## 2021-04-22 ENCOUNTER — Ambulatory Visit (INDEPENDENT_AMBULATORY_CARE_PROVIDER_SITE_OTHER): Payer: Medicare Other | Admitting: Nurse Practitioner

## 2021-07-26 ENCOUNTER — Telehealth: Payer: Self-pay | Admitting: Family Medicine

## 2021-07-26 NOTE — Telephone Encounter (Signed)
Please review

## 2021-07-26 NOTE — Telephone Encounter (Signed)
Christiane Ha, nurse calling from [facility], calling stating that the pt passed this morning at 9:09 am. She states that she is needing to make PCP aware. Please advise.      [phone]

## 2021-07-29 ENCOUNTER — Telehealth: Payer: Self-pay | Admitting: Family Medicine

## 2021-07-29 NOTE — Telephone Encounter (Signed)
Sarah [name] is calling [facility] is calling to see if [name] would sign the death certificate for Dr. [name] patient.  Pt passed away 08-15-21.  [phone]

## 2021-07-30 ENCOUNTER — Telehealth: Payer: Self-pay | Admitting: *Deleted

## 2021-07-30 NOTE — Telephone Encounter (Signed)
Copied from [place] [id number]. Topic: General - Other >> Jul 30, 2021  8:47 AM Celene Kras wrote: Reason for CRM: Pts grandson calling and is requesting to speak with pts PCP regarding pt. He states that he would just like to Thank Him and talk to him about pts final months in hospice. Please advise.

## 2021-07-30 NOTE — Telephone Encounter (Signed)
Funeral home following up on death certificate, caller stated the case # is [id number].

## 2021-07-30 NOTE — Telephone Encounter (Signed)
Dr Rosanna Randy will do this.  I will notify funeral home

## 2021-07-30 NOTE — Telephone Encounter (Signed)
Case # given to Dr Darnell Level

## 2022-08-25 ENCOUNTER — Encounter (INDEPENDENT_AMBULATORY_CARE_PROVIDER_SITE_OTHER): Payer: Self-pay
